# Patient Record
Sex: Male | Born: 1980
Health system: Southern US, Community
[De-identification: ages and names within clinical notes are randomized; demographics above are authoritative.]

## PROBLEM LIST (undated history)

## (undated) DIAGNOSIS — R7303 Prediabetes: Secondary | ICD-10-CM

## (undated) DIAGNOSIS — N63 Unspecified lump in unspecified breast: Secondary | ICD-10-CM

## (undated) DIAGNOSIS — M1990 Unspecified osteoarthritis, unspecified site: Secondary | ICD-10-CM

## (undated) DIAGNOSIS — Z9049 Acquired absence of other specified parts of digestive tract: Secondary | ICD-10-CM

## (undated) DIAGNOSIS — I10 Essential (primary) hypertension: Secondary | ICD-10-CM

## (undated) DIAGNOSIS — F410 Panic disorder [episodic paroxysmal anxiety] without agoraphobia: Secondary | ICD-10-CM

## (undated) DIAGNOSIS — G4730 Sleep apnea, unspecified: Secondary | ICD-10-CM

## (undated) HISTORY — DX: Prediabetes: R73.03

## (undated) HISTORY — DX: Acquired absence of other specified parts of digestive tract: Z90.49

## (undated) HISTORY — DX: Essential (primary) hypertension: I10

## (undated) HISTORY — DX: Panic disorder (episodic paroxysmal anxiety): F41.0

## (undated) HISTORY — DX: Unspecified osteoarthritis, unspecified site: M19.90

---
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
## 2009-01-17 ENCOUNTER — Emergency Department (HOSPITAL_COMMUNITY): Admission: EM | Admit: 2009-01-17 | Discharge: 2009-01-17 | Payer: Self-pay | Admitting: Emergency Medicine

## 2009-12-18 ENCOUNTER — Emergency Department (HOSPITAL_COMMUNITY): Admission: EM | Admit: 2009-12-18 | Discharge: 2009-12-18 | Payer: Self-pay | Admitting: Family Medicine

## 2010-11-04 LAB — POCT I-STAT, CHEM 8
BUN: 11 mg/dL (ref 6–23)
Calcium, Ion: 1.16 mmol/L (ref 1.12–1.32)
Creatinine, Ser: 1.1 mg/dL (ref 0.4–1.5)
Glucose, Bld: 137 mg/dL — ABNORMAL HIGH (ref 70–99)
TCO2: 28 mmol/L (ref 0–100)

## 2015-10-16 ENCOUNTER — Ambulatory Visit (INDEPENDENT_AMBULATORY_CARE_PROVIDER_SITE_OTHER): Payer: Managed Care, Other (non HMO) | Admitting: Family Medicine

## 2015-10-16 VITALS — BP 138/92 | HR 103 | Temp 98.3°F | Resp 17 | Ht 73.0 in | Wt 257.0 lb

## 2015-10-16 DIAGNOSIS — K5900 Constipation, unspecified: Secondary | ICD-10-CM | POA: Diagnosis not present

## 2015-10-16 DIAGNOSIS — Z6833 Body mass index (BMI) 33.0-33.9, adult: Secondary | ICD-10-CM | POA: Diagnosis not present

## 2015-10-16 DIAGNOSIS — Z9049 Acquired absence of other specified parts of digestive tract: Secondary | ICD-10-CM | POA: Diagnosis not present

## 2015-10-16 LAB — COMPREHENSIVE METABOLIC PANEL
ALBUMIN: 4.5 g/dL (ref 3.6–5.1)
ALT: 25 U/L (ref 9–46)
AST: 24 U/L (ref 10–40)
Alkaline Phosphatase: 59 U/L (ref 40–115)
BUN: 11 mg/dL (ref 7–25)
CHLORIDE: 105 mmol/L (ref 98–110)
CO2: 29 mmol/L (ref 20–31)
CREATININE: 1.18 mg/dL (ref 0.60–1.35)
Calcium: 9.8 mg/dL (ref 8.6–10.3)
Glucose, Bld: 93 mg/dL (ref 65–99)
POTASSIUM: 4.2 mmol/L (ref 3.5–5.3)
SODIUM: 141 mmol/L (ref 135–146)
Total Bilirubin: 0.4 mg/dL (ref 0.2–1.2)
Total Protein: 7.9 g/dL (ref 6.1–8.1)

## 2015-10-16 LAB — CBC
HEMATOCRIT: 40.2 % (ref 39.0–52.0)
HEMOGLOBIN: 13.8 g/dL (ref 13.0–17.0)
MCH: 29.1 pg (ref 26.0–34.0)
MCHC: 34.3 g/dL (ref 30.0–36.0)
MCV: 84.8 fL (ref 78.0–100.0)
MPV: 9.2 fL (ref 8.6–12.4)
PLATELETS: 361 10*3/uL (ref 150–400)
RBC: 4.74 MIL/uL (ref 4.22–5.81)
RDW: 14.1 % (ref 11.5–15.5)
WBC: 8.3 10*3/uL (ref 4.0–10.5)

## 2015-10-16 LAB — TSH: TSH: 1.18 m[IU]/L (ref 0.40–4.50)

## 2015-10-16 NOTE — Progress Notes (Signed)
   Subjective:    Patient ID: Joel Marshall, male    DOB: 11-16-1980, 35 y.o.   MRN: [id number]  HPI This is a pleasant 35 year old male that presents today with abdomen pain and headaches since Friday 2/24. Last full BM was also Friday (2/24). He has had small BM's in between till now. He tried colace (saturday and Sunday, 6 pills total) and prune juice with little relief. He also tried Miralax in poweraid with no relief. He tried one 500mg  of tylenol last Saturday with little relief. Appetite has not changed. He reports that he has had 2/3 of his intestine removed when he was [age] years old- states that he [place] unsure why. Reports having these symptoms once last year around May- went to [facility] for Miralax. He has a moderate BM this morning. Denies n/v, chest pain, or SOB.   History reviewed. No pertinent past medical history. No family history on file. Social History   Social History  . Marital Status: Married    Spouse Name: N/A  . Number of Children: N/A  . Years of Education: N/A   Occupational History  . Not on file.   Social History Main Topics  . Smoking status: Never Smoker   . Smokeless tobacco: Not on file  . Alcohol Use: No  . Drug Use: No  . Sexual Activity: No   Other Topics Concern  . Not on file   Social History Narrative  . No narrative on file    Review of Systems  Constitutional: Negative for fever, activity change, appetite change and fatigue.  HENT: Negative for congestion, rhinorrhea, sinus pressure, sneezing and sore throat.   Respiratory: Negative for cough, chest tightness, shortness of breath and wheezing.   Cardiovascular: Negative for chest pain and palpitations.  Gastrointestinal: Positive for abdominal pain (ocassionally), constipation and abdominal distention (little). Negative for nausea and vomiting.  Neurological: Positive for headaches (started last friday 2/24- some eased off some).       Objective:   Physical Exam  Constitutional: He is  oriented to person, place, and time. He appears well-developed and well-nourished.  HENT:  Head: Normocephalic.  Neck: Normal range of motion.  Cardiovascular: Normal rate, regular rhythm and normal heart sounds.   Pulmonary/Chest: Breath sounds normal. No respiratory distress. He has no wheezes.  Abdominal: Soft. He exhibits distension. There is tenderness (small, left side).  Neurological: He is alert and oriented to person, place, and time.  Skin: Skin is warm and dry.  Psychiatric: He has a normal mood and affect. His behavior is normal. Judgment and thought content normal.         BP 138/92 mmHg  Pulse 103  Temp(Src) 98.3 F (36.8 C) (Oral)  Resp 17  Ht 6\' 1"  (1.854 m)  Wt 257 lb (116.574 kg)  BMI 33.91 kg/m2  SpO2 94%  Assessment & Plan:

## 2015-10-16 NOTE — Patient Instructions (Addendum)
For acute constipation- Take 4 Dulcolax. Wait 1 hour then take 4-8 doses of Miralax over 2-3 hours For maintenance use Colace (stool softener, generic fine) and Miralax 1-3 doses daily until you are regulated Add a probiotic daily  If you have had adequate bowel movement in 24 hours, please return to clinic  Constipation, Adult Constipation is when a person has fewer than three bowel movements a week, has difficulty having a bowel movement, or has stools that are dry, hard, or larger than normal. As people grow older, constipation is more common. A low-fiber diet, not taking in enough fluids, and taking certain medicines may make constipation worse.  CAUSES   Certain medicines, such as antidepressants, pain medicine, iron supplements, antacids, and water pills.   Certain diseases, such as diabetes, irritable bowel syndrome (IBS), thyroid disease, or depression.   Not drinking enough water.   Not eating enough fiber-rich foods.   Stress or travel.   Lack of physical activity or exercise.   Ignoring the urge to have a bowel movement.   Using laxatives too much.  SIGNS AND SYMPTOMS   Having fewer than three bowel movements a week.   Straining to have a bowel movement.   Having stools that are hard, dry, or larger than normal.   Feeling full or bloated.   Pain in the lower abdomen.   Not feeling relief after having a bowel movement.  DIAGNOSIS  Your health care provider will take a medical history and perform a physical exam. Further testing may be done for severe constipation. Some tests may include:  A barium enema X-ray to examine your rectum, colon, and, sometimes, your small intestine.   A sigmoidoscopy to examine your lower colon.   A colonoscopy to examine your entire colon. TREATMENT  Treatment will depend on the severity of your constipation and what is causing it. Some dietary treatments include drinking more fluids and eating more fiber-rich foods.  Lifestyle treatments may include regular exercise. If these diet and lifestyle recommendations do not help, your health care provider may recommend taking over-the-counter laxative medicines to help you have bowel movements. Prescription medicines may be prescribed if over-the-counter medicines do not work.  HOME CARE INSTRUCTIONS   Eat foods that have a lot of fiber, such as fruits, vegetables, whole grains, and beans.  Limit foods high in fat and processed sugars, such as french fries, hamburgers, cookies, candies, and soda.   A fiber supplement may be added to your diet if you cannot get enough fiber from foods.   Drink enough fluids to keep your urine clear or pale yellow.   Exercise regularly or as directed by your health care provider.   Go to the restroom when you have the urge to go. Do not hold it.   Only take over-the-counter or prescription medicines as directed by your health care provider. Do not take other medicines for constipation without talking to your health care provider first.  [place] IF:   You have bright red blood in your stool.   Your constipation lasts for more than 4 days or gets worse.   You have abdominal or rectal pain.   You have thin, pencil-like stools.   You have unexplained weight loss. MAKE SURE YOU:   Understand these instructions.  Will watch your condition.  Will get help right away if you are not doing well or get worse.   This information is not intended to replace advice given to you by your  health care provider. Make sure you discuss any questions you have with your health care provider.   Document Released: 05/01/2004 Document Revised: 08/24/2014 Document Reviewed: 05/15/2013 Elsevier Interactive Patient Education [employer].    Why follow it? Research shows. . Those who follow the Mediterranean diet have a reduced risk of heart disease  . The diet is associated with a reduced incidence of  Parkinson's and Alzheimer's diseases . People following the diet may have longer life expectancies and lower rates of chronic diseases  . The Dietary Guidelines for Americans recommends the Mediterranean diet as an eating plan to promote health and prevent disease  What Is the Mediterranean Diet?  . Healthy eating plan based on typical foods and recipes of Mediterranean-style cooking . The diet is primarily a plant based diet; these foods should make up a majority of meals   Starches - Plant based foods should make up a majority of meals - They are an important sources of vitamins, minerals, energy, antioxidants, and fiber - Choose whole grains, foods high in fiber and minimally processed items  - Typical grain sources include wheat, oats, barley, corn, brown rice, bulgar, farro, millet, polenta, couscous  - Various types of beans include chickpeas, lentils, fava beans, black beans, white beans   Fruits  Veggies - Large quantities of antioxidant rich fruits & veggies; 6 or more servings  - Vegetables can be eaten raw or lightly drizzled with oil and cooked  - Vegetables common to the traditional Mediterranean Diet include: artichokes, arugula, beets, broccoli, brussel sprouts, cabbage, carrots, celery, collard greens, cucumbers, eggplant, kale, leeks, lemons, lettuce, mushrooms, okra, onions, peas, peppers, potatoes, pumpkin, radishes, rutabaga, shallots, spinach, sweet potatoes, turnips, zucchini - Fruits common to the Mediterranean Diet include: apples, apricots, avocados, cherries, clementines, dates, figs, grapefruits, grapes, melons, nectarines, oranges, peaches, pears, pomegranates, strawberries, tangerines  Fats - Replace butter and margarine with healthy oils, such as olive oil, canola oil, and tahini  - Limit nuts to no more than a handful a day  - Nuts include walnuts, almonds, pecans, pistachios, pine nuts  - Limit or avoid candied, honey roasted or heavily salted nuts - Olives are  central to the [employer] - can be eaten whole or used in a variety of dishes   Meats Protein - Limiting red meat: no more than a few times a month - When eating red meat: choose lean cuts and keep the portion to the size of deck of cards - Eggs: approx. 0 to 4 times a week  - Fish and lean poultry: at least 2 a week  - Healthy protein sources include, chicken, [place], lean beef, lamb - Increase intake of seafood such as tuna, salmon, trout, mackerel, shrimp, scallops - Avoid or limit high fat processed meats such as sausage and bacon  Dairy - Include moderate amounts of low fat dairy products  - Focus on healthy dairy such as fat free yogurt, skim milk, low or reduced fat cheese - Limit dairy products higher in fat such as whole or 2% milk, cheese, ice cream  Alcohol - Moderate amounts of red wine is ok  - No more than 5 oz daily for women (all ages) and men older than age 39  - No more than 10 oz of wine daily for men younger than 73  Other - Limit sweets and other desserts  - Use herbs and spices instead of salt to flavor foods  - Herbs and spices common to the traditional Mediterranean  Diet include: basil, bay leaves, chives, cloves, cumin, fennel, garlic, lavender, marjoram, mint, oregano, parsley, pepper, rosemary, sage, savory, sumac, tarragon, thyme   It's not just a diet, it's a lifestyle:  . The Mediterranean diet includes lifestyle factors typical of those in the region  . Foods, drinks and meals are best eaten with others and savored . Daily physical activity is important for overall good health . This could be strenuous exercise like running and aerobics . This could also be more leisurely activities such as walking, housework, yard-work, or taking the stairs . Moderation is the key; a balanced and healthy diet accommodates most foods and drinks . Consider portion sizes and frequency of consumption of certain foods   Meal Ideas & Options:  . Breakfast:  o Whole wheat  toast or whole wheat English muffins with peanut butter & hard boiled egg o Steel cut oats topped with apples & cinnamon and skim milk  o Fresh fruit: banana, strawberries, melon, berries, peaches  o Smoothies: strawberries, bananas, greek yogurt, peanut butter o Low fat greek yogurt with blueberries and granola  o Egg white omelet with spinach and mushrooms o Breakfast couscous: whole wheat couscous, apricots, skim milk, cranberries  . Sandwiches:  o Hummus and grilled vegetables (peppers, zucchini, squash) on whole wheat bread   o Grilled chicken on whole wheat pita with lettuce, tomatoes, cucumbers or tzatziki  o Tuna salad on whole wheat bread: tuna salad made with greek yogurt, olives, red peppers, capers, green onions o Garlic rosemary lamb pita: lamb sauted with garlic, rosemary, salt & pepper; add lettuce, cucumber, greek yogurt to pita - flavor with lemon juice and black pepper  . Seafood:  o Mediterranean grilled salmon, seasoned with garlic, basil, parsley, lemon juice and black pepper o Shrimp, lemon, and spinach whole-grain pasta salad made with low fat greek yogurt  o Seared scallops with lemon orzo  o Seared tuna steaks seasoned salt, pepper, coriander topped with tomato mixture of olives, tomatoes, olive oil, minced garlic, parsley, green onions and cappers  . Meats:  o Herbed greek chicken salad with kalamata olives, cucumber, feta  o Red bell peppers stuffed with spinach, bulgur, lean ground beef (or lentils) & topped with feta   o Kebabs: skewers of chicken, tomatoes, onions, zucchini, squash  o [place] burgers: made with red onions, mint, dill, lemon juice, feta cheese topped with roasted red peppers . Vegetarian o Cucumber salad: cucumbers, artichoke hearts, celery, red onion, feta cheese, tossed in olive oil & lemon juice  o Hummus and whole grain pita points with a greek salad (lettuce, tomato, feta, olives, cucumbers, red onion) o Lentil soup with celery, carrots made  with vegetable broth, garlic, salt and pepper  o Tabouli salad: parsley, bulgur, mint, scallions, cucumbers, tomato, radishes, lemon juice, olive oil, salt and pepper.

## 2015-10-16 NOTE — Progress Notes (Signed)
Subjective:    Patient ID: Joel Marshall, male    DOB: 1980/09/23, 35 y.o.   MRN: [id number]  HPI This is a pleasant 35 yo male who presents today with recurrent constipation. He does not have regular medical care. He has felt constipated and had a headache for 5 days. He has had small amounts of stool with a "moderate" stool today. He had a previous episode for which he was seen at [facility] and treated with Miralax with relief. He has takes colace (6 tabs total) and 1 dose of MIralax. He has had intermittent abdominal pain and bloating. He had some nausea this morning that was relieved by eating a [employer] biscuit. He has not had any blood in stool, with wiping or dark stools. 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 He is not sure why. He requests referral to GI to discuss his history and for his constipation.   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 He was unemployed for 2 years and gained a lot of weight. He doesn't exercise regularly and eats fast food 1-2 times per day.   History reviewed. No pertinent past medical history. Past Surgical History  Procedure Laterality Date  . Small intestine surgery     No family history on file. Social History  Substance Use Topics  . Smoking status: Never Smoker   . Smokeless tobacco: None  . Alcohol Use: No   Review of Systems  Constitutional: Negative for fever, appetite change and fatigue.  Respiratory: Negative for cough and shortness of breath.   Cardiovascular: Negative for chest pain.  Gastrointestinal: Positive for nausea, abdominal pain, constipation and abdominal distention. Negative for vomiting, diarrhea, blood in stool and anal bleeding.  Neurological: Positive for headaches (improving).       Objective:   Physical Exam  Constitutional: He is oriented to person, place, and time. He appears well-developed and well-nourished. No distress.  HENT:  Head: Normocephalic and atraumatic.  Eyes: Conjunctivae are  normal.  Cardiovascular: Normal rate, regular rhythm and normal heart sounds.   Pulmonary/Chest: Effort normal and breath sounds normal.  Abdominal: Soft. Bowel sounds are normal. He exhibits distension (mild). There is tenderness (slight, right sided with deep palpation). There is no rebound and no guarding.  He has a long, well healed scar across his mid abdomen.   Musculoskeletal: Normal range of motion.  Neurological: He is alert and oriented to person, place, and time.  Skin: Skin is warm and dry. He is not diaphoretic.  Vitals reviewed.     BP 138/92 mmHg  Pulse 103  Temp(Src) 98.3 F (36.8 C) (Oral)  Resp 17  Ht 6\' 1"  (1.854 m)  Wt 257 lb (116.574 kg)  BMI 33.91 kg/m2  SpO2 94% Wt Readings from Last 3 Encounters:  10/16/15 257 lb (116.574 kg)   Depression screen PHQ 2/9 10/16/2015  Decreased Interest 0  Down, Depressed, Hopeless 0  PHQ - 2 Score 0         Assessment & Plan:  1. Constipation, unspecified constipation type - CBC - Comprehensive metabolic panel - TSH - Ambulatory referral to Gastroenterology- per patient request - Provided verbal and written instructions for dulcolax/miralax - encouraged increased water, fiber  2. History of intestine removal - Ambulatory referral to Gastroenterology- per patient request  3. BMI 33.0-33.9,adult - provided information about the Mediterranean Diet - encouraged avoidance of fast food, increased vegetables and fruits   Clarene Reamer, FNP-BC  [facility]  10/18/2015 8:56 AM

## 2015-10-18 ENCOUNTER — Encounter: Payer: Self-pay | Admitting: Internal Medicine

## 2015-10-18 ENCOUNTER — Encounter: Payer: Self-pay | Admitting: Family Medicine

## 2015-11-20 ENCOUNTER — Ambulatory Visit: Payer: Self-pay | Admitting: Internal Medicine

## 2016-01-21 ENCOUNTER — Ambulatory Visit (INDEPENDENT_AMBULATORY_CARE_PROVIDER_SITE_OTHER): Payer: Managed Care, Other (non HMO) | Admitting: Internal Medicine

## 2016-01-21 ENCOUNTER — Encounter: Payer: Self-pay | Admitting: Internal Medicine

## 2016-01-21 VITALS — BP 124/80 | HR 72 | Ht 72.5 in | Wt 250.0 lb

## 2016-01-21 DIAGNOSIS — R1030 Lower abdominal pain, unspecified: Secondary | ICD-10-CM | POA: Diagnosis not present

## 2016-01-21 DIAGNOSIS — K5909 Other constipation: Secondary | ICD-10-CM

## 2016-01-21 DIAGNOSIS — K219 Gastro-esophageal reflux disease without esophagitis: Secondary | ICD-10-CM

## 2016-01-21 NOTE — Progress Notes (Signed)
HISTORY OF PRESENT ILLNESS:  Joel Marshall is a 35 y.o. male who is self-referred with a chief complaint of "wanting to know what happened to me when I was a child". According to the patient he underwent abdominal surgery age 62. He was told by his parents that he had section of small intestines and the appendix removed. He states that they gave him no additional information. He denies meeting to have been seen by physicians as child thereafter. His only GI complaint is that of constipation once every 1-2 years. Sometimes associated with abdominal discomfort which is probably relieved when constipation is relieved. Recently tried MiraLAX with good results. Finally, he does have occasional indigestion and heartburn with dietary indiscretion. No dysphagia. Weight has been stable  REVIEW OF SYSTEMS:  All non-GI ROS negative except for sinus and allergy, sleeping problems  Past Medical History  Diagnosis Date  . History of intestine removal   . Borderline diabetes     Past Surgical History  Procedure Laterality Date  . Small intestine surgery      Social History Joel Marshall  reports that he has never smoked. He does not have any smokeless tobacco history on file. He reports that he does not drink alcohol or use illicit drugs.  family history includes Joel Marshall in his brother; Hypertension in his father; Rheum arthritis in his mother.  No Known Allergies     PHYSICAL EXAMINATION: Vital signs: BP 124/80 mmHg  Pulse 72  Ht 6' 0.5" (1.842 m)  Wt 250 lb (113.399 kg)  BMI 33.42 kg/m2  Constitutional: Doesn't, obese, generally well-appearing, no acute distress Psychiatric: alert and oriented x3, cooperative Eyes: extraocular movements intact, anicteric, conjunctiva pink Mouth: oral pharynx moist, no lesions Neck: supple no lymphadenopathy Cardiovascular: heart regular rate and rhythm, no murmur Lungs: clear to auscultation bilaterally Abdomen: soft, nontender, nondistended, no obvious ascites, no  peritoneal signs, normal bowel sounds, no organomegaly. Large transverse abdominal incision above the umbilicus well-healed without megaly Lymph: Rectal: Omitted obese, Extremities: no clubbing cyanosis or lower extremity edema bilaterally Skin: no lesions on visible extremities Neuro: No focal deficits. Cranial nerves intact  ASSESSMENT:  #1. Abdominal surgery as a child. Type unknown. No obvious sequelae #2. Infrequent constipation responding to laxatives #3. Infrequent abdominal pain associated with constipation believed with defecation #4. Mild GERD without alarm features #5. Obesity   PLAN:  #1. May not be possible as somebody years past, and advised him to reach out to the hospital that performed his surgery for more information is available #2. MiraLAX as needed #3. Reflux precautions with attention to weight loss #4. GI follow-up as needed. Resume general medical care with PCP Dr. Criss Rosales

## 2016-01-21 NOTE — Patient Instructions (Signed)
Please follow up as needed 

## 2016-04-03 DIAGNOSIS — K5901 Slow transit constipation: Secondary | ICD-10-CM | POA: Diagnosis not present

## 2016-04-03 DIAGNOSIS — Z6832 Body mass index (BMI) 32.0-32.9, adult: Secondary | ICD-10-CM | POA: Diagnosis not present

## 2016-04-03 DIAGNOSIS — E6609 Other obesity due to excess calories: Secondary | ICD-10-CM | POA: Diagnosis not present

## 2016-04-03 DIAGNOSIS — R7309 Other abnormal glucose: Secondary | ICD-10-CM | POA: Diagnosis not present

## 2016-08-07 DIAGNOSIS — N62 Hypertrophy of breast: Secondary | ICD-10-CM | POA: Diagnosis not present

## 2016-08-07 DIAGNOSIS — F064 Anxiety disorder due to known physiological condition: Secondary | ICD-10-CM | POA: Diagnosis not present

## 2016-08-12 ENCOUNTER — Emergency Department (HOSPITAL_COMMUNITY)
Admission: EM | Admit: 2016-08-12 | Discharge: 2016-08-13 | Disposition: A | Discharge status: Home / Self Care | Payer: 59 | Attending: Emergency Medicine | Admitting: Emergency Medicine

## 2016-08-12 ENCOUNTER — Encounter (HOSPITAL_COMMUNITY): Payer: Self-pay | Admitting: Emergency Medicine

## 2016-08-12 ENCOUNTER — Emergency Department (HOSPITAL_COMMUNITY): Payer: 59

## 2016-08-12 DIAGNOSIS — Z79899 Other long term (current) drug therapy: Secondary | ICD-10-CM | POA: Insufficient documentation

## 2016-08-12 DIAGNOSIS — R002 Palpitations: Secondary | ICD-10-CM | POA: Diagnosis not present

## 2016-08-12 DIAGNOSIS — R079 Chest pain, unspecified: Secondary | ICD-10-CM | POA: Diagnosis not present

## 2016-08-12 LAB — CBC
HCT: 37.2 % — ABNORMAL LOW (ref 39.0–52.0)
Hemoglobin: 12.7 g/dL — ABNORMAL LOW (ref 13.0–17.0)
MCH: 28.1 pg (ref 26.0–34.0)
MCHC: 34.1 g/dL (ref 30.0–36.0)
MCV: 82.3 fL (ref 78.0–100.0)
Platelets: 345 10*3/uL (ref 150–400)
RBC: 4.52 MIL/uL (ref 4.22–5.81)
RDW: 13.1 % (ref 11.5–15.5)
WBC: 6.1 10*3/uL (ref 4.0–10.5)

## 2016-08-12 LAB — I-STAT TROPONIN, ED
TROPONIN I, POC: 0 ng/mL (ref 0.00–0.08)
Troponin i, poc: 0 ng/mL (ref 0.00–0.08)

## 2016-08-12 LAB — BASIC METABOLIC PANEL
Anion gap: 10 (ref 5–15)
BUN: 13 mg/dL (ref 6–20)
CO2: 28 mmol/L (ref 22–32)
Calcium: 9.3 mg/dL (ref 8.9–10.3)
Chloride: 102 mmol/L (ref 101–111)
Creatinine, Ser: 1.12 mg/dL (ref 0.61–1.24)
GFR calc Af Amer: >60 mL/min (ref >60)
GFR calc non Af Amer: >60 mL/min (ref >60)
Glucose, Bld: 143 mg/dL — ABNORMAL HIGH (ref 65–99)
Potassium: 3.5 mmol/L (ref 3.5–5.1)
Sodium: 140 mmol/L (ref 135–145)

## 2016-08-12 MED ORDER — HYDROCHLOROTHIAZIDE 12.5 MG PO CAPS
12.5000 mg | ORAL_CAPSULE | Freq: Once | ORAL | Status: AC
  Administered 2016-08-12: 12.5 mg via ORAL
  Filled 2016-08-12: qty 1

## 2016-08-12 MED ORDER — HYDROCHLOROTHIAZIDE 25 MG PO TABS: 25.0000 mg | ORAL_TABLET | Freq: Every day | ORAL | 0 refills | Status: DC

## 2016-08-12 NOTE — Discharge Instructions (Signed)
Please call the cardiology clinic listed in the morning to schedule a follow up appointment. You will likely need an outpatient echocardiogram. I have also started you on blood pressure medication. Please take this daily as directed. Return to ER for chest pain, shortness of breath, new or worsening symptoms, any additional concerns.

## 2016-08-12 NOTE — ED Triage Notes (Addendum)
Patient c/o left sided chest tightness since 1100 today. Denies radiation. Patient states "it feels like my heart is beating out of my chest." Patient reports shortness of breath but denies nausea, vomiting, and dizziness.

## 2016-08-12 NOTE — ED Provider Notes (Signed)
[name] DEPT Provider Note   CSN: [id number] Arrival date & time: 08/12/16  1420   By signing my name below, I, Joel Marshall, attest that this documentation has been prepared under the direction and in the presence of [facility], PA-C. Electronically Signed: Eunice Marshall, Scribe. 08/13/16. 2:03 AM.   History   Chief Complaint Chief Complaint  Patient presents with  . Chest Pain   The history is provided by the patient and medical records. No language interpreter was used.    HPI Comments: Joel Marshall is an otherwise health 35 y.o. male who presents to the Emergency Department complaining of intermittent heart palpitations with associated tremors and shortness of breath. First episode occurred at approximately 11 am this morning and lasted appox. 5 minutes then self-resolved. He then went on with his day as usual with no complaints. Around 1pm he had another episode of palpitations and shortness of breath last longer, appox. 10-15 minutes. He then came to ED for further evaluation. While in the waiting room, he had 4-5 similar episodes which were much shorter, 1-2 minutes each. He was told he had "borderline" HTN at one time, but never diagnosed with HTN or been on blood pressure medications. No hx of heart disease, HLD. Not a smoker. No family cardiac history. No long travel, recent surgeries or immobilization. Pt denies chest pain, nausea, vomiting and dizziness, abdominal pain. recent long distance travel and diaphoresis.   Past Medical History:  Diagnosis Date  . Borderline diabetes   . History of intestine removal     There are no active problems to display for this patient.   Past Surgical History:  Procedure Laterality Date  . SMALL INTESTINE SURGERY         Home Medications    Prior to Admission medications   Medication Sig Start Date End Date Taking? Authorizing Provider  polyethylene glycol (MIRALAX / GLYCOLAX) packet Take 17 g by mouth daily.   Yes  Historical Provider, MD  hydrochlorothiazide (HYDRODIURIL) 25 MG tablet Take 1 tablet (25 mg total) by mouth daily. 08/12/16   Joel Almond Ellen Goris, PA-C    Family History Family History  Problem Relation Age of Onset  . Rheum arthritis Mother   . Hypertension Father   . Breast cancer      cousin  . Colon cancer      cousin  . HIV Brother     Social History Social History  Substance Use Topics  . Smoking status: Never Smoker  . Smokeless tobacco: Never Used  . Alcohol use No     Allergies   Patient has no known allergies.   Review of Systems Review of Systems  Constitutional: Negative for diaphoresis.  Respiratory: Positive for shortness of breath. Negative for cough.   Cardiovascular: Positive for palpitations. Negative for chest pain and leg swelling.  Gastrointestinal: Negative for diarrhea, nausea and vomiting.  All other systems reviewed and are negative.    Physical Exam Updated Vital Signs BP (!) 139/108 (BP Location: Left Arm)   Pulse 70   Temp 98.7 F (37.1 C) (Oral)   Resp 12   Ht 6\' 1"  (1.854 m)   Wt 114.8 kg   SpO2 99%   BMI 33.38 kg/m   Physical Exam  Constitutional: He is oriented to person, place, and time. He appears well-developed and well-nourished. No distress.  HENT:  Head: Normocephalic and atraumatic.  Cardiovascular: Normal rate, regular rhythm, normal heart sounds and intact distal pulses.   No murmur heard.  Pulmonary/Chest: Effort normal and breath sounds normal. No respiratory distress. He has no wheezes. He has no rales.  Speaking in full sentences.   Abdominal: Soft. He exhibits no distension. There is no tenderness.  Musculoskeletal: He exhibits no edema.  Neurological: He is alert and oriented to person, place, and time.  Skin: Skin is warm and dry.  Nursing note and vitals reviewed.    ED Treatments / Results  DIAGNOSTIC STUDIES: Oxygen Saturation is 99% on RA, normal by my interpretation.    COORDINATION OF  CARE: 2:03 AM Discussed treatment plan with pt at bedside and pt agreed to plan.  Labs (all labs ordered are listed, but only abnormal results are displayed) Labs Reviewed  BASIC METABOLIC PANEL - Abnormal; Notable for the following:       Result Value   Glucose, Bld 143 (*)    All other components within normal limits  CBC - Abnormal; Notable for the following:    Hemoglobin 12.7 (*)    HCT 37.2 (*)    All other components within normal limits  Joel Marshall, ED  Joel Marshall, ED    EKG  EKG Interpretation  Date/Time:  Wednesday August 12 2016 14:30:02 EST Ventricular Rate:  88 PR Interval:    QRS Duration: 89 QT Interval:  378 QTC Calculation: 458 R Axis:   65 Text Interpretation:  Sinus rhythm Left ventricular hypertrophy Artifact Confirmed by Joel Muskrat  MD [phone]) on 08/12/2016 10:51:21 PM       Radiology Dg Chest 2 View  Result Date: 08/12/2016 CLINICAL DATA:  Chest pain EXAM: CHEST  2 VIEW COMPARISON:  None. FINDINGS: The heart size and mediastinal contours are within normal limits. Both lungs are clear. The visualized skeletal structures are unremarkable. IMPRESSION: No active cardiopulmonary disease. Electronically Signed   By: Joel Marshall M.D.   On: 08/12/2016 14:56    Procedures Procedures (including critical care time)  Medications Ordered in ED Medications  hydrochlorothiazide (MICROZIDE) capsule 12.5 mg (12.5 mg Oral Given 08/12/16 2303)     Initial Impression / Assessment and Plan / ED Course  I have reviewed the triage vital signs and the nursing notes.  Pertinent labs & imaging results that were available during my care of the patient were reviewed by me and considered in my medical decision making (see chart for details).  Clinical Course    Joel Marshall is a 35 y.o. male who presents to ED for intermittent palpitations associated with shortness of breath that began today. Patient hypertensive in ED today. BP initially 161/132 upon  arrival. Patient has no known history of hypertension. He has no known medical problems and takes no daily medications. Heart score of 2. PERC negative. EKG reviewed with LVH. Chest x-ray negative. CBC, BMP reassuring. Troponin negative 2.  Referral to cardiology outpatient given. Stressed the importance of following up with cardiology and will likely need outpatient echo. Started patient on HCTZ for blood pressure in ED and Rx given. Reasons to return to the ED were discussed and all questions answered.  Patient seen by and discussed with Dr. Vanita Panda who agrees with treatment plan.   Final Clinical Impressions(s) / ED Diagnoses   Final diagnoses:  Palpitations    New Prescriptions Discharge Medication List as of 08/12/2016 11:59 PM    START taking these medications   Details  hydrochlorothiazide (HYDRODIURIL) 25 MG tablet Take 1 tablet (25 mg total) by mouth daily., Starting Wed 08/12/2016, Print       I personally  performed the services described in this documentation, which was scribed in my presence. The recorded information has been reviewed and is accurate.    [facility] Si Jachim, PA-C 08/13/16 0205    Joel Muskrat, MD 08/14/16 [phone]

## 2016-08-13 MED FILL — HYDROCHLOROTHIAZIDE 25 MG T: 25 | 30 days supply | Qty: 30 | Fill #0

## 2016-08-14 ENCOUNTER — Emergency Department: Payer: 59

## 2016-08-14 ENCOUNTER — Emergency Department
Admission: EM | Admit: 2016-08-14 | Discharge: 2016-08-14 | Disposition: A | Discharge status: Home / Self Care | Payer: 59 | Attending: Emergency Medicine | Admitting: Emergency Medicine

## 2016-08-14 DIAGNOSIS — Z79899 Other long term (current) drug therapy: Secondary | ICD-10-CM | POA: Diagnosis not present

## 2016-08-14 DIAGNOSIS — F410 Panic disorder [episodic paroxysmal anxiety] without agoraphobia: Secondary | ICD-10-CM | POA: Diagnosis not present

## 2016-08-14 DIAGNOSIS — F419 Anxiety disorder, unspecified: Secondary | ICD-10-CM | POA: Insufficient documentation

## 2016-08-14 DIAGNOSIS — R002 Palpitations: Secondary | ICD-10-CM | POA: Diagnosis not present

## 2016-08-14 LAB — CBC
HCT: 39.1 % — ABNORMAL LOW (ref 40.0–52.0)
Hemoglobin: 13.8 g/dL (ref 13.0–18.0)
MCH: 29.7 pg (ref 26.0–34.0)
MCHC: 35.4 g/dL (ref 32.0–36.0)
MCV: 84 fL (ref 80.0–100.0)
Platelets: 321 10*3/uL (ref 150–440)
RBC: 4.66 MIL/uL (ref 4.40–5.90)
RDW: 13.7 % (ref 11.5–14.5)
WBC: 8.5 10*3/uL (ref 3.8–10.6)

## 2016-08-14 LAB — BASIC METABOLIC PANEL
Anion gap: 9 (ref 5–15)
BUN: 13 mg/dL (ref 6–20)
CO2: 31 mmol/L (ref 22–32)
Calcium: 9.7 mg/dL (ref 8.9–10.3)
Chloride: 97 mmol/L — ABNORMAL LOW (ref 101–111)
Creatinine, Ser: 1.32 mg/dL — ABNORMAL HIGH (ref 0.61–1.24)
GFR calc Af Amer: >60 mL/min (ref >60)
GFR calc non Af Amer: >60 mL/min (ref >60)
Glucose, Bld: 87 mg/dL (ref 65–99)
Potassium: 3.6 mmol/L (ref 3.5–5.1)
Sodium: 137 mmol/L (ref 135–145)

## 2016-08-14 LAB — TROPONIN I

## 2016-08-14 MED ORDER — LORAZEPAM 1 MG PO TABS
1.0000 mg | ORAL_TABLET | Freq: Two times a day (BID) | ORAL | 0 refills | Status: DC
Start: 2016-08-14 — End: 2016-11-09

## 2016-08-14 MED ORDER — DIAZEPAM 5 MG PO TABS
10.0000 mg | ORAL_TABLET | Freq: Once | ORAL | Status: AC
  Administered 2016-08-14: 10 mg via ORAL

## 2016-08-14 MED ORDER — DIAZEPAM 5 MG PO TABS
ORAL_TABLET | ORAL | Status: AC
  Administered 2016-08-14: 10 mg via ORAL
  Filled 2016-08-14: qty 2

## 2016-08-14 NOTE — ED Provider Notes (Signed)
[facility] Emergency Department Provider Note        Time seen: ----------------------------------------- 3:18 PM on 08/14/2016 -----------------------------------------    I have reviewed the triage vital signs and the nursing notes.   HISTORY  Chief Complaint Palpitations    HPI Joel Marshall is a 35 y.o. male who presents to the ER for palpitations that started on Wednesday. Patient was seen and treated at [facility] long emergency department. He was told to follow-up with cardiology. He states his appointment is this month. Patient starts bobbing and bobbing his head which are somewhat of palpitations that he describes. Patient thinks his heart is beating so hard and fast that is causing his head above. He presents without arrhythmia or tachycardia. Patient states she is under a lot of stress with a conversion at his job and he recently had some financial issues.   Past Medical History:  Diagnosis Date  . Borderline diabetes   . History of intestine removal     There are no active problems to display for this patient.   Past Surgical History:  Procedure Laterality Date  . SMALL INTESTINE SURGERY      Allergies Patient has no known allergies.  Social History Social History  Substance Use Topics  . Smoking status: Never Smoker  . Smokeless tobacco: Never Used  . Alcohol use No    Review of Systems Constitutional: Negative for fever. Cardiovascular: Negative for chest pain.Positive for palpitations Respiratory: Negative for shortness of breath. Gastrointestinal: Negative for abdominal pain, vomiting and diarrhea. Genitourinary: Negative for dysuria. Musculoskeletal: Negative for back pain. Skin: Negative for rash. Neurological: Negative for headaches, positive for weakness Psychiatric: Positive for anxiety  10-point ROS otherwise negative.  ____________________________________________   PHYSICAL EXAM:  VITAL SIGNS: ED Triage Vitals   Enc Vitals Group     BP 08/14/16 1221 (!) 134/94     Pulse Rate 08/14/16 1221 79     Resp 08/14/16 1221 18     Temp 08/14/16 1221 98.1 F (36.7 C)     Temp Source 08/14/16 1221 Oral     SpO2 08/14/16 1221 99 %     Weight 08/14/16 1222 253 lb (114.8 kg)     Height 08/14/16 1222 6\' 1"  (1.854 m)     Head Circumference --      Peak Flow --      Pain Score 08/14/16 1222 10     Pain Loc --      Pain Edu? --      Excl. in [place]? --     Constitutional: Alert and oriented. Anxious, no distress Eyes: Conjunctivae are normal. PERRL. Normal extraocular movements. ENT   Head: Normocephalic and atraumatic.   Nose: No congestion/rhinnorhea.   Mouth/Throat: Mucous membranes are moist.   Neck: No stridor. Cardiovascular: Normal rate, regular rhythm. No murmurs, rubs, or gallops. Respiratory: Normal respiratory effort without tachypnea nor retractions. Breath sounds are clear and equal bilaterally. No wheezes/rales/rhonchi. Gastrointestinal: Soft and nontender. Normal bowel sounds Musculoskeletal: Nontender with normal range of motion in all extremities. No lower extremity tenderness nor edema. Neurologic:  Normal speech and language. No gross focal neurologic deficits are appreciated.  Skin:  Skin is warm, dry and intact. No rash noted. Psychiatric: Depressed mood and affect ____________________________________________  EKG: Interpreted by me. Normal sinus rhythm with a rate of 82 bpm, normal PR interval, normal QRS, normal QT, normal axis. Nonspecific ST changes  ____________________________________________  ED COURSE:  Pertinent labs & imaging results that were available  during my care of the patient were reviewed by me and considered in my medical decision making (see chart for details). Clinical Course   Patient presents to ER clinically with anxiety. We will assess with labs and imaging.  Procedures ____________________________________________   LABS (pertinent  positives/negatives)  Labs Reviewed  BASIC METABOLIC PANEL - Abnormal; Notable for the following:       Result Value   Chloride 97 (*)    Creatinine, Ser 1.32 (*)    All other components within normal limits  CBC - Abnormal; Notable for the following:    HCT 39.1 (*)    All other components within normal limits  TROPONIN I    RADIOLOGY Chest x-ray Within normal limits  ____________________________________________  FINAL ASSESSMENT AND PLAN  Palpitations, panic attack  Plan: Patient with labs and imaging as dictated above. Patient is in no distress, currently feeling better after Valium. Be discharged with Ativan and he is encouraged to have close outpatient follow-up with his doctor.   Earleen Newport, MD   Note: This dictation was prepared with Dragon dictation. Any transcriptional errors that result from this process are unintentional    Earleen Newport, MD 08/14/16 [phone]

## 2016-08-14 NOTE — ED Notes (Addendum)
Pt reports was at work Wednesday when shaking began. Pt reports recently experienced work stress and feels anxious. Reports recently seen at PCP and was told may have high blood pressure. PT reports that also makes him anxious.

## 2016-08-14 NOTE — ED Triage Notes (Signed)
Pt states that he started having palpitations on Wednesday, was seen and treated at [facility]. Pt was told to follow up with cardiology, states his appt jan 20th. Pt's head is bobbing while seated in triage, this RN asked why his head was moving like that, pt states that "my heart is beating so hard and fast it is causing my head to do this" pt's heart rate is 84, movement of head is much faster than heart rate, pt reassured that his heart rate is regular. Pt stating in triage that he has been under a lot of stress with a conversion at his job. No distress noted

## 2016-08-18 DIAGNOSIS — I10 Essential (primary) hypertension: Secondary | ICD-10-CM | POA: Diagnosis not present

## 2016-08-19 ENCOUNTER — Ambulatory Visit (INDEPENDENT_AMBULATORY_CARE_PROVIDER_SITE_OTHER): Payer: 59 | Admitting: Cardiovascular Disease

## 2016-08-19 ENCOUNTER — Encounter: Payer: Self-pay | Admitting: Cardiovascular Disease

## 2016-08-19 VITALS — BP 138/90 | HR 98 | Ht 73.0 in | Wt 250.0 lb

## 2016-08-19 DIAGNOSIS — R002 Palpitations: Secondary | ICD-10-CM | POA: Diagnosis not present

## 2016-08-19 DIAGNOSIS — G4733 Obstructive sleep apnea (adult) (pediatric): Secondary | ICD-10-CM

## 2016-08-19 DIAGNOSIS — G4730 Sleep apnea, unspecified: Secondary | ICD-10-CM

## 2016-08-19 DIAGNOSIS — I10 Essential (primary) hypertension: Secondary | ICD-10-CM | POA: Insufficient documentation

## 2016-08-19 DIAGNOSIS — Z13220 Encounter for screening for lipoid disorders: Secondary | ICD-10-CM

## 2016-08-19 NOTE — Assessment & Plan Note (Signed)
Patient hypertensive blood pressure measured 138/90. The measured higher than this at his PCPs office. He is under a lot of stress lately. His hydrochlorothiazide was recently decreased from 25 mg at 12-1/2 mg a day and he was begun on Bystolic 5 mg a day. Continue current meds at current dosing

## 2016-08-19 NOTE — Assessment & Plan Note (Signed)
Joel Marshall has symptoms of obstructive sleep apnea and snores according to his wife. He is moderately overweight. This may contribute to his hypertension and palpitations as well. We will obtain an outpatient sleep study to further evaluate

## 2016-08-19 NOTE — Progress Notes (Signed)
08/19/2016 Joel Marshall   [birth year]  [id number]  Primary Physician Elyn Peers, MD Primary Cardiologist: Lorretta Harp MD Renae Gloss  HPI:  [name] is a very pleasant 36 year old mildly overweight married African-American male father of one child who is accompanied by his wife Joel Marshall. He was referred for new onset palpitations and hypertension. He apparently was seen in the emergency room on 1227 and again 2 days later for symptomatic palpitations. Of note, his copy was brought by another company 8 days after Thanksgiving and he's had some issues with transition including financial issues which have caused a significant amount of anxiety and stress. After this, he started to notice palpitations and was seen in the emergency room where EKG showed no acute changes and labs were unremarkable. His blood pressure has been up as well. He admits to drinking 2 cups of coffee a day. There is no family history. He says he is prediabetic. His PCP recently decreased his hydrochlorothiazide from 25 mg daily to 12-1/2 mg daily and began him on Bystolic. He also describes symptoms compatible with obstructive sleep apnea.   Current Outpatient Prescriptions  Medication Sig Dispense Refill  . hydrochlorothiazide (HYDRODIURIL) 25 MG tablet Take 1 tablet (25 mg total) by mouth daily. (Patient taking differently: Take 12.5 mg by mouth daily. ) 30 tablet 0  . LORazepam (ATIVAN) 1 MG tablet Take 1 tablet (1 mg total) by mouth 2 (two) times daily. 20 tablet 0  . polyethylene glycol (MIRALAX / GLYCOLAX) packet Take 17 g by mouth daily.     No current facility-administered medications for this visit.     No Known Allergies  Social History   Social History  . Marital status: Married    Spouse name: N/A  . Number of children: 1  . Years of education: N/A   Occupational History  . IT technician    Social History Main Topics  . Smoking status: Never Smoker  . Smokeless tobacco: Never  Used  . Alcohol use No  . Drug use: No  . Sexual activity: No   Other Topics Concern  . Not on file   Social History Narrative  . No narrative on file     Review of Systems: General: negative for chills, fever, night sweats or weight changes.  Cardiovascular: negative for chest pain, dyspnea on exertion, edema, orthopnea, palpitations, paroxysmal nocturnal dyspnea or shortness of breath Dermatological: negative for rash Respiratory: negative for cough or wheezing Urologic: negative for hematuria Abdominal: negative for nausea, vomiting, diarrhea, bright red blood per rectum, melena, or hematemesis Neurologic: negative for visual changes, syncope, or dizziness All other systems reviewed and are otherwise negative except as noted above.    Blood pressure 138/90, pulse 98, height 6\' 1"  (1.854 m), weight 250 lb (113.4 kg).  General appearance: alert and no distress Neck: no adenopathy, no carotid bruit, no JVD, supple, symmetrical, trachea midline and thyroid not enlarged, symmetric, no tenderness/mass/nodules Lungs: clear to auscultation bilaterally Heart: regular rate and rhythm, S1, S2 normal, no murmur, click, rub or gallop Extremities: extremities normal, atraumatic, no cyanosis or edema  EKG not performed today  ASSESSMENT AND PLAN:   Essential hypertension Patient hypertensive blood pressure measured 138/90. The measured higher than this at his PCPs office. He is under a lot of stress lately. His hydrochlorothiazide was recently decreased from 25 mg at 12-1/2 mg a day and he was begun on Bystolic 5 mg a day. Continue current meds at current dosing  Palpitations [name] was referred for palpitations. This began shortly after his company was brought out and he's had some issues with cash flow especially prior to Christmas. These palpitations prompted ER visits on 1227 and 1229. His EKG showed no acute changes and his labs were benign. I suspect these are all anxiety driven.  He is on some Ativan. We will recommend decrease caffeine intake. I'm also going to get a outpatient sleep study which may be contributory although I suspect most of this is lifestyle mediated. I will see him back in 6 weeks for follow-up.  Obstructive sleep apnea [name] has symptoms of obstructive sleep apnea and snores according to his wife. He is moderately overweight. This may contribute to his hypertension and palpitations as well. We will obtain an outpatient sleep study to further evaluate      Lorretta Harp MD [facility], [facility] 08/19/2016 8:47 AM

## 2016-08-19 NOTE — Patient Instructions (Signed)
Medication Instructions: Your physician recommends that you continue on your current medications as directed. Please refer to the Current Medication list given to you today.  Labwork: Your physician recommends that you return for a FASTING lipid profile and hepatic function panel.   Testing/Procedures: Your physician has recommended that you have a sleep study. This test records several body functions during sleep, including: brain activity, eye movement, oxygen and carbon dioxide blood levels, heart rate and rhythm, breathing rate and rhythm, the flow of air through your mouth and nose, snoring, body muscle movements, and chest and belly movement.  Follow-Up: Your physician recommends that you schedule a follow-up appointment in: 6 weeks with Dr. Gwenlyn Found.   Any Other Special Instructions will be listed below:   Heart-Healthy Eating Plan Introduction Heart-healthy meal planning includes:  Limiting unhealthy fats.  Increasing healthy fats.  Making other small dietary changes. You may need to talk with your doctor or a diet specialist (dietitian) to create an eating plan that is right for you. What types of fat should I choose?  Choose healthy fats. These include olive oil and canola oil, flaxseeds, walnuts, almonds, and seeds.  Eat more omega-3 fats. These include salmon, mackerel, sardines, tuna, flaxseed oil, and ground flaxseeds. Try to eat fish at least twice each week.  Limit saturated fats.  Saturated fats are often found in animal products, such as meats, butter, and cream.  Plant sources of saturated fats include palm oil, palm kernel oil, and coconut oil.  Avoid foods with partially hydrogenated oils in them. These include stick margarine, some tub margarines, cookies, crackers, and other baked goods. These contain trans fats. What general guidelines do I need to follow?  Check food labels carefully. Identify foods with trans fats or high amounts of saturated  fat.  Fill one half of your plate with vegetables and green salads. Eat 4-5 servings of vegetables per day. A serving of vegetables is:  1 cup of raw leafy vegetables.   cup of raw or cooked cut-up vegetables.   cup of vegetable juice.  Fill one fourth of your plate with whole grains. Look for the word "whole" as the first word in the ingredient list.  Fill one fourth of your plate with lean protein foods.  Eat 4-5 servings of fruit per day. A serving of fruit is:  One medium whole fruit.   cup of dried fruit.   cup of fresh, frozen, or canned fruit.   cup of 100% fruit juice.  Eat more foods that contain soluble fiber. These include apples, broccoli, carrots, beans, peas, and barley. Try to get 20-30 g of fiber per day.  Eat more home-cooked food. Eat less restaurant, buffet, and fast food.  Limit or avoid alcohol.  Limit foods high in starch and sugar.  Avoid fried foods.  Avoid frying your food. Try baking, boiling, grilling, or broiling it instead. You can also reduce fat by:  Removing the skin from poultry.  Removing all visible fats from meats.  Skimming the fat off of stews, soups, and gravies before serving them.  Steaming vegetables in water or broth.  Lose weight if you are overweight.  Eat 4-5 servings of nuts, legumes, and seeds per week:  One serving of dried beans or legumes equals  cup after being cooked.  One serving of nuts equals 1 ounces.  One serving of seeds equals  ounce or one tablespoon.  You may need to keep track of how much salt or sodium you eat.  This is especially true if you have high blood pressure. Talk with your doctor or dietitian to get more information. What foods can I eat? Grains  Breads, including [place], white, pita, wheat, raisin, rye, oatmeal, and [place]. Tortillas that are neither fried nor made with lard or trans fat. Low-fat rolls, including hotdog and hamburger buns and English muffins. Biscuits. Muffins.  Waffles. Pancakes. Light popcorn. Whole-grain cereals. Flatbread. Melba toast. Pretzels. Breadsticks. Rusks. Low-fat snacks. Low-fat crackers, including oyster, saltine, matzo, graham, animal, and rye. Rice and pasta, including brown rice and pastas that are made with whole wheat. Vegetables  All vegetables. Fruits  All fruits, but limit coconut. Meats and Other Protein Sources  Lean, well-trimmed beef, veal, pork, and lamb. Chicken and [place] without skin. All fish and shellfish. Wild duck, rabbit, pheasant, and venison. Egg whites or low-cholesterol egg substitutes. Dried beans, peas, lentils, and tofu. Seeds and most nuts. Dairy  Low-fat or nonfat cheeses, including ricotta, string, and mozzarella. Skim or 1% milk that is liquid, powdered, or evaporated. Buttermilk that is made with low-fat milk. Nonfat or low-fat yogurt. Beverages  Mineral water. Diet carbonated beverages. Sweets and Desserts  Sherbets and fruit ices. Honey, jam, marmalade, jelly, and syrups. Meringues and gelatins. Pure sugar candy, such as hard candy, jelly beans, gumdrops, mints, marshmallows, and small amounts of dark chocolate. [employer] Eat all sweets and desserts in moderation. Fats and Oils  Nonhydrogenated (trans-free) margarines. Vegetable oils, including soybean, sesame, sunflower, olive, peanut, safflower, corn, canola, and cottonseed. Salad dressings or mayonnaise made with a vegetable oil. Limit added fats and oils that you use for cooking, baking, salads, and as spreads. Other  Cocoa powder. Coffee and tea. All seasonings and condiments. The items listed above may not be a complete list of recommended foods or beverages. Contact your dietitian for more options.  What foods are not recommended? Grains  Breads that are made with saturated or trans fats, oils, or whole milk. Croissants. Butter rolls. Cheese breads. Sweet rolls. Donuts. Buttered popcorn. Chow mein noodles. High-fat crackers, such as cheese  or butter crackers. Meats and Other Protein Sources  Fatty meats, such as hotdogs, short ribs, sausage, spareribs, bacon, rib eye roast or steak, and mutton. High-fat deli meats, such as salami and bologna. Caviar. Domestic duck and goose. Organ meats, such as kidney, liver, sweetbreads, and heart. Dairy  Cream, sour cream, cream cheese, and creamed cottage cheese. Whole-milk cheeses, including blue (bleu), Monte[place] Jack, [place], [place], American, [place], Swiss, cheddar, [place], and [place]. Whole or 2% milk that is liquid, evaporated, or condensed. Whole buttermilk. Cream sauce or high-fat cheese sauce. Yogurt that is made from whole milk. Beverages  Regular sodas and juice drinks with added sugar. Sweets and Desserts  Frosting. Pudding. Cookies. Cakes other than angel food cake. Candy that has milk chocolate or white chocolate, hydrogenated fat, butter, coconut, or unknown ingredients. Buttered syrups. Full-fat ice cream or ice cream drinks. Fats and Oils  Gravy that has suet, meat fat, or shortening. Cocoa butter, hydrogenated oils, palm oil, coconut oil, palm kernel oil. These can often be found in baked products, candy, fried foods, nondairy creamers, and whipped toppings. Solid fats and shortenings, including bacon fat, salt pork, lard, and butter. Nondairy cream substitutes, such as coffee creamers and sour cream substitutes. Salad dressings that are made of unknown oils, cheese, or sour cream. The items listed above may not be a complete list of foods and beverages to avoid. Contact your dietitian for more information.  This information is not intended to replace advice given to you by your health care provider. Make sure you discuss any questions you have with your health care provider. Document Released: 02/02/2012 Document Revised: 01/09/2016 Document Reviewed: 01/25/2014  2017 Elsevier    If you need a refill on your cardiac medications before your next appointment, please call your  pharmacy.

## 2016-08-19 NOTE — Assessment & Plan Note (Signed)
Joel Marshall was referred for palpitations. This began shortly after his company was brought out and he's had some issues with cash flow especially prior to Christmas. These palpitations prompted ER visits on 1227 and 1229. His EKG showed no acute changes and his labs were benign. I suspect these are all anxiety driven. He is on some Ativan. We will recommend decrease caffeine intake. I'm also going to get a outpatient sleep study which may be contributory although I suspect most of this is lifestyle mediated. I will see him back in 6 weeks for follow-up.

## 2016-08-20 LAB — LIPID PANEL
CHOL/HDL RATIO: 4.4 ratio (ref <5.0)
CHOLESTEROL: 188 mg/dL (ref <200)
HDL: 43 mg/dL (ref >40)
LDL Cholesterol: 117 mg/dL — ABNORMAL HIGH (ref <100)
Triglycerides: 138 mg/dL (ref <150)
VLDL: 28 mg/dL (ref <30)

## 2016-08-20 LAB — HEPATIC FUNCTION PANEL
ALK PHOS: 67 U/L (ref 40–115)
ALT: 19 U/L (ref 9–46)
AST: 21 U/L (ref 10–40)
Albumin: 4.5 g/dL (ref 3.6–5.1)
BILIRUBIN DIRECT: 0.1 mg/dL (ref <=0.2)
BILIRUBIN TOTAL: 0.5 mg/dL (ref 0.2–1.2)
Indirect Bilirubin: 0.4 mg/dL (ref 0.2–1.2)
Total Protein: 7.6 g/dL (ref 6.1–8.1)

## 2016-08-21 ENCOUNTER — Encounter: Payer: Self-pay | Admitting: Cardiovascular Disease

## 2016-08-27 DIAGNOSIS — N6452 Nipple discharge: Secondary | ICD-10-CM | POA: Diagnosis not present

## 2016-08-27 DIAGNOSIS — N6489 Other specified disorders of breast: Secondary | ICD-10-CM | POA: Diagnosis not present

## 2016-08-28 ENCOUNTER — Other Ambulatory Visit: Payer: Self-pay | Admitting: General Surgery

## 2016-08-31 ENCOUNTER — Other Ambulatory Visit: Payer: Self-pay | Admitting: General Surgery

## 2016-08-31 DIAGNOSIS — N6452 Nipple discharge: Secondary | ICD-10-CM

## 2016-09-07 ENCOUNTER — Ambulatory Visit: Payer: Managed Care, Other (non HMO) | Admitting: Cardiology

## 2016-09-18 DIAGNOSIS — F410 Panic disorder [episodic paroxysmal anxiety] without agoraphobia: Secondary | ICD-10-CM | POA: Diagnosis not present

## 2016-09-18 DIAGNOSIS — I10 Essential (primary) hypertension: Secondary | ICD-10-CM | POA: Diagnosis not present

## 2016-09-18 DIAGNOSIS — F064 Anxiety disorder due to known physiological condition: Secondary | ICD-10-CM | POA: Diagnosis not present

## 2016-09-29 ENCOUNTER — Ambulatory Visit (HOSPITAL_BASED_OUTPATIENT_CLINIC_OR_DEPARTMENT_OTHER): Payer: 59 | Attending: Cardiovascular Disease | Admitting: Cardiovascular Disease

## 2016-09-29 VITALS — Ht 73.0 in | Wt 250.0 lb

## 2016-09-29 DIAGNOSIS — G4733 Obstructive sleep apnea (adult) (pediatric): Secondary | ICD-10-CM | POA: Diagnosis not present

## 2016-09-29 DIAGNOSIS — G4730 Sleep apnea, unspecified: Secondary | ICD-10-CM | POA: Diagnosis present

## 2016-10-06 ENCOUNTER — Ambulatory Visit (INDEPENDENT_AMBULATORY_CARE_PROVIDER_SITE_OTHER): Payer: 59 | Admitting: Cardiovascular Disease

## 2016-10-06 ENCOUNTER — Encounter: Payer: Self-pay | Admitting: Cardiovascular Disease

## 2016-10-06 DIAGNOSIS — I10 Essential (primary) hypertension: Secondary | ICD-10-CM | POA: Diagnosis not present

## 2016-10-06 DIAGNOSIS — G4733 Obstructive sleep apnea (adult) (pediatric): Secondary | ICD-10-CM | POA: Diagnosis not present

## 2016-10-06 DIAGNOSIS — R002 Palpitations: Secondary | ICD-10-CM | POA: Diagnosis not present

## 2016-10-06 MED ORDER — HYDROCHLOROTHIAZIDE 25 MG PO TABS
25.0000 mg | ORAL_TABLET | Freq: Every day | ORAL | 6 refills | Status: DC
Start: 2016-10-06 — End: 2016-10-06

## 2016-10-06 MED ORDER — HYDROCHLOROTHIAZIDE 25 MG PO TABS: 25.0000 mg | ORAL_TABLET | Freq: Every day | ORAL | 6 refills | Status: DC

## 2016-10-06 MED ORDER — NEBIVOLOL HCL 10 MG PO TABS: 10.0000 mg | ORAL_TABLET | Freq: Every day | ORAL | 3 refills | Status: DC

## 2016-10-06 MED FILL — BYSTOLIC 10 MG TABLET: 10 | 30 days supply | Qty: 30 | Fill #0

## 2016-10-06 MED FILL — HYDROCHLOROTHIAZIDE 25 MG T: 25 | 30 days supply | Qty: 30 | Fill #0

## 2016-10-06 NOTE — Patient Instructions (Signed)
Medication Instructions: Increase Bystolic to 10 mg daily.   Follow-Up: Your physician recommends that you schedule a follow-up appointment in: 3 months with Dr. Gwenlyn Found.  If you need a refill on your cardiac medications before your next appointment, please call your pharmacy.

## 2016-10-06 NOTE — Assessment & Plan Note (Signed)
Joel Marshall still complains of palpitations. He keeps a pretty accurate log on his March. He does continue to drink caffeinated beverages from time to time. He has had a sleep study looking for sleep apnea although the results are not available at this time.

## 2016-10-06 NOTE — Assessment & Plan Note (Signed)
Symptoms of obstructive sleep apnea with outpatient sleep study performed last week. We're awaiting the result.

## 2016-10-06 NOTE — Procedures (Signed)
    Patient Name: Joel Marshall, [place] Date: 09/29/2016 Gender: Male D.O.B: 11-11-1980 Age (years): 36 Referring Provider: Lorretta Harp Height (inches): 19 Interpreting Physician: Shelva Majestic MD, ABSM Weight (lbs): 250 RPSGT: Madelon Lips BMI: 33 MRN: [id number] Neck Size: 16.75  CLINICAL INFORMATION Sleep Study Type: NPSG  Indication for sleep study: OSA  Epworth Sleepiness Score: 8  SLEEP STUDY TECHNIQUE As per the AASM Manual for the Scoring of Sleep and Associated Events v2.3 (April 2016) with a hypopnea requiring 4% desaturations.  The channels recorded and monitored were frontal, central and occipital EEG, electrooculogram (EOG), submentalis EMG (chin), nasal and oral airflow, thoracic and abdominal wall motion, anterior tibialis EMG, snore microphone, electrocardiogram, and pulse oximetry.  MEDICATIONS hydrochlorothiazide (HYDRODIURIL) 25 MG tablet LORazepam (ATIVAN) 1 MG tablet nebivolol (BYSTOLIC) 10 MG tablet polyethylene glycol (MIRALAX / GLYCOLAX) packet  Medications self-administered by patient taken the night of the study : N/A  SLEEP ARCHITECTURE The study was initiated at 10:10:16 PM and ended at 4:17:38 AM.  Sleep onset time was 6.7 minutes and the sleep efficiency was 93.4%. The total sleep time was 343.2 minutes.  Wake after sleep onset (WASO) was 17.5 minutes.  Stage REM latency was 193.0 minutes.  The patient spent 7.43% of the night in stage N1 sleep, 77.12% in stage N2 sleep, 0.00% in stage N3 and 15.44% in REM.  Alpha intrusion was absent.  Supine sleep was 40.51%.  RESPIRATORY PARAMETERS The overall apnea/hypopnea index (AHI) was 14.9 per hour. There were 36 total apneas, including 34 obstructive, 2 central and 0 mixed apneas. There were 49 hypopneas and 11 RERAs.  The AHI during Stage REM sleep was 64.5 per hour.  AHI while supine was 25.5 per hour.  The mean oxygen saturation was 93.11%. The minimum SpO2 during sleep was  80.00%.  Loud snoring was noted during this study.  CARDIAC DATA The 2 lead EKG demonstrated sinus rhythm. The mean heart rate was 73.07 beats per minute. Other EKG findings include: None.  LEG MOVEMENT DATA The total PLMS were 1 with a resulting PLMS index of 0.17. Associated arousal with leg movement index was 0.0 .  IMPRESSIONS - Mild/moderate obstructive sleep apnea overall (AHI 14.9/h); however, sleep apnea was moderate with supine position (AHI 25.5/h) and severe during REM sleep (AHI 64.5/h) - No significant central sleep apnea occurred during this study (CAI = 0.3/h). - Moderate oxygen desaturation to a nadir of 80.00%. - The patient snored with Loud snoring volume. - No cardiac abnormalities were noted during this study. - Clinically significant periodic limb movements did not occur during sleep. No significant associated arousals.  DIAGNOSIS - Obstructive Sleep Apnea (327.23 [G47.33 ICD-10]) - Nocturnal Hypoxemia (327.26 [G47.36 ICD-10])  RECOMMENDATIONS - Therapeutic CPAP titration to determine optimal pressure required to alleviate sleep disordered breathing. - Efforts should be made to optimize nasal and oropharyngeal patency - The patient should be counseled to avoid supine sleep;  - Avoid alcohol, sedatives and other CNS depressants that may worsen sleep apnea and disrupt normal sleep architecture. - Sleep hygiene should be reviewed to assess factors that may improve sleep quality. - Weight management and regular exercise should be initiated.  [Electronically signed] 10/06/2016 06:23 PM  Shelva Majestic MD, [facility], ABSM Diplomate, American Board of Sleep Medicine   NPI: [id number] [place] PH: [phone]   FX: [phone] [place]

## 2016-10-06 NOTE — Progress Notes (Signed)
10/06/2016 Joel Marshall   1981-07-08  [id number]  Primary Physician Elyn Peers, MD Primary Cardiologist: Lorretta Harp MD Renae Gloss  HPI:  Joel Marshall is a very pleasant 36 year old mildly overweight married African-American male father of one child who was referred for new onset palpitations and hypertension. I last saw him in the office 08/19/16. He apparently was seen in the emergency room on 1227 and again 2 days later for symptomatic palpitations. Of note, his copy was brought by another company 8 days after Thanksgiving and he's had some issues with transition including financial issues which have caused a significant amount of anxiety and stress. After this, he started to notice palpitations and was seen in the emergency room where EKG showed no acute changes and labs were unremarkable. His blood pressure has been up as well. He admits to drinking 2 cups of coffee a day. There is no family history. He says he is prediabetic. His PCP recently decreased his hydrochlorothiazide from 25 mg daily to 12-1/2 mg daily and began him on Bystolic. He also describes symptoms compatible with obstructive sleep apnea. He continues to have palpitations. He did have an outpatient sleep study last week and we're awaiting that result. He continues to have palpitations which is documented nicely in his spine film. He does computer drink caffeine beverages on occasion.   Current Outpatient Prescriptions  Medication Sig Dispense Refill  . hydrochlorothiazide (HYDRODIURIL) 25 MG tablet Take 1 tablet (25 mg total) by mouth daily. (Patient taking differently: Take 12.5 mg by mouth daily. ) 30 tablet 0  . LORazepam (ATIVAN) 1 MG tablet Take 1 tablet (1 mg total) by mouth 2 (two) times daily. 20 tablet 0  . polyethylene glycol (MIRALAX / GLYCOLAX) packet Take 17 g by mouth daily.     No current facility-administered medications for this visit.     No Known Allergies  Social History   Social  History  . Marital status: Married    Spouse name: N/A  . Number of children: 1  . Years of education: N/A   Occupational History  . IT technician    Social History Main Topics  . Smoking status: Never Smoker  . Smokeless tobacco: Never Used  . Alcohol use No  . Drug use: No  . Sexual activity: No   Other Topics Concern  . Not on file   Social History Narrative  . No narrative on file     Review of Systems: General: negative for chills, fever, night sweats or weight changes.  Cardiovascular: negative for chest pain, dyspnea on exertion, edema, orthopnea, palpitations, paroxysmal nocturnal dyspnea or shortness of breath Dermatological: negative for rash Respiratory: negative for cough or wheezing Urologic: negative for hematuria Abdominal: negative for nausea, vomiting, diarrhea, bright red blood per rectum, melena, or hematemesis Neurologic: negative for visual changes, syncope, or dizziness All other systems reviewed and are otherwise negative except as noted above.    Blood pressure 126/90, pulse 77, height 6\' 1"  (1.854 m), weight 251 lb (113.9 kg).  General appearance: alert and no distress Neck: no adenopathy, no carotid bruit, no JVD, supple, symmetrical, trachea midline and thyroid not enlarged, symmetric, no tenderness/mass/nodules Lungs: clear to auscultation bilaterally Heart: regular rate and rhythm, S1, S2 normal, no murmur, click, rub or gallop Extremities: extremities normal, atraumatic, no cyanosis or edema  EKG not performed today  ASSESSMENT AND PLAN:   Essential hypertension Joel Marshall returns today for follow-up. His blood pressure is 126/90.  He is on hydrochlorothiazide 12.5 Mg a day as well as Bystolic 5 mg a day with the heart rate 77. He is aware of salt avoidance. I am going to increase his Bystolic to 10 mg a day  Palpitations Joel Marshall still complains of palpitations. He keeps a pretty accurate log on his March. He does continue to drink  caffeinated beverages from time to time. He has had a sleep study looking for sleep apnea although the results are not available at this time.  Obstructive sleep apnea Symptoms of obstructive sleep apnea with outpatient sleep study performed last week. We're awaiting the result.      Lorretta Harp MD FACP,FACC,FAHA, [facility] 10/06/2016 8:44 AM

## 2016-10-06 NOTE — Assessment & Plan Note (Signed)
Joel Marshall returns today for follow-up. His blood pressure is 126/90. He is on hydrochlorothiazide 12.5 Mg a day as well as Bystolic 5 mg a day with the heart rate 77. He is aware of salt avoidance. I am going to increase his Bystolic to 10 mg a day

## 2016-10-09 ENCOUNTER — Telehealth: Payer: Self-pay | Admitting: *Deleted

## 2016-10-09 NOTE — Telephone Encounter (Signed)
Left message for patient to return a call to me to discuss sleep study results and recommendations.

## 2016-10-09 NOTE — Telephone Encounter (Signed)
-----   Message from Thomas A Kelly, MD sent at 10/06/2016  6:28 PM EST ----- Wanda, please schedule for CPAP titration study with me to read; thx 

## 2016-10-09 NOTE — Progress Notes (Signed)
10/09/16 left message to t=return a call to discuss.

## 2016-10-14 ENCOUNTER — Telehealth: Payer: Self-pay | Admitting: Cardiovascular Disease

## 2016-10-14 NOTE — Telephone Encounter (Signed)
Returned call to patient-made aware Mariann Laster is OOO today.  Made aware of recommendations per sleep study:  RECOMMENDATIONS - Therapeutic CPAP titration to determine optimal pressure required to alleviate sleep disordered breathing. - Efforts should be made to optimize nasal and oropharyngeal patency - The patient should be counseled to avoid supine sleep;  - Avoid alcohol, sedatives and other CNS depressants that may worsen sleep apnea and disrupt normal sleep architecture. - Sleep hygiene should be reviewed to assess factors that may improve sleep quality. - Weight management and regular exercise should be initiated.  [Electronically signed] 10/06/2016 06:23 PM  Shelva Majestic MD, [facility], [place], American Board of Sleep Medicine   [place] will contact him to get titration study set up.  Pt aware and verbalized understanding.

## 2016-10-14 NOTE — Telephone Encounter (Signed)
New Message    Pt calling to get sleep study results , Krystal Eaton him yesterday

## 2016-10-15 ENCOUNTER — Ambulatory Visit
Admission: RE | Admit: 2016-10-15 | Discharge: 2016-10-15 | Disposition: A | Discharge status: Home / Self Care | Payer: 59 | Source: Ambulatory Visit | Attending: General Surgery | Admitting: General Surgery

## 2016-10-15 DIAGNOSIS — N6452 Nipple discharge: Secondary | ICD-10-CM | POA: Diagnosis not present

## 2016-10-15 DIAGNOSIS — N63 Unspecified lump in unspecified breast: Secondary | ICD-10-CM

## 2016-10-15 HISTORY — DX: Unspecified lump in unspecified breast: N63.0

## 2016-10-27 ENCOUNTER — Telehealth: Payer: Self-pay | Admitting: *Deleted

## 2016-10-27 ENCOUNTER — Other Ambulatory Visit: Payer: Self-pay | Admitting: *Deleted

## 2016-10-27 DIAGNOSIS — G4733 Obstructive sleep apnea (adult) (pediatric): Secondary | ICD-10-CM

## 2016-10-27 NOTE — Telephone Encounter (Signed)
Patient notified of sleep study results and recommendations. 

## 2016-10-27 NOTE — Telephone Encounter (Signed)
-----   Message from Troy Sine, MD sent at 10/06/2016  6:28 PM EST ----- Mariann Laster, please schedule for CPAP titration study with me to read; thx

## 2016-10-27 NOTE — Telephone Encounter (Signed)
Patient noitfied CPAP titration study scheduled for April 11th @ 8:00 pm. Same place he had the first study @ [facility]. Note sent to the pre cert department to pre cert this appointment.

## 2016-10-27 NOTE — Progress Notes (Signed)
10/27/16 patient notified of sleep study results and recommendations.

## 2016-11-06 ENCOUNTER — Telehealth: Payer: Self-pay | Admitting: *Deleted

## 2016-11-06 NOTE — Telephone Encounter (Signed)
PreVisit Call completed. Pt at work but states he turned in new patient packet.

## 2016-11-09 ENCOUNTER — Ambulatory Visit (INDEPENDENT_AMBULATORY_CARE_PROVIDER_SITE_OTHER): Payer: 59 | Admitting: Family Medicine

## 2016-11-09 ENCOUNTER — Encounter: Payer: Self-pay | Admitting: Family Medicine

## 2016-11-09 VITALS — BP 122/82 | HR 70 | Temp 98.1°F | Ht 72.05 in | Wt 248.6 lb

## 2016-11-09 DIAGNOSIS — Z114 Encounter for screening for human immunodeficiency virus [HIV]: Secondary | ICD-10-CM

## 2016-11-09 DIAGNOSIS — Z23 Encounter for immunization: Secondary | ICD-10-CM | POA: Diagnosis not present

## 2016-11-09 DIAGNOSIS — R002 Palpitations: Secondary | ICD-10-CM | POA: Diagnosis not present

## 2016-11-09 DIAGNOSIS — R7989 Other specified abnormal findings of blood chemistry: Secondary | ICD-10-CM

## 2016-11-09 DIAGNOSIS — Z6833 Body mass index (BMI) 33.0-33.9, adult: Secondary | ICD-10-CM | POA: Diagnosis not present

## 2016-11-09 DIAGNOSIS — G4733 Obstructive sleep apnea (adult) (pediatric): Secondary | ICD-10-CM

## 2016-11-09 DIAGNOSIS — R7309 Other abnormal glucose: Secondary | ICD-10-CM | POA: Diagnosis not present

## 2016-11-09 DIAGNOSIS — E6609 Other obesity due to excess calories: Secondary | ICD-10-CM

## 2016-11-09 LAB — HEMOGLOBIN A1C: Hgb A1c MFr Bld: 5.8 % (ref 4.6–6.5)

## 2016-11-09 LAB — BASIC METABOLIC PANEL
BUN: 13 mg/dL (ref 6–23)
CHLORIDE: 99 meq/L (ref 96–112)
CO2: 33 meq/L — AB (ref 19–32)
Calcium: 10 mg/dL (ref 8.4–10.5)
Creatinine, Ser: 1.23 mg/dL (ref 0.40–1.50)
GFR: 85.55 mL/min (ref >60.00)
Glucose, Bld: 87 mg/dL (ref 70–99)
POTASSIUM: 4.1 meq/L (ref 3.5–5.1)
SODIUM: 139 meq/L (ref 135–145)

## 2016-11-09 NOTE — Progress Notes (Signed)
Subjective:    Patient ID: Joel Marshall, male    DOB: Jan 03, 1981, 36 y.o.   MRN: [id number]  HPI This is a 36 yo male who presents today to establish care. He is married and has a 20 yo daughter. 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 Job can be stressful. Plays video games, some walking daily.   Palpitations- Has been under increased stress, his company was bought out. Has financial concerns and would like to buy a house. He was seen in the ER 08/12/16, 08/14/16 with palpitations. EKG/CXR/labs normal, was told he had a panic attack. Has been tracking occurrences. Last palpitations 09/30/16. No chest pain or SOB. Compliant with HCTZ and Bystolic. Had elevated BP readings in Dr. Fransico Setters office. Has follow up scheduled with Dr. Gwenlyn Found.   Obesity- Would like to lose weight. Breakfast- oatmeal/breakfast sandwich on english muffin, brings lunch or eats [employer], dinner- cooks or [employer]. Finds it difficult to meal plan and states that "healthy food is expensive." Has lost 9 pounds since I last saw him 3/17.   OSA/sleep disturbance- he has an appointment scheduled for CPAP titration. Currently his 61 yo daughter sleeps with he and his wife. He reports poor sleep.   Past Medical History:  Diagnosis Date  . Borderline diabetes   . Breast discharge 10/15/2016   white/ forced since 2011  . Breast mass 10/15/2016   behind nipple   . History of intestine removal    Past Surgical History:  Procedure Laterality Date  . SMALL INTESTINE SURGERY     Family History  Problem Relation Age of Onset  . Rheum arthritis Mother   . Hypertension Father   . Breast cancer      cousin  . Colon cancer      cousin  . HIV Brother    Social History  Substance Use Topics  . Smoking status: Never Smoker  . Smokeless tobacco: Never Used  . Alcohol use No      Review of Systems Per HPI    Objective:   Physical Exam Physical Exam  Constitutional: Oriented  to person, place, and time. He appears well-developed and well-nourished.  HENT:  Head: Normocephalic and atraumatic.  Eyes: Conjunctivae are normal.  Neck: Normal range of motion. Neck supple.  Cardiovascular: Normal rate, regular rhythm and normal heart sounds.   Pulmonary/Chest: Effort normal and breath sounds normal.  Musculoskeletal: Normal range of motion.  Neurological: Alert and oriented to person, place, and time.  Skin: Skin is warm and dry.  Psychiatric: Normal mood and affect. Behavior is normal. Judgment and thought content normal.  Vitals reviewed.    BP 122/82 (BP Location: Right Arm, Patient Position: Sitting, Cuff Size: Normal)   Pulse 70   Temp 98.1 F (36.7 C) (Oral)   Ht 6' 0.05" (1.83 m)   Wt 248 lb 9.6 oz (112.8 kg)   SpO2 97%   BMI 33.67 kg/m  Wt Readings from Last 3 Encounters:  11/09/16 248 lb 9.6 oz (112.8 kg)  10/06/16 251 lb (113.9 kg)  09/29/16 250 lb (113.4 kg)   BP Readings from Last 3 Encounters:  11/09/16 122/82  10/06/16 126/90  08/19/16 138/90    Assessment & Plan:  1. Palpitations - no episodes in more than 1 month- likely related to stress with negative cardiology work up - continue to monitor for recurrence, follow up with cardiology as scheduled  2. Elevated serum creatinine - last  creatinine was 1.32, will recheck today - Basic metabolic panel  3. Elevated glucose - Basic metabolic panel - Hemoglobin A1c  4. Class 1 obesity due to excess calories without serious comorbidity with body mass index (BMI) of 33.0 to 33.9 in adult - discussed his current eating patterns and barriers to healthy choices (time, money), discussed ways he can decrease eating out and increase healthy food intake - set goal of 12 pound weight loss for 6 months  5. Obstructive sleep apnea - follow up for CPAP titration as scheduled - encouraged him to transition his daughter to her own bed   6. Screening for HIV (human immunodeficiency virus) - HIV  antibody  7. Need for Tdap vaccination - Tdap vaccine greater than or equal to 7yo IM  - follow up in 6 months  Clarene Reamer, FNP-BC  [facility], [facility]  11/09/2016 9:12 AM

## 2016-11-09 NOTE — Patient Instructions (Addendum)
Look at beginner or gentle or chair yoga Walk most days For breakfast look at overnight oats  [place] and speaker, book [employer]  Follow up in 6 months

## 2016-11-09 NOTE — Progress Notes (Signed)
Pre visit review using our clinic review tool, if applicable. No additional management support is needed unless otherwise documented below in the visit note. 

## 2016-11-10 LAB — HIV ANTIBODY (ROUTINE TESTING W REFLEX): HIV: NONREACTIVE

## 2016-11-25 ENCOUNTER — Ambulatory Visit (HOSPITAL_BASED_OUTPATIENT_CLINIC_OR_DEPARTMENT_OTHER): Payer: 59 | Attending: Cardiovascular Disease | Admitting: Cardiovascular Disease

## 2016-11-25 VITALS — Ht 73.0 in | Wt 251.0 lb

## 2016-11-25 DIAGNOSIS — Z9989 Dependence on other enabling machines and devices: Secondary | ICD-10-CM

## 2016-11-25 DIAGNOSIS — G4733 Obstructive sleep apnea (adult) (pediatric): Secondary | ICD-10-CM | POA: Insufficient documentation

## 2016-11-28 MED FILL — BYSTOLIC 10 MG TABLET: 10 | 30 days supply | Qty: 30 | Fill #1

## 2016-11-28 MED FILL — HYDROCHLOROTHIAZIDE 25 MG T: 25 | 30 days supply | Qty: 30 | Fill #1

## 2016-12-06 NOTE — Procedures (Signed)
Patient Name: Joel Marshall, Joel Marshall Date: 11/25/2016 Gender: Male D.O.B: 1980/11/24 Age (years): 36 Referring Provider: Lorretta Harp Height (inches): 73 Interpreting Physician: Shelva Majestic MD, ABSM Weight (lbs): 250 RPSGT: Jonna Coup BMI: 33 MRN: [id number] Neck Size: 16.75  CLINICAL INFORMATION The patient is referred for a CPAP/ BPAP titration Most recent polysomnogram dated 09/29/2016 revealed an AHI of 14.9/h and RDI of 16.8/h overall; AHI with supine sleep 25.5/h and during REM sleep 64.5/h; oxygen saturation nadir 80%.  MEDICATIONS hydrochlorothiazide (HYDRODIURIL) 25 MG tablet nebivolol (BYSTOLIC) 10 MG tablet polyethylene glycol (MIRALAX / GLYCOLAX) packet  Medications self-administered by patient taken the night of the study : N/A  SLEEP STUDY TECHNIQUE As per the AASM Manual for the Scoring of Sleep and Associated Events v2.3 (April 2016) with a hypopnea requiring 4% desaturations.  The channels recorded and monitored were frontal, central and occipital EEG, electrooculogram (EOG), submentalis EMG (chin), nasal and oral airflow, thoracic and abdominal wall motion, anterior tibialis EMG, snore microphone, electrocardiogram, and pulse oximetry. Bi-level positive airway pressure (BiPAP) was initiated when the patient met split night criteria and was titrated according to treat sleep-disordered breathing.  RESPIRATORY PARAMETERS Titration  Optimal IPAP Pressure (cm): 18 Optimal EPAP Pressure (cm): 14 AHI at Optimal Pressure (/hr): 0.0 Min O2 at Optimal Pressure (%): 97.0 Sleep % at Optimal (%): 100 Supine % at Optimal (%): 100          SLEEP ARCHITECTURE The study was initiated at 10:28:49 PM and terminated at 4:37:34 AM. The total recorded time was 368.8 minutes. EEG confirmed total sleep time was 333.3 minutes yielding a sleep efficiency of 90.4%. Sleep onset after lights out was 7.4 minutes with a REM latency of 69.5 minutes. The patient spent 1.50% of the  night in stage N1 sleep, 75.40% in stage N2 sleep, 0.00% in stage N3 and 23.10% in REM. Wake after sleep onset (WASO) was 28.0 minutes. The Arousal Index was 4.5/hour.  LEG MOVEMENT DATA The total Periodic Limb Movements of Sleep (PLMS) were 3. The PLMS index was 0.54 .  CARDIAC DATA The 2 lead EKG demonstrated sinus rhythm. The mean heart rate was 63.12 beats per minute. Other EKG findings include: None.  IMPRESSIONS - CPAP was initiated as 5 cm and was titrated up to 14 cm; due to continued events and snoring BiPAP was initiated at 15/11 and was titrated up to 18/14  AHI at 18/14 was 0. - No significant central sleep apnea occurred during the diagnostic portion of the study (CAI = N/A/hour). - Oxygen nadir was 90% at 13 cm wp; 02 nadir at optimal pressure was 97%. - The patient snored with Moderate snoring volume. - No cardiac abnormalities were noted during this study.  DIAGNOSIS - Obstructive Sleep Apnea (327.23 [G47.33 ICD-10])  RECOMMENDATIONS - Recommend an initial trial of BiPAP therapy with Bi-Flex/EPR at 18/14 cm H2O with heated humidification.  A Medium size Fisher[employer]Paykel Full Face Mask Simplus mask was used for the titration. - Efforts should be made to optimize nasal and oropharyngeal patency.  - Avoid alcohol, sedatives and other CNS depressants that may worsen sleep apnea and disrupt normal sleep architecture. - Sleep hygiene should be reviewed to assess factors that may improve sleep quality. - Weight management and regular exercise should be initiated or continued. - Recommend a download be obtained in 30 days and sleep clinic evaluation after 4 weeks of therapy.  [Electronically signed] 12/06/2016 03:42 PM  Shelva Majestic MD, [facility], [place], American Board of  Sleep Medicine   NPI: [id number] [place] PH: [phone]   FX: [phone] [place]

## 2016-12-18 ENCOUNTER — Telehealth: Payer: Self-pay | Admitting: *Deleted

## 2016-12-18 NOTE — Telephone Encounter (Signed)
Referral for BIPAP set up sent to [facility]. Left message notifying patient on his home voice mail with their contact information.

## 2016-12-18 NOTE — Progress Notes (Signed)
12/18/16 referral sent to [facility] for BIPAP set up.

## 2016-12-29 ENCOUNTER — Ambulatory Visit (INDEPENDENT_AMBULATORY_CARE_PROVIDER_SITE_OTHER): Payer: 59 | Admitting: Cardiovascular Disease

## 2016-12-29 ENCOUNTER — Encounter: Payer: Self-pay | Admitting: Cardiovascular Disease

## 2016-12-29 DIAGNOSIS — G4733 Obstructive sleep apnea (adult) (pediatric): Secondary | ICD-10-CM

## 2016-12-29 DIAGNOSIS — R002 Palpitations: Secondary | ICD-10-CM

## 2016-12-29 DIAGNOSIS — I10 Essential (primary) hypertension: Secondary | ICD-10-CM | POA: Diagnosis not present

## 2016-12-29 NOTE — Progress Notes (Signed)
12/29/2016 Joel Marshall   1980/10/18  [id number]  Primary Physician Elby Beck, FNP Primary Cardiologist: Lorretta Harp MD Renae Gloss  HPI:  Joel Marshall is a very pleasant 36 year old moderately overweight married African-American male father of one child who was referred for new onset palpitations and hypertension. I last saw him in the office 10/06/16. He apparently was seen in the emergency room on 08/12/16  and again 2 days later for symptomatic palpitations. Of note, his company was brought by another company 8 days after Thanksgiving and he's had some issues with transition including financial issues which have caused a significant amount of anxiety and stress. After this, he started to notice palpitations and was seen in the emergency room where EKG showed no acute changes and labs were unremarkable. His blood pressure has been up as well. He admits to drinking a pot of coffee a day which he has subsequently reduced to one cup per day.. There is no family history. He says he is prediabetic. His PCP recently decreased his hydrochlorothiazide from 25 mg daily to 12-1/2 mg daily and began him on Bystolic. He also describes symptoms compatible with obstructive sleep apnea. He continues to have palpitations. He did have an outpatient sleep study last week and we're awaiting that result. He continues to have palpitations which fairly frequently which he attributes to stress. He did have a sleep study which was positive and was prescribed CPAP by Dr. Claiborne Billings.   Current Outpatient Prescriptions  Medication Sig Dispense Refill  . hydrochlorothiazide (HYDRODIURIL) 12.5 MG tablet Take 12.5 mg by mouth daily.    . nebivolol (BYSTOLIC) 10 MG tablet Take 1 tablet (10 mg total) by mouth daily. 30 tablet 3  . polyethylene glycol (MIRALAX / GLYCOLAX) packet Take 17 g by mouth daily.     No current facility-administered medications for this visit.     No Known Allergies  Social History    Social History  . Marital status: Married    Spouse name: N/A  . Number of children: 1  . Years of education: N/A   Occupational History  . IT technician    Social History Main Topics  . Smoking status: Never Smoker  . Smokeless tobacco: Never Used  . Alcohol use No  . Drug use: No  . Sexual activity: No   Other Topics Concern  . Not on file   Social History Narrative  . No narrative on file     Review of Systems: General: negative for chills, fever, night sweats or weight changes.  Cardiovascular: negative for chest pain, dyspnea on exertion, edema, orthopnea, palpitations, paroxysmal nocturnal dyspnea or shortness of breath Dermatological: negative for rash Respiratory: negative for cough or wheezing Urologic: negative for hematuria Abdominal: negative for nausea, vomiting, diarrhea, bright red blood per rectum, melena, or hematemesis Neurologic: negative for visual changes, syncope, or dizziness All other systems reviewed and are otherwise negative except as noted above.    Blood pressure 116/78, pulse 67, height 6\' 1"  (1.854 m), weight 248 lb (112.5 kg).  General appearance: alert and no distress Neck: no adenopathy, no carotid bruit, no JVD, supple, symmetrical, trachea midline and thyroid not enlarged, symmetric, no tenderness/mass/nodules Lungs: clear to auscultation bilaterally Heart: regular rate and rhythm, S1, S2 normal, no murmur, click, rub or gallop Extremities: extremities normal, atraumatic, no cyanosis or edema  EKG normal sinus rhythm at 67 without ST or T-wave changes. I personally reviewed this EKG  ASSESSMENT AND PLAN:  Essential hypertension History of hypertension blood pressure measured 116/78. He is on hydrochlorothiazide and Bystolic. Continue current meds at current dosing  Palpitations History of palpitations probably multifactorial related to stress, stimulants such as caffeine obstructive sleep apnea. He has reduced his caffeine  intake from one pot of coffee daily one cup a day. Talked about stress reduction. At this point, because of finances will delay performing 2-D echocardiography and event monitoring. We'll see him back in 6 months.  Obstructive sleep apnea History of obstructive sleep apnea with prescription for CPAP provided by Dr. Claiborne Billings during his most recent office visit in his sleep clinic 11/25/16.      Lorretta Harp MD FACP,FACC,FAHA, [facility] 12/29/2016 8:46 AM

## 2016-12-29 NOTE — Assessment & Plan Note (Signed)
History of obstructive sleep apnea with prescription for CPAP provided by Dr. Claiborne Billings during his most recent office visit in his sleep clinic 11/25/16.

## 2016-12-29 NOTE — Assessment & Plan Note (Signed)
History of hypertension blood pressure measured 116/78. He is on hydrochlorothiazide and Bystolic. Continue current meds at current dosing

## 2016-12-29 NOTE — Assessment & Plan Note (Signed)
History of palpitations probably multifactorial related to stress, stimulants such as caffeine obstructive sleep apnea. He has reduced his caffeine intake from one pot of coffee daily one cup a day. Talked about stress reduction. At this point, because of finances will delay performing 2-D echocardiography and event monitoring. We'll see him back in 6 months.

## 2016-12-29 NOTE — Patient Instructions (Signed)

## 2016-12-31 MED FILL — BYSTOLIC 10 MG TABLET: 10 | 30 days supply | Qty: 30 | Fill #2

## 2017-01-31 MED FILL — BYSTOLIC 10 MG TABLET: 10 | 30 days supply | Qty: 30 | Fill #3

## 2017-01-31 MED FILL — HYDROCHLOROTHIAZIDE 25 MG T: 25 | 30 days supply | Qty: 30 | Fill #2

## 2017-02-05 DIAGNOSIS — G4733 Obstructive sleep apnea (adult) (pediatric): Secondary | ICD-10-CM | POA: Diagnosis not present

## 2017-03-03 ENCOUNTER — Other Ambulatory Visit: Payer: Self-pay | Admitting: Cardiovascular Disease

## 2017-03-04 MED FILL — BYSTOLIC 10 MG TABLET: 10 | 30 days supply | Qty: 30 | Fill #0 | Status: TO

## 2017-03-04 NOTE — Telephone Encounter (Signed)
REFILL 

## 2017-03-05 ENCOUNTER — Other Ambulatory Visit: Payer: Self-pay

## 2017-03-05 ENCOUNTER — Telehealth: Payer: Self-pay | Admitting: Cardiovascular Disease

## 2017-03-05 MED ORDER — NEBIVOLOL HCL 10 MG PO TABS: 10.0000 mg | ORAL_TABLET | Freq: Every day | ORAL | 6 refills | Status: DC

## 2017-03-05 NOTE — Telephone Encounter (Signed)
New message     *STAT* If patient is at the pharmacy, call can be transferred to refill team.   1. Which medications need to be refilled? (please list name of each medication and dose if known) bystolic 10 mg  2. Which pharmacy/location (including street and city if local pharmacy) is medication to be sent to? [facility]   3. Do they need a 30 day or 90 day supply? [place]

## 2017-03-07 DIAGNOSIS — G4733 Obstructive sleep apnea (adult) (pediatric): Secondary | ICD-10-CM | POA: Diagnosis not present

## 2017-03-31 ENCOUNTER — Ambulatory Visit (INDEPENDENT_AMBULATORY_CARE_PROVIDER_SITE_OTHER): Payer: 59 | Admitting: Family Medicine

## 2017-03-31 ENCOUNTER — Telehealth: Payer: Self-pay | Admitting: Family Medicine

## 2017-03-31 ENCOUNTER — Encounter: Payer: Self-pay | Admitting: Family Medicine

## 2017-03-31 VITALS — BP 118/70 | HR 77 | Temp 98.1°F | Ht 73.0 in | Wt 255.5 lb

## 2017-03-31 DIAGNOSIS — E6609 Other obesity due to excess calories: Secondary | ICD-10-CM | POA: Diagnosis not present

## 2017-03-31 DIAGNOSIS — Z6833 Body mass index (BMI) 33.0-33.9, adult: Secondary | ICD-10-CM

## 2017-03-31 DIAGNOSIS — I10 Essential (primary) hypertension: Secondary | ICD-10-CM

## 2017-03-31 DIAGNOSIS — R002 Palpitations: Secondary | ICD-10-CM

## 2017-03-31 NOTE — Telephone Encounter (Signed)
Patient called in stating he was having "heart palpitations". Sent patient to [facility].

## 2017-03-31 NOTE — Telephone Encounter (Signed)
[facility] called with patient advising that a sooner appointment is needed other than his 04/30/2017 appointment. She states his heart rate went to 95 for about 35 minutes. I scheduled the patient for 3 pm today. Please see [facility] health disposition.

## 2017-03-31 NOTE — Progress Notes (Signed)
Subjective:    Patient ID: Joel Marshall, male    DOB: [birth year], 36 y.o.   MRN: [id number]  HPI This is a 36 yo male who presents today with palpitations. Has been under more stress lately, work insecure. He and his wife are trying to find a safer place to live. He is currently looking for a different job. He is followed by Dr. Gwenlyn Found (cardiology) for palpitations and HTN. Was last seen 12/29/16 and has follow up scheduled.  Had an episode of palpitations at 1 o'clock today, lasted about 30 minutes. Had two other episodes that lasted for a couple of minutes. Having 1-2 episodes a month. Usually resolves with deep breathing, was not effective today and finally started to go away when on phone with triage today.  He denies chest pain, SOB, dizziness. Had intermittent throbbing of left temple area 8/6-8, resolved, but came back last night and had today. Had a couple episodes of blurred vision that lasted a couple of minutes. He is currently not having any headache or palpitations. He drinks 1-2 coffees a day. Will occasionally have coffee from [employer].   Has been using Cpap since June 22. Some improvement in sleep. Daughter (21 yo) still awakens him nightly.    Past Medical History:  Diagnosis Date  . Borderline diabetes   . Breast discharge 10/15/2016   white/ forced since 2011  . Breast mass 10/15/2016   behind nipple   . History of intestine removal    Past Surgical History:  Procedure Laterality Date  . SMALL INTESTINE SURGERY     Family History  Problem Relation Age of Onset  . Rheum arthritis Mother   . Hypertension Father   . Breast cancer Unknown        cousin  . Colon cancer Unknown        cousin  . HIV Brother    Social History  Substance Use Topics  . Smoking status: Never Smoker  . Smokeless tobacco: Never Used  . Alcohol use No      Review of Systems Per HPI    Objective:   Physical Exam Physical Exam  Constitutional: Oriented to person, place, and time. He  appears well-developed and well-nourished.  HENT:  Head: Normocephalic and atraumatic.  Eyes: Conjunctivae are normal.  Neck: Normal range of motion. Neck supple.  Cardiovascular: Normal rate, regular rhythm and normal heart sounds.   Pulmonary/Chest: Effort normal and breath sounds normal.  Musculoskeletal: Normal range of motion. No pedal edema.  Neurological: Alert and oriented to person, place, and time.  Skin: Skin is warm and dry.  Psychiatric: Normal mood and affect. Behavior is normal. Judgment and thought content normal.  Vitals reviewed.     BP 118/70 (BP Location: Left Arm, Patient Position: Sitting, Cuff Size: Normal)   Pulse 77   Temp 98.1 F (36.7 C) (Oral)   Ht 6\' 1"  (1.854 m)   Wt 255 lb 8 oz (115.9 kg)   SpO2 97%   BMI 33.71 kg/m  Wt Readings from Last 3 Encounters:  03/31/17 255 lb 8 oz (115.9 kg)  12/29/16 248 lb (112.5 kg)  11/25/16 251 lb (113.9 kg)   BP Readings from Last 3 Encounters:  03/31/17 118/70  12/29/16 116/78  11/09/16 122/82   EKG- personally reviewed by me. NSR, rate 68    Assessment & Plan:  1. Heart palpitations - likely related to stress and caffeine intake. Continue follow up as scheduled with cardiology.  - reviewed RTC/ED precautions -  EKG 12-Lead - discussed stress and ways to improve stress- exercise, good sleep hygiene  2. Class 1 obesity due to excess calories without serious comorbidity with body mass index (BMI) of 33.0 to 33.9 in adult - encouraged him to avoid sugary drinks and fast food, encouraged regular exercise (walking)  3. Essential hypertension - well controlled, continue current meds  - follow up in 1 month, will check labs at that time  Clarene Reamer, FNP-BC  [facility] [place], [facility]  04/02/2017 8:47 AM

## 2017-03-31 NOTE — Patient Instructions (Signed)
Try to increase exercise- a couple of times a week  Schedule next month for follow up of labs

## 2017-03-31 NOTE — Telephone Encounter (Signed)
Patient is currently in the office being seen by Tor Netters, FNP.

## 2017-03-31 NOTE — Telephone Encounter (Signed)
Patient Name: Joel Marshall  DOB: October 20, 1980    Initial Comment Caller states he is having heart palpatations, started at 1pm, and hasn't stopped yet.    Nurse Assessment  Nurse: Joline Salt, RN, Malachy Mood Date/Time Eilene Ghazi Time): 03/31/2017 1:38:01 PM  Confirm and document reason for call. If symptomatic, describe symptoms. ---Caller states he is having heart palpatations, started at 111pm, and hasn't stopped yet. Beats are between 85-95 per minutes. Not dizziness. Has happened before but not this long a duration. Quay Burow, cardiologist.  Does the patient have any new or worsening symptoms? ---Yes  Will a triage be completed? ---Yes  Related visit to physician within the last 2 weeks? ---No  Does the PT have any chronic conditions? (i.e. diabetes, asthma, etc.) ---No  Is this a behavioral health or substance abuse call? ---No     Guidelines    Guideline Title Affirmed Question Affirmed Notes  Heart Rate and Heartbeat Questions Problems with anxiety or stress    Final Disposition User   See PCP within [place], RN, Malachy Mood    Comments  Had 3 fillings done yesterday at dentist  States he felt like it was shaking him and lasted so long. Rate at 146pm is now 80.  Warm transferred for appointment with Rachel Bo at [facility]   Disagree/Comply: Comply

## 2017-04-01 MED FILL — HYDROCHLOROTHIAZIDE 25 MG T: 25 | 30 days supply | Qty: 30 | Fill #3 | Status: TO

## 2017-04-07 ENCOUNTER — Telehealth: Payer: Self-pay | Admitting: Cardiovascular Disease

## 2017-04-07 DIAGNOSIS — G4733 Obstructive sleep apnea (adult) (pediatric): Secondary | ICD-10-CM | POA: Diagnosis not present

## 2017-04-07 NOTE — Telephone Encounter (Signed)
Closed Encounter  °

## 2017-05-05 ENCOUNTER — Encounter: Payer: Self-pay | Admitting: Family Medicine

## 2017-05-05 ENCOUNTER — Ambulatory Visit: Payer: 59 | Admitting: Family Medicine

## 2017-05-05 ENCOUNTER — Ambulatory Visit (INDEPENDENT_AMBULATORY_CARE_PROVIDER_SITE_OTHER): Payer: 59 | Admitting: Family Medicine

## 2017-05-05 VITALS — BP 128/88 | HR 66 | Temp 98.0°F | Wt 256.8 lb

## 2017-05-05 DIAGNOSIS — R7303 Prediabetes: Secondary | ICD-10-CM | POA: Diagnosis not present

## 2017-05-05 DIAGNOSIS — Z23 Encounter for immunization: Secondary | ICD-10-CM | POA: Diagnosis not present

## 2017-05-05 LAB — POCT GLYCOSYLATED HEMOGLOBIN (HGB A1C): Hemoglobin A1C: 5.9
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
## 2017-05-05 NOTE — Patient Instructions (Addendum)
Please follow up your prediabetes in 6 months  It is important to work on healthy food choices and getting more exercise   Heart-Healthy Eating Plan Heart-healthy meal planning includes:  Limiting unhealthy fats.  Increasing healthy fats.  Making other small dietary changes.  You may need to talk with your doctor or a diet specialist (dietitian) to create an eating plan that is right for you. What types of fat should I choose?  Choose healthy fats. These include olive oil and canola oil, flaxseeds, walnuts, almonds, and seeds.  Eat more omega-3 fats. These include salmon, mackerel, sardines, tuna, flaxseed oil, and ground flaxseeds. Try to eat fish at least twice each week.  Limit saturated fats. ? Saturated fats are often found in animal products, such as meats, butter, and cream. ? Plant sources of saturated fats include palm oil, palm kernel oil, and coconut oil.  Avoid foods with partially hydrogenated oils in them. These include stick margarine, some tub margarines, cookies, crackers, and other baked goods. These contain trans fats. What general guidelines do I need to follow?  Check food labels carefully. Identify foods with trans fats or high amounts of saturated fat.  Fill one half of your plate with vegetables and green salads. Eat 4-5 servings of vegetables per day. A serving of vegetables is: ? 1 cup of raw leafy vegetables. ?  cup of raw or cooked cut-up vegetables. ?  cup of vegetable juice.  Fill one fourth of your plate with whole grains. Look for the word "whole" as the first word in the ingredient list.  Fill one fourth of your plate with lean protein foods.  Eat 4-5 servings of fruit per day. A serving of fruit is: ? One medium whole fruit. ?  cup of dried fruit. ?  cup of fresh, frozen, or canned fruit. ?  cup of 100% fruit juice.  Eat more foods that contain soluble fiber. These include apples, broccoli, carrots, beans, peas, and barley. Try to  get 20-30 g of fiber per day.  Eat more home-cooked food. Eat less restaurant, buffet, and fast food.  Limit or avoid alcohol.  Limit foods high in starch and sugar.  Avoid fried foods.  Avoid frying your food. Try baking, boiling, grilling, or broiling it instead. You can also reduce fat by: ? Removing the skin from poultry. ? Removing all visible fats from meats. ? Skimming the fat off of stews, soups, and gravies before serving them. ? Steaming vegetables in water or broth.  Lose weight if you are overweight.  Eat 4-5 servings of nuts, legumes, and seeds per week: ? One serving of dried beans or legumes equals  cup after being cooked. ? One serving of nuts equals 1 ounces. ? One serving of seeds equals  ounce or one tablespoon.  You may need to keep track of how much salt or sodium you eat. This is especially true if you have high blood pressure. Talk with your doctor or dietitian to get more information. What foods can I eat? Grains Breads, including [place], white, pita, wheat, raisin, rye, oatmeal, and [place]. Tortillas that are neither fried nor made with lard or trans fat. Low-fat rolls, including hotdog and hamburger buns and English muffins. Biscuits. Muffins. Waffles. Pancakes. Light popcorn. Whole-grain cereals. Flatbread. Melba toast. Pretzels. Breadsticks. Rusks. Low-fat snacks. Low-fat crackers, including oyster, saltine, matzo, graham, animal, and rye. Rice and pasta, including brown rice and pastas that are made with whole wheat. Vegetables All vegetables. Fruits All fruits,  but limit coconut. Meats and Other Protein Sources Lean, well-trimmed beef, veal, pork, and lamb. Chicken and [place] without skin. All fish and shellfish. Wild duck, rabbit, pheasant, and venison. Egg whites or low-cholesterol egg substitutes. Dried beans, peas, lentils, and tofu. Seeds and most nuts. Dairy Low-fat or nonfat cheeses, including ricotta, string, and mozzarella. Skim or 1% milk  that is liquid, powdered, or evaporated. Buttermilk that is made with low-fat milk. Nonfat or low-fat yogurt. Beverages Mineral water. Diet carbonated beverages. Sweets and Desserts Sherbets and fruit ices. Honey, jam, marmalade, jelly, and syrups. Meringues and gelatins. Pure sugar candy, such as hard candy, jelly beans, gumdrops, mints, marshmallows, and small amounts of dark chocolate. [employer] Eat all sweets and desserts in moderation. Fats and Oils Nonhydrogenated (trans-free) margarines. Vegetable oils, including soybean, sesame, sunflower, olive, peanut, safflower, corn, canola, and cottonseed. Salad dressings or mayonnaise made with a vegetable oil. Limit added fats and oils that you use for cooking, baking, salads, and as spreads. Other Cocoa powder. Coffee and tea. All seasonings and condiments. The items listed above may not be a complete list of recommended foods or beverages. Contact your dietitian for more options. What foods are not recommended? Grains Breads that are made with saturated or trans fats, oils, or whole milk. Croissants. Butter rolls. Cheese breads. Sweet rolls. Donuts. Buttered popcorn. Chow mein noodles. High-fat crackers, such as cheese or butter crackers. Meats and Other Protein Sources Fatty meats, such as hotdogs, short ribs, sausage, spareribs, bacon, rib eye roast or steak, and mutton. High-fat deli meats, such as salami and bologna. Caviar. Domestic duck and goose. Organ meats, such as kidney, liver, sweetbreads, and heart. Dairy Cream, sour cream, cream cheese, and creamed cottage cheese. Whole-milk cheeses, including blue (bleu), Monterey Jack, [place], [place], American, [place], Swiss, cheddar, [place], and [place]. Whole or 2% milk that is liquid, evaporated, or condensed. Whole buttermilk. Cream sauce or high-fat cheese sauce. Yogurt that is made from whole milk. Beverages Regular sodas and juice drinks with added sugar. Sweets and  Desserts Frosting. Pudding. Cookies. Cakes other than angel food cake. Candy that has milk chocolate or white chocolate, hydrogenated fat, butter, coconut, or unknown ingredients. Buttered syrups. Full-fat ice cream or ice cream drinks. Fats and Oils Gravy that has suet, meat fat, or shortening. Cocoa butter, hydrogenated oils, palm oil, coconut oil, palm kernel oil. These can often be found in baked products, candy, fried foods, nondairy creamers, and whipped toppings. Solid fats and shortenings, including bacon fat, salt pork, lard, and butter. Nondairy cream substitutes, such as coffee creamers and sour cream substitutes. Salad dressings that are made of unknown oils, cheese, or sour cream. The items listed above may not be a complete list of foods and beverages to avoid. Contact your dietitian for more information. This information is not intended to replace advice given to you by your health care provider. Make sure you discuss any questions you have with your health care provider. Document Released: 02/02/2012 Document Revised: 01/09/2016 Document Reviewed: 01/25/2014 Elsevier Interactive Patient Education  [employer].

## 2017-05-08 DIAGNOSIS — G4733 Obstructive sleep apnea (adult) (pediatric): Secondary | ICD-10-CM | POA: Diagnosis not present

## 2017-05-12 ENCOUNTER — Ambulatory Visit: Payer: 59 | Admitting: Family Medicine

## 2017-05-13 ENCOUNTER — Encounter: Payer: Self-pay | Admitting: Cardiovascular Disease

## 2017-05-13 ENCOUNTER — Ambulatory Visit (INDEPENDENT_AMBULATORY_CARE_PROVIDER_SITE_OTHER): Payer: 59 | Admitting: Cardiovascular Disease

## 2017-05-13 VITALS — BP 132/81 | HR 68 | Ht 73.0 in | Wt 256.8 lb

## 2017-05-13 DIAGNOSIS — R002 Palpitations: Secondary | ICD-10-CM | POA: Diagnosis not present

## 2017-05-13 DIAGNOSIS — E669 Obesity, unspecified: Secondary | ICD-10-CM

## 2017-05-13 DIAGNOSIS — G4733 Obstructive sleep apnea (adult) (pediatric): Secondary | ICD-10-CM

## 2017-05-13 NOTE — Patient Instructions (Signed)
Your physician recommends that you schedule a follow-up appointment in: AS NEEDED with Dr. Claiborne Billings in sleep clinic.

## 2017-05-13 NOTE — Progress Notes (Signed)
Cardiology Office Note    Date:  05/15/2017   ID:  Joel Marshall, DOB Nov 02, 1980, MRN [id number]  PCP:  Joel Beck, FNP  Cardiologist:  Joel Majestic, MD (sleep); Dr. Rolley Marshall  New sleep evaluation following initiation of CPAP therapy  History of Present Illness:  Joel Marshall is a 36 y.o. male who  presents to sleep clinic today following initiation of CPAP therapy for sleep apnea.  Mr. Null has a history of obesity, and palpitations.  Concerns for sleep apnea.  He was referred for a sleep study which demonstrated moderate sleep apnea with an overall AHI at 14.9; however sleep apnea was more significant with supine sleep with an AHI of 25.5 and severe during Rehm sleep with an HI of 64.5.  He had significant oxygen desaturation to a nadir of 80%.  There was evidence for loud snoring.  He was referred for a CPAP titration.  Due to continued events with CPAP.  He ultimately was transitioned to BiPAP and was titrated up to 18/14.  His BiPAP set up date was 02/05/2017 and he has an air curve 10 V auto BiPAP unit with current settings and an EPAP of 14 and IPAP of 18.  He has a fissure impact calcium plaques fullface mask, medium size.  A download was obtained in the office today from August 26 through 05/10/2017.  He is 100% compliant.  He is averaging 6 hours and 45 minutes of sleep per night.  Typically, he did states he goes to bed between 10 and 11:30 and wakes up around 6:30 AM.  On his current settings, AHI is still elevated at 7.8, with an apnea index of 6.2, and a hypopnea index of 1.6.  He does not have significant leak except for 1 day.  He does feel that his sleep has improved with initiation of therapy.  He admits to significant stress with his job.  He is unaware of breakthrough snoring.  He denies residual daytime sleepiness, bruxism, sleep paralysis, or cataplexy.  His Epworth Sleepiness Scale score endorsed at 6, arguing against significant daytime sleepiness.   Epworth Sleepiness  Scale: Situation   Chance of Dozing/Sleeping (0 = never , 1 = slight chance , 2 = moderate chance , 3 = high chance )   sitting and reading 1   watching TV 1   sitting inactive in a public place 1   being a passenger in a motor vehicle for an hour or more 0   lying down in the afternoon 1   sitting and talking to someone 1   sitting quietly after lunch (no alcohol) 1   while stopped for a few minutes in traffic as the driver 0   Total Score  6   He presents for evaluation.  Past Medical History:  Diagnosis Date  . Borderline diabetes   . Breast discharge 10/15/2016   white/ forced since 2011  . Breast mass 10/15/2016   behind nipple   . History of intestine removal     Past Surgical History:  Procedure Laterality Date  . SMALL INTESTINE SURGERY      Current Medications: Outpatient Medications Prior to Visit  Medication Sig Dispense Refill  . hydrochlorothiazide (HYDRODIURIL) 25 MG tablet   6  . nebivolol (BYSTOLIC) 10 MG tablet Take 1 tablet (10 mg total) by mouth daily. 30 tablet 6  . polyethylene glycol (MIRALAX / GLYCOLAX) packet Take 17 g by mouth daily.     No facility-administered medications prior to  visit.      Allergies:   Patient has no known allergies.   Social History   Social History  . Marital status: Married    Spouse name: N/A  . Number of children: 1  . Years of education: N/A   Occupational History  . IT technician    Social History Main Topics  . Smoking status: Never Smoker  . Smokeless tobacco: Never Used  . Alcohol use No  . Drug use: No  . Sexual activity: No   Other Topics Concern  . None   Social History Narrative  . None    Additional social history is notable in that he attended [employer].  He works as an Recruitment consultant.  He has a daughter age 29.  Family History:  The patient's family history includes Breast cancer in his unknown relative; Colon cancer in his unknown relative; HIV in his brother;  Hypertension in his father; Rheum arthritis in his mother.   ROS General: Negative; No fevers, chills, or night sweats;  HEENT: Negative; No changes in vision or hearing, sinus congestion, difficulty swallowing Pulmonary: Negative; No cough, wheezing, shortness of breath, hemoptysis Cardiovascular: See history of present illness GI: Negative; No nausea, vomiting, diarrhea, or abdominal pain GU: Negative; No dysuria, hematuria, or difficulty voiding Musculoskeletal: Negative; no myalgias, joint pain, or weakness Hematologic/Oncology: Negative; no easy bruising, bleeding Endocrine: Negative; no heat/cold intolerance; no diabetes Neuro: Negative; no changes in balance, headaches Skin: Negative; No rashes or skin lesions Psychiatric: Negative; No behavioral problems, depression Sleep: See history of present illness  Other comprehensive 14 point system review is negative.   PHYSICAL EXAM:   VS:  BP 132/81   Pulse 68   Ht '6\' 1"'$  (1.854 m)   Wt 256 lb 12.8 oz (116.5 kg)   BMI 33.88 kg/m     Wt Readings from Last 3 Encounters:  05/13/17 256 lb 12.8 oz (116.5 kg)  05/05/17 256 lb 12.8 oz (116.5 kg)  03/31/17 255 lb 8 oz (115.9 kg)    General: Alert, oriented, no distress.  Skin: normal turgor, no rashes, warm and dry HEENT: Normocephalic, atraumatic. Pupils equal round and reactive to light; sclera anicteric; extraocular muscles intact; Fundi Without hemorrhages or exudates. Nose without nasal septal hypertrophy Mouth/Parynx benign; Mallinpatti scale 3 Neck: Thick neck, with neck size 18-1/2-19; No JVD, no carotid bruits; normal carotid upstroke Lungs: clear to ausculatation and percussion; no wheezing or rales Chest wall: without tenderness to palpitation Heart: PMI not displaced, RRR, s1 s2 normal, 1/6 systolic murmur, no diastolic murmur, no rubs, gallops, thrills, or heaves Abdomen: soft, nontender; no hepatosplenomehaly, BS+; abdominal aorta nontender and not dilated by  palpation. Back: no CVA tenderness Pulses 2+ Musculoskeletal: full range of motion, normal strength, no joint deformities Extremities: no clubbing cyanosis or edema, Homan's sign negative  Neurologic: grossly nonfocal; Cranial nerves grossly wnl Psychologic: Normal mood and affect   Studies/Labs Reviewed:   EKG:  EKG is not ordered today.  I personally reviewed his last ECG from 03/31/2017 which showed sinus rhythm at 68 bpm with no ectopy.  Normal intervals.  Recent Labs: BMP Latest Ref Rng & Units 11/09/2016 08/14/2016 08/12/2016  Glucose 70 - 99 mg/dL 87 87 143(H)  BUN 6 - 23 mg/dL '13 13 13  '$ Creatinine 0.40 - 1.50 mg/dL 1.23 1.32(H) 1.12  Sodium 135 - 145 mEq/L 139 137 140  Potassium 3.5 - 5.1 mEq/L 4.1 3.6 3.5  Chloride 96 - 112 mEq/L 99 97(L)  102  CO2 19 - 32 mEq/L 33(H) 31 28  Calcium 8.4 - 10.5 mg/dL 10.0 9.7 9.3     Hepatic Function Latest Ref Rng & Units 08/19/2016 10/16/2015  Total Protein 6.1 - 8.1 g/dL 7.6 7.9  Albumin 3.6 - 5.1 g/dL 4.5 4.5  AST 10 - 40 U/L 21 24  ALT 9 - 46 U/L 19 25  Alk Phosphatase 40 - 115 U/L 67 59  Total Bilirubin 0.2 - 1.2 mg/dL 0.5 0.4  Bilirubin, Direct <=0.2 mg/dL 0.1 -    CBC Latest Ref Rng & Units 08/14/2016 08/12/2016 10/16/2015  WBC 3.8 - 10.6 K/uL 8.5 6.1 8.3  Hemoglobin 13.0 - 18.0 g/dL 13.8 12.7(L) 13.8  Hematocrit 40.0 - 52.0 % 39.1(L) 37.2(L) 40.2  Platelets 150 - 440 K/uL 321 345 361   Lab Results  Component Value Date   MCV 84.0 08/14/2016   MCV 82.3 08/12/2016   MCV 84.8 10/16/2015   Lab Results  Component Value Date   TSH 1.18 10/16/2015   Lab Results  Component Value Date   HGBA1C 5.9 05/05/2017     BNP No results found for: BNP  ProBNP No results found for: PROBNP   Lipid Panel     Component Value Date/Time   CHOL 188 08/19/2016 0947   TRIG 138 08/19/2016 0947   HDL 43 08/19/2016 0947   CHOLHDL 4.4 08/19/2016 0947   VLDL 28 08/19/2016 0947   LDLCALC 117 (H) 08/19/2016 0947     RADIOLOGY: No  results found.   Additional studies/ records that were reviewed today include:  I reviewed the records from Dr. Gwenlyn Found.  I reviewed the patient's initial diagnostic polysomnogram, CPAP/BiPAP titration, and obtain download in the office today.    ASSESSMENT:    1. OSA (obstructive sleep apnea)   2. Palpitations   3. Mild obesity      PLAN:  Mr. Bergen Magner is a 37 year old gentleman who has a history of obesity, work-related stress, and palpitations.  He was found to have moderate sleep apnea with severe sleep apnea during rim sleep on his most recent sleep evaluation.  He required BiPAP therapy and was titrated up to 18/14 pressure.  I reviewed his download today.  He is compliant at 100%.  He is only averaging 6 hours and 45 minutes of sleep per night.  I discussed with him the importance of improved sleep duration for at least 7-8 hours if at all possible.  With his increased AHI of 7.8, I am adjusting his BiPAP and will change him to an auto mode with an EPAP minimum of 10 and an IPAP maximum up to 25 if necessary.  He does not have a significant leak with his full face mask.  I discussed with him the importance of continued use and stress with him adverse consequences of untreated sleep apnea with reference to cardiovascular health.  I answered all his questions.  We discussed weight loss We discussed sleep hygiene and maintenance.  I will be available to see him on an as-needed basis if problems arise.   Medication Adjustments/Labs and Tests Ordered: Current medicines are reviewed at length with the patient today.  Concerns regarding medicines are outlined above.  Medication changes, Labs and Tests ordered today are listed in the Patient Instructions below. Patient Instructions  Your physician recommends that you schedule a follow-up appointment in: AS NEEDED with Dr. Claiborne Billings in sleep clinic.     Signed, Joel Majestic, MD  05/15/2017 11:07 AM  [facility] [address] [place], [place], [place]  [address] Phone: [phone]

## 2017-06-02 MED FILL — HYDROCHLOROTHIAZIDE 25 MG T: 25 | 90 days supply | Qty: 90 | Fill #0

## 2017-06-03 MED FILL — BYSTOLIC 10 MG TABLET: 10 | 90 days supply | Qty: 90 | Fill #0

## 2017-06-30 ENCOUNTER — Ambulatory Visit: Payer: 59 | Admitting: Cardiovascular Disease

## 2017-06-30 ENCOUNTER — Encounter: Payer: Self-pay | Admitting: Cardiovascular Disease

## 2017-06-30 DIAGNOSIS — G4733 Obstructive sleep apnea (adult) (pediatric): Secondary | ICD-10-CM

## 2017-06-30 DIAGNOSIS — R002 Palpitations: Secondary | ICD-10-CM

## 2017-06-30 DIAGNOSIS — I10 Essential (primary) hypertension: Secondary | ICD-10-CM | POA: Diagnosis not present

## 2017-06-30 NOTE — Patient Instructions (Signed)
Medication Instructions:  Continue current medications  If you need a refill on your cardiac medications before your next appointment, please call your pharmacy.  Labwork: None Ordered   Testing/Procedures: None Ordered  Follow-Up: Your physician wants you to follow-up in: 1 Year. You should receive a reminder letter in the mail two months in advance. If you do not receive a letter, please call our office [phone].    Thank you for choosing [facility]!!      

## 2017-06-30 NOTE — Assessment & Plan Note (Signed)
History of palpitations most likely primarily related to job related stress as well as excessive caffeinated beverages and obstructive sleep apnea. He was recently begun on BiPAP and he has reduced his caffeine intake. His palpitations have markedly improved.

## 2017-06-30 NOTE — Progress Notes (Signed)
06/30/2017 Joel Marshall   1981-02-08  [id number]  Primary Physician Elby Beck, FNP Primary Cardiologist: Lorretta Harp MD Lupe Carney, [place]  HPI:  Joel Marshall is a 36 y.o. male  moderately overweight married African-American male father of one child whowas referred for new onset palpitations and hypertension. I last saw him in the office  12/29/16. He apparently was seen in the emergency room on 08/12/16  and again 2 days later for symptomatic palpitations. Of note, his company was brought by another company 8 days after Thanksgiving and he's had some issues with transition including financial issues which have caused a significant amount of anxiety and stress. After this, he started to notice palpitations and was seen in the emergency room where EKG showed no acute changes and labs were unremarkable. His blood pressure has been up as well. He admits to drinking a pot of coffee a day which he has subsequently reduced to one cup per day.. There is no family history. He says he is prediabetic. His PCP recently decreased his hydrochlorothiazide from 25 mg daily to 12-1/2 mg daily and began him on Bystolic. He also describes symptoms compatible with obstructive sleep apnea. He continues to have palpitations. He did have an outpatient sleep study last week and we're awaiting that result. He continues to have palpitations which fairly frequently which he attributes to stress. He did have a sleep study which was positive and was prescribed BiPAP by Dr. Claiborne Billings. Since I saw him 6 months ago he has clinically improved. He has more energy on BiPAP and his palpitation frequency has reduced. He denies chest pain or shortness of breath. He does give a fairly detailed log of his symptoms and blood pressures have been under good control.    Current Meds  Medication Sig  . hydrochlorothiazide (HYDRODIURIL) 25 MG tablet   . nebivolol (BYSTOLIC) 10 MG tablet Take 1 tablet (10 mg total) by mouth daily.   . polyethylene glycol (MIRALAX / GLYCOLAX) packet Take 17 g by mouth daily.     No Known Allergies  Social History   Socioeconomic History  . Marital status: Married    Spouse name: Not on file  . Number of children: 1  . Years of education: Not on file  . Highest education level: Not on file  Social Needs  . Financial resource strain: Not on file  . Food insecurity - worry: Not on file  . Food insecurity - inability: Not on file  . Transportation needs - medical: Not on file  . Transportation needs - non-medical: Not on file  Occupational History  . Occupation: Actuary  Tobacco Use  . Smoking status: Never Smoker  . Smokeless tobacco: Never Used  Substance and Sexual Activity  . Alcohol use: No  . Drug use: No  . Sexual activity: No  Other Topics Concern  . Not on file  Social History Narrative  . Not on file     Review of Systems: General: negative for chills, fever, night sweats or weight changes.  Cardiovascular: negative for chest pain, dyspnea on exertion, edema, orthopnea, palpitations, paroxysmal nocturnal dyspnea or shortness of breath Dermatological: negative for rash Respiratory: negative for cough or wheezing Urologic: negative for hematuria Abdominal: negative for nausea, vomiting, diarrhea, bright red blood per rectum, melena, or hematemesis Neurologic: negative for visual changes, syncope, or dizziness All other systems reviewed and are otherwise negative except as noted above.    Blood pressure 131/87, pulse 73,  height 6' 1.5" (1.867 m), weight 254 lb 6.4 oz (115.4 kg), SpO2 99 %.  General appearance: alert and no distress Neck: no adenopathy, no carotid bruit, no JVD, supple, symmetrical, trachea midline and thyroid not enlarged, symmetric, no tenderness/mass/nodules Lungs: clear to auscultation bilaterally Heart: regular rate and rhythm, S1, S2 normal, no murmur, click, rub or gallop Extremities: extremities normal, atraumatic, no cyanosis  or edema Pulses: 2+ and symmetric Skin: Skin color, texture, turgor normal. No rashes or lesions Neurologic: Alert and oriented X 3, normal strength and tone. Normal symmetric reflexes. Normal coordination and gait  EKG not performed today  ASSESSMENT AND PLAN:   Essential hypertension History of essential hypertension blood pressure measured at 131/87. He is on hydrochlorothiazide and Bystolic  Continue current meds at current dosing  Palpitations History of palpitations most likely primarily related to job related stress as well as excessive caffeinated beverages and obstructive sleep apnea. He was recently begun on BiPAP and he has reduced his caffeine intake. His palpitations have markedly improved.  Obstructive sleep apnea History of obstructive sleep apnea currently on BiPAP, clinically improved      Lorretta Harp MD [facility], [facility] 06/30/2017 10:12 AM

## 2017-06-30 NOTE — Assessment & Plan Note (Signed)
History of obstructive sleep apnea currently on BiPAP, clinically improved

## 2017-06-30 NOTE — Assessment & Plan Note (Signed)
History of essential hypertension blood pressure measured at 131/87. He is on hydrochlorothiazide and Bystolic  Continue current meds at current dosing

## 2017-08-30 ENCOUNTER — Other Ambulatory Visit: Payer: Self-pay | Admitting: Cardiovascular Disease

## 2017-08-30 MED FILL — BYSTOLIC 10 MG TABLET: 10 | 90 days supply | Qty: 90 | Fill #1

## 2017-08-30 MED FILL — HYDROCHLOROTHIAZIDE 25 MG T: 25 | 30 days supply | Qty: 30 | Fill #0

## 2017-08-30 NOTE — Telephone Encounter (Signed)
Rx(s) sent to pharmacy electronically.  

## 2017-09-27 MED FILL — HYDROCHLOROTHIAZIDE 25 MG T: 25 | 30 days supply | Qty: 30 | Fill #1

## 2017-10-27 MED FILL — HYDROCHLOROTHIAZIDE 25 MG T: 25 | 30 days supply | Qty: 30 | Fill #2

## 2017-11-29 ENCOUNTER — Other Ambulatory Visit: Payer: Self-pay | Admitting: Cardiovascular Disease

## 2017-11-29 MED FILL — BYSTOLIC 10 MG TABLET: 10 | 90 days supply | Qty: 90 | Fill #0

## 2017-11-29 MED FILL — HYDROCHLOROTHIAZIDE 25 MG T: 25 | 30 days supply | Qty: 30 | Fill #3

## 2017-11-29 NOTE — Telephone Encounter (Signed)
Rx has been sent to the pharmacy electronically. ° °

## 2017-12-06 ENCOUNTER — Telehealth: Payer: Self-pay

## 2017-12-06 NOTE — Telephone Encounter (Signed)
Copied from [place] [phone]. Topic: General - Other >> Dec 06, 2017 12:59 PM Oneta Rack wrote: Relation to pt: self Call back number:[phone]  Reason for call:  Patient scheduled transfer of care appointment with [facility], [place] 12/08/17 and would like a physical conducted at the time, please advise >> Dec 06, 2017  1:03 PM Oneta Rack wrote: Relation to pt: self Call back number:[phone]  Reason for call:  Patient scheduled transfer of care appointment with [place], [place] 12/08/17 and would like his physical conducted at the time, please advise

## 2017-12-06 NOTE — Telephone Encounter (Signed)
Amber, pt can have physical done at the same time as long as there are no problems to discuss.

## 2017-12-07 NOTE — Progress Notes (Signed)
I acted as a Education administrator for [employer], PA-C Anselmo Pickler, LPN  Subjective:    Joel Marshall is a 37 y.o. male and is here to establish care.  HPI  There are no preventive care reminders to display for this patient.  Acute Concerns: HA -- has had HA behind L eye x 1 month. It comes and goes. He states that he strongly thinks that it is stress-related.  No changes in vision. Nose is congested, feels like the pollen is contributing to his symptoms as well. No cough. No SOB/CP/n/v. Has been using CPAP. Has not taken any medication for his symptoms, doesn't like to take medications. Also reports that his 46 y/o nephew was just diagnosed with brain cancer and his symptoms started after he found out. HA pain is 3-8.  Wt Readings from Last 5 Encounters:  12/08/17 253 lb 9.6 oz (115 kg)  06/30/17 254 lb 6.4 oz (115.4 kg)  05/13/17 256 lb 12.8 oz (116.5 kg)  05/05/17 256 lb 12.8 oz (116.5 kg)  03/31/17 255 lb 8 oz (115.9 kg)   Health Maintenance: Immunizations -- UTD Weight -- Weight: 253 lb 9.6 oz (115 kg)   Depression screen [facility] 2/9 12/08/2017  Decreased Interest 0  Down, Depressed, Hopeless 0  PHQ - 2 Score 0  Altered sleeping 0  Tired, decreased energy 2  Change in appetite 0  Feeling bad or failure about yourself  0  Trouble concentrating 0  Moving slowly or fidgety/restless 0  Suicidal thoughts 0  PHQ-9 Score 2  Difficult doing work/chores Not difficult at all    Other providers/specialists: Dr. Gwenlyn Found -- Cardiology   PMHx, SurgHx, SocialHx, Medications, and Allergies were reviewed in the Visit Navigator and updated as appropriate.   Past Medical History:  Diagnosis Date  . Borderline diabetes   . Breast discharge 10/15/2016   white/ forced since 2011  . Breast mass 10/15/2016   behind nipple; R  . History of intestine removal   . Hypertension   . OSA (obstructive sleep apnea)    uses Bipap  . Panic attacks      Past Surgical History:  Procedure Laterality  Date  . SMALL INTESTINE SURGERY  1983   "infected" intestine     Family History  Problem Relation Age of Onset  . Rheum arthritis Mother   . Hypertension Father   . Breast cancer Unknown        cousin  . Colon cancer Unknown        cousin  . HIV Brother     Social History   Tobacco Use  . Smoking status: Never Smoker  . Smokeless tobacco: Never Used  Substance Use Topics  . Alcohol use: No  . Drug use: No    Review of Systems:   ROS  Negative unless otherwise specified per HPI.   Objective:   Vitals:   12/08/17 0756  BP: 110/60  Pulse: 77  Temp: 98.5 F (36.9 C)  SpO2: 96%   Body mass index is 32.56 kg/m.  General Appearance:  Alert, cooperative, no distress, appears stated age  Head:  Normocephalic, without obvious abnormality, atraumatic  Eyes:  PERRL, conjunctiva/corneas clear, EOM's intact, fundi benign, both eyes       Ears:  Normal TM's and external ear canals, both ears  Nose: Nares normal, septum midline, mucosa normal, no drainage    or sinus tenderness  Throat: Lips, mucosa, and tongue normal; teeth and gums normal  Neck: Supple, symmetrical, trachea midline,  no adenopathy; thyroid:  No enlargement/tenderness/nodules; no carotit bruit or JVD  Back:   Symmetric, no curvature, ROM normal, no CVA tenderness  Lungs:   Clear to auscultation bilaterally, respirations unlabored  Chest wall:  No tenderness or deformity  Heart:  Regular rate and rhythm, S1 and S2 normal, no murmur, rub   or gallop  Abdomen:   Soft, non-tender, bowel sounds active all four quadrants, no masses, no organomegaly  Extremities: Extremities normal, atraumatic, no cyanosis or edema     Skin: Skin color, texture, turgor normal, no rashes or lesions  Lymph nodes: Cervical, supraclavicular, and axillary nodes normal  Neuro GCS 15, normal sensation and grip strength, normal gait and balance    Assessment/Plan:   Joel Marshall was seen today for establish care.  Diagnoses and all  orders for this visit:  Encounter to establish care Follow-up in 1 week for CPE.  Nonintractable episodic headache, unspecified headache type No red flags on exam. Patient is reluctant to take any medication for his symptoms. I recommended that he consider antihistamine and flonase. He is going to consider this and also try to work on stress reduction. He is going to see [place] next week for a physical and we will revisit this at this time, he was given ER precautions. I also recommended that he get established with an eye doctor. BP is currently well controlled. I did recommend that he purchase a BP cuff to check his BP when he is having these HA's to see if they are related.  CMA or LPN served as scribe during this visit. History, Physical, and Plan performed by medical provider. Documentation and orders reviewed and attested to.  Inda Coke, PA-C [place]

## 2017-12-08 ENCOUNTER — Ambulatory Visit: Payer: 59 | Admitting: Physician Assistant

## 2017-12-08 ENCOUNTER — Encounter: Payer: Self-pay | Admitting: Physician Assistant

## 2017-12-08 VITALS — BP 110/60 | HR 77 | Temp 98.5°F | Ht 74.0 in | Wt 253.6 lb

## 2017-12-08 DIAGNOSIS — Z7689 Persons encountering health services in other specified circumstances: Secondary | ICD-10-CM | POA: Diagnosis not present

## 2017-12-08 DIAGNOSIS — R51 Headache: Secondary | ICD-10-CM

## 2017-12-08 DIAGNOSIS — R519 Headache, unspecified: Secondary | ICD-10-CM

## 2017-12-08 NOTE — Patient Instructions (Signed)
Please make an appointment to see an eye doctor.  Follow-up with me in 1 week for physical.  If you develop worsening headache symptoms, please let us know. If you have any concerning symptoms -- such as dizziness, vomiting, sudden loss of vision -- please go to the ER. We will discuss this at your next visit.  Consider taking Flonase nasal spray (available over the counter) and a daily allergy pill (such as claritin or allegra) to see if this helps your symptoms.

## 2017-12-14 NOTE — Progress Notes (Signed)
I acted as a Education administrator for [employer], PA-C Anselmo Pickler, LPN  Subjective:    Joel Marshall is a 37 y.o. male and is here for a comprehensive physical exam.  HPI  There are no preventive care reminders to display for this patient.  Acute Concerns: Headaches -- improved since he last saw me, feels as though they are sinus-related. Denies vision changes, issues with balance, dizziness, poor appetite, n/v.  Chronic Issues: OSA -- uses CPAP pretty regularly, maybe misses one time per year; last saw HTN -- Currently taking Bystolic 10 mg and HCTZ 25 mg. Does not check his blood pressure. Patient denies chest pain, SOB, blurred vision, dizziness, unusual headaches, lower leg swelling. Patient is very compliant with medication. Denies excessive caffeine intake, stimulant usage, excessive alcohol intake, or increase in salt consumption. BP Readings from Last 3 Encounters:  12/15/17 126/80  12/08/17 110/60  06/30/17 131/87  Borderline diabetes -- last HgbA1c was 5.9 in Sep 2018. Will check today. Avoids sugary beverages if he can. Unable to exercise 2/2 schedule. Lab Results  Component Value Date   HGBA1C 5.9 05/05/2017    Health Maintenance: Immunizations -- up to date Colonoscopy -- N/A Diet -- tries to avoid sodas - normally just drinks water, not always able to eat full meals with work schedule (sometimes has to skip lunch) Caffeine intake -- very occasional Sleep habits -- sleeps well Exercise -- does not have time for Weight -- Weight: 253 lb 4 oz (114.9 kg)  Mood -- "cynical realist borderline cantankerous"  Wt Readings from Last 5 Encounters:  12/15/17 253 lb 4 oz (114.9 kg)  12/08/17 253 lb 9.6 oz (115 kg)  06/30/17 254 lb 6.4 oz (115.4 kg)  05/13/17 256 lb 12.8 oz (116.5 kg)  05/05/17 256 lb 12.8 oz (116.5 kg)    Depression screen [facility] 2/9 12/08/2017  Decreased Interest 0  Down, Depressed, Hopeless 0  PHQ - 2 Score 0  Altered sleeping 0  Tired, decreased energy 2    Change in appetite 0  Feeling bad or failure about yourself  0  Trouble concentrating 0  Moving slowly or fidgety/restless 0  Suicidal thoughts 0  PHQ-9 Score 2  Difficult doing work/chores Not difficult at all     Other providers/specialists: Cardiology -- Dr. Gwenlyn Found   PMHx, SurgHx, SocialHx, Medications, and Allergies were reviewed in the Visit Navigator and updated as appropriate.   Past Medical History:  Diagnosis Date  . Borderline diabetes   . Breast discharge 10/15/2016   white/ forced since 2011  . Breast mass 10/15/2016   behind nipple; R  . History of intestine removal   . Hypertension   . OSA (obstructive sleep apnea)    uses Bipap  . Panic attacks      Past Surgical History:  Procedure Laterality Date  . SMALL INTESTINE SURGERY  1983   "infected" intestine     Family History  Problem Relation Age of Onset  . Rheum arthritis Mother   . Hypertension Father   . Breast cancer Unknown        cousin  . Colon cancer Unknown 11       cousin  . HIV Brother     Social History   Tobacco Use  . Smoking status: Never Smoker  . Smokeless tobacco: Never Used  Substance Use Topics  . Alcohol use: No  . Drug use: No    Review of Systems:   Review of Systems  Constitutional: Negative.  Negative  for chills, fever, malaise/fatigue and weight loss.  HENT: Negative.  Negative for hearing loss, sinus pain and sore throat.   Eyes: Negative.  Negative for blurred vision.  Respiratory: Negative.  Negative for cough and shortness of breath.   Cardiovascular: Negative for chest pain, palpitations and leg swelling.  Gastrointestinal: Negative.  Negative for abdominal pain, constipation, diarrhea, heartburn, nausea and vomiting.  Genitourinary: Negative.  Negative for dysuria, frequency and urgency.  Musculoskeletal: Negative.  Negative for back pain, myalgias and neck pain.  Skin: Negative.  Negative for itching and rash.  Neurological: Negative.  Negative for  dizziness, tingling, seizures, loss of consciousness and headaches.  Endo/Heme/Allergies: Negative.  Negative for polydipsia.  Psychiatric/Behavioral: Negative.  Negative for depression. The patient is not nervous/anxious.     Objective:   Vitals:   12/15/17 0754  BP: 126/80  Pulse: 72  Temp: 98.5 F (36.9 C)  SpO2: 95%   Body mass index is 32.52 kg/m.  General Appearance:  Alert, cooperative, no distress, appears stated age  Head:  Normocephalic, without obvious abnormality, atraumatic  Eyes:  PERRL, conjunctiva/corneas clear, EOM's intact, fundi benign, both eyes       Ears:  Normal TM's and external ear canals, both ears  Nose: Nares normal, septum midline, mucosa normal, no drainage    or sinus tenderness  Throat: Lips, mucosa, and tongue normal; teeth and gums normal  Neck: Supple, symmetrical, trachea midline, no adenopathy; thyroid:  No enlargement/tenderness/nodules; no carotit bruit or JVD  Back:   Symmetric, no curvature, ROM normal, no CVA tenderness  Lungs:   Clear to auscultation bilaterally, respirations unlabored  Chest wall:  No tenderness or deformity  Heart:  Regular rate and rhythm, S1 and S2 normal, no murmur, rub   or gallop  Abdomen:   Soft, non-tender, bowel sounds active all four quadrants, no masses, no organomegaly  Extremities: Extremities normal, atraumatic, no cyanosis or edema  Prostate: Not done.   Skin: Skin color, texture, turgor normal, no rashes or lesions  Lymph nodes: Cervical, supraclavicular, and axillary nodes normal  Neurologic: CNII-XII grossly intact. Normal strength, sensation and reflexes throughout    Assessment/Plan:   Problem List Items Addressed This Visit      Cardiovascular and Mediastinum   Essential hypertension    Currently well controlled on regimen. Encouraged patient to buy BP cuff and monitor BP periodically. Follow-up in 6 months.      Relevant Medications   hydrochlorothiazide (HYDRODIURIL) 25 MG tablet    Other Relevant Orders   CBC with Differential/Platelet   Comprehensive metabolic panel   Urine Microalbumin w/creat. ratio     Respiratory   Obstructive sleep apnea    Compliant with BiPap. Continue. Work on weight loss.        Other   Class 1 obesity due to excess calories with body mass index (BMI) of 33.0 to 33.9 in adult    Discussed need for weight loss. Patient continues to work on diet. Not interested in medications.      Borderline diabetes    Currently not on medications. Work on weight loss.  Will re-check HgbA1c. He is reluctant to start medications.       Other Visit Diagnoses    Encounter for general adult medical examination with abnormal findings    -  Primary Today patient counseled on age appropriate routine health concerns for screening and prevention, each reviewed and up to date or declined. Immunizations reviewed and up to date or declined. Labs ordered  and reviewed. Risk factors for depression reviewed and negative. Hearing function and visual acuity are intact. ADLs screened and addressed as needed. Functional ability and level of safety reviewed and appropriate. Education, counseling and referrals performed based on assessed risks today. Patient provided with a copy of personalized plan for preventive services.   Encounter for lipid screening for cardiovascular disease       Relevant Orders   Lipid panel   Pre-diabetes     Currently not on meds. Work on weight loss. He is reluctant to start meds. Will update HgbA1c.   Relevant Orders   Hemoglobin A1c   Nonintractable episodic headache, unspecified headache type Improved. No red flags on exam. Follow-up if symptoms worsen or persist.           Well Adult Exam: Labs ordered: Yes. Patient counseling was done. See below for items discussed. Discussed the patient's BMI.  The BMI BMI is not in the acceptable range; BMI management plan is completed Follow up in 6 months.  Patient Counseling: [x]   Nutrition:  Stressed importance of moderation in sodium/caffeine intake, saturated fat and cholesterol, caloric balance, sufficient intake of fresh fruits, vegetables, and fiber.  [x]   Stressed the importance of regular exercise.   []   Substance Abuse: Discussed cessation/primary prevention of tobacco, alcohol, or other drug use; driving or other dangerous activities under the influence; availability of treatment for abuse.   [x]   Injury prevention: Discussed safety belts, safety helmets, smoke detector, smoking near bedding or upholstery.   []   Sexuality: Discussed sexually transmitted diseases, partner selection, use of condoms, avoidance of unintended pregnancy  and contraceptive alternatives.   [x]   Dental health: Discussed importance of regular tooth brushing, flossing, and dental visits.  [x]   Health maintenance and immunizations reviewed. Please refer to Health maintenance section.     CMA or LPN served as scribe during this visit. History, Physical, and Plan performed by medical provider. Documentation and orders reviewed and attested to.  Inda Coke, PA-C [place]

## 2017-12-15 ENCOUNTER — Ambulatory Visit (INDEPENDENT_AMBULATORY_CARE_PROVIDER_SITE_OTHER): Payer: 59 | Admitting: Physician Assistant

## 2017-12-15 ENCOUNTER — Encounter: Payer: Self-pay | Admitting: Physician Assistant

## 2017-12-15 VITALS — BP 126/80 | HR 72 | Temp 98.5°F | Ht 74.0 in | Wt 253.2 lb

## 2017-12-15 DIAGNOSIS — Z136 Encounter for screening for cardiovascular disorders: Secondary | ICD-10-CM

## 2017-12-15 DIAGNOSIS — G4733 Obstructive sleep apnea (adult) (pediatric): Secondary | ICD-10-CM | POA: Diagnosis not present

## 2017-12-15 DIAGNOSIS — E6609 Other obesity due to excess calories: Secondary | ICD-10-CM

## 2017-12-15 DIAGNOSIS — R7303 Prediabetes: Secondary | ICD-10-CM | POA: Diagnosis not present

## 2017-12-15 DIAGNOSIS — R51 Headache: Secondary | ICD-10-CM | POA: Diagnosis not present

## 2017-12-15 DIAGNOSIS — I10 Essential (primary) hypertension: Secondary | ICD-10-CM

## 2017-12-15 DIAGNOSIS — Z13220 Encounter for screening for lipoid disorders: Secondary | ICD-10-CM

## 2017-12-15 DIAGNOSIS — R519 Headache, unspecified: Secondary | ICD-10-CM

## 2017-12-15 DIAGNOSIS — Z6833 Body mass index (BMI) 33.0-33.9, adult: Secondary | ICD-10-CM

## 2017-12-15 DIAGNOSIS — Z0001 Encounter for general adult medical examination with abnormal findings: Secondary | ICD-10-CM

## 2017-12-15 LAB — COMPREHENSIVE METABOLIC PANEL
ALBUMIN: 4.4 g/dL (ref 3.5–5.2)
ALT: 17 U/L (ref 0–53)
AST: 18 U/L (ref 0–37)
Alkaline Phosphatase: 40 U/L (ref 39–117)
BUN: 15 mg/dL (ref 6–23)
CALCIUM: 9.6 mg/dL (ref 8.4–10.5)
CHLORIDE: 99 meq/L (ref 96–112)
CO2: 33 mEq/L — ABNORMAL HIGH (ref 19–32)
CREATININE: 1.31 mg/dL (ref 0.40–1.50)
GFR: 79.07 mL/min (ref >60.00)
Glucose, Bld: 96 mg/dL (ref 70–99)
POTASSIUM: 3.8 meq/L (ref 3.5–5.1)
Sodium: 139 mEq/L (ref 135–145)
Total Bilirubin: 0.6 mg/dL (ref 0.2–1.2)
Total Protein: 7.8 g/dL (ref 6.0–8.3)

## 2017-12-15 LAB — CBC WITH DIFFERENTIAL/PLATELET
BASOS PCT: 1.2 % (ref 0.0–3.0)
Basophils Absolute: 0.1 10*3/uL (ref 0.0–0.1)
Eosinophils Absolute: 0.1 10*3/uL (ref 0.0–0.7)
Eosinophils Relative: 2.2 % (ref 0.0–5.0)
HEMATOCRIT: 37.4 % — AB (ref 39.0–52.0)
HEMOGLOBIN: 13.2 g/dL (ref 13.0–17.0)
LYMPHS PCT: 32.7 % (ref 12.0–46.0)
Lymphs Abs: 2.1 10*3/uL (ref 0.7–4.0)
MCHC: 35.3 g/dL (ref 30.0–36.0)
MCV: 85.2 fl (ref 78.0–100.0)
Monocytes Absolute: 0.6 10*3/uL (ref 0.1–1.0)
Monocytes Relative: 9.8 % (ref 3.0–12.0)
Neutro Abs: 3.5 10*3/uL (ref 1.4–7.7)
Neutrophils Relative %: 54.1 % (ref 43.0–77.0)
Platelets: 330 10*3/uL (ref 150.0–400.0)
RBC: 4.39 Mil/uL (ref 4.22–5.81)
RDW: 13.6 % (ref 11.5–15.5)
WBC: 6.6 10*3/uL (ref 4.0–10.5)

## 2017-12-15 LAB — LIPID PANEL
CHOLESTEROL: 186 mg/dL (ref 0–200)
HDL: 42.5 mg/dL (ref >39.00)
LDL CALC: 111 mg/dL — AB (ref 0–99)
NonHDL: 143.29
Total CHOL/HDL Ratio: 4
Triglycerides: 159 mg/dL — ABNORMAL HIGH (ref 0.0–149.0)
VLDL: 31.8 mg/dL (ref 0.0–40.0)

## 2017-12-15 LAB — MICROALBUMIN / CREATININE URINE RATIO
Creatinine,U: 156.4 mg/dL
MICROALB/CREAT RATIO: 0.4 mg/g (ref 0.0–30.0)

## 2017-12-15 LAB — HEMOGLOBIN A1C: Hgb A1c MFr Bld: 5.9 % (ref 4.6–6.5)

## 2017-12-15 MED ORDER — HYDROCHLOROTHIAZIDE 25 MG PO TABS: 25.0000 mg | ORAL_TABLET | Freq: Every day | ORAL | 5 refills | Status: DC

## 2017-12-15 NOTE — Assessment & Plan Note (Signed)
Compliant with BiPap. Continue. Work on weight loss.

## 2017-12-15 NOTE — Assessment & Plan Note (Signed)
Currently well controlled on regimen. Encouraged patient to buy BP cuff and monitor BP periodically. Follow-up in 6 months.

## 2017-12-15 NOTE — Assessment & Plan Note (Signed)
Currently not on medications. Work on weight loss.  Will re-check HgbA1c. He is reluctant to start medications.

## 2017-12-15 NOTE — Assessment & Plan Note (Signed)
Discussed need for weight loss. Patient continues to work on diet. Not interested in medications.

## 2017-12-15 NOTE — Patient Instructions (Signed)
It was great to see you!  Let's follow-up in 6 months for your blood pressure. If your blood sugar is elevated, we will need to see each other sooner.  Keep me posted about your headaches.   Health Maintenance, Male A healthy lifestyle and preventive care is important for your health and wellness. Ask your health care provider about what schedule of regular examinations is right for you. What should I know about weight and diet? Eat a Healthy Diet  Eat plenty of vegetables, fruits, whole grains, low-fat dairy products, and lean protein.  Do not eat a lot of foods high in solid fats, added sugars, or salt.  Maintain a Healthy Weight Regular exercise can help you achieve or maintain a healthy weight. You should:  Do at least 150 minutes of exercise each week. The exercise should increase your heart rate and make you sweat (moderate-intensity exercise).  Do strength-training exercises at least twice a week.  Watch Your Levels of Cholesterol and Blood Lipids  Have your blood tested for lipids and cholesterol every 5 years starting at 37 years of age. If you are at high risk for heart disease, you should start having your blood tested when you are 37 years old. You may need to have your cholesterol levels checked more often if: ? Your lipid or cholesterol levels are high. ? You are older than 37 years of age. ? You are at high risk for heart disease.  What should I know about cancer screening? Many types of cancers can be detected early and may often be prevented. Lung Cancer  You should be screened every year for lung cancer if: ? You are a current smoker who has smoked for at least 30 years. ? You are a former smoker who has quit within the past 15 years.  Talk to your health care provider about your screening options, when you should start screening, and how often you should be screened.  Colorectal Cancer  Routine colorectal cancer screening usually begins at 37 years of age  and should be repeated every 5-10 years until you are 37 years old. You may need to be screened more often if early forms of precancerous polyps or small growths are found. Your health care provider may recommend screening at an earlier age if you have risk factors for colon cancer.  Your health care provider may recommend using home test kits to check for hidden blood in the stool.  A small camera at the end of a tube can be used to examine your colon (sigmoidoscopy or colonoscopy). This checks for the earliest forms of colorectal cancer.  Prostate and Testicular Cancer  Depending on your age and overall health, your health care provider may do certain tests to screen for prostate and testicular cancer.  Talk to your health care provider about any symptoms or concerns you have about testicular or prostate cancer.  Skin Cancer  Check your skin from head to toe regularly.  Tell your health care provider about any new moles or changes in moles, especially if: ? There is a change in a mole's size, shape, or color. ? You have a mole that is larger than a pencil eraser.  Always use sunscreen. Apply sunscreen liberally and repeat throughout the day.  Protect yourself by wearing long sleeves, pants, a wide-brimmed hat, and sunglasses when outside.  What should I know about heart disease, diabetes, and high blood pressure?  If you are 66-60 years of age, have your blood pressure  checked every 3-5 years. If you are 32 years of age or older, have your blood pressure checked every year. You should have your blood pressure measured twice-once when you are at a hospital or clinic, and once when you are not at a hospital or clinic. Record the average of the two measurements. To check your blood pressure when you are not at a hospital or clinic, you can use: ? An automated blood pressure machine at a pharmacy. ? A home blood pressure monitor.  Talk to your health care provider about your target blood  pressure.  If you are between 24-80 years old, ask your health care provider if you should take aspirin to prevent heart disease.  Have regular diabetes screenings by checking your fasting blood sugar level. ? If you are at a normal weight and have a low risk for diabetes, have this test once every three years after the age of 67. ? If you are overweight and have a high risk for diabetes, consider being tested at a younger age or more often.  A one-time screening for abdominal aortic aneurysm (AAA) by ultrasound is recommended for men aged 28-75 years who are current or former smokers. What should I know about preventing infection? Hepatitis B If you have a higher risk for hepatitis B, you should be screened for this virus. Talk with your health care provider to find out if you are at risk for hepatitis B infection. Hepatitis C Blood testing is recommended for:  Everyone born from [birth year] through 1965.  Anyone with known risk factors for hepatitis C.  Sexually Transmitted Diseases (STDs)  You should be screened each year for STDs including gonorrhea and chlamydia if: ? You are sexually active and are younger than 37 years of age. ? You are older than 37 years of age and your health care provider tells you that you are at risk for this type of infection. ? Your sexual activity has changed since you were last screened and you are at an increased risk for chlamydia or gonorrhea. Ask your health care provider if you are at risk.  Talk with your health care provider about whether you are at high risk of being infected with HIV. Your health care provider may recommend a prescription medicine to help prevent HIV infection.  What else can I do?  Schedule regular health, dental, and eye exams.  Stay current with your vaccines (immunizations).  Do not use any tobacco products, such as cigarettes, chewing tobacco, and e-cigarettes. If you need help quitting, ask your health care  provider.  Limit alcohol intake to no more than 2 drinks per day. One drink equals 12 ounces of beer, 5 ounces of wine, or 1 ounces of hard liquor.  Do not use street drugs.  Do not share needles.  Ask your health care provider for help if you need support or information about quitting drugs.  Tell your health care provider if you often feel depressed.  Tell your health care provider if you have ever been abused or do not feel safe at home. This information is not intended to replace advice given to you by your health care provider. Make sure you discuss any questions you have with your health care provider. Document Released: 01/30/2008 Document Revised: 04/01/2016 Document Reviewed: 05/07/2015 Elsevier Interactive Patient Education  [employer].

## 2017-12-23 MED FILL — HYDROCHLOROTHIAZIDE 25 MG T: 25 | 30 days supply | Qty: 30 | Fill #0

## 2018-01-24 MED FILL — HYDROCHLOROTHIAZIDE 25 MG T: 25 | 30 days supply | Qty: 30 | Fill #1

## 2018-03-02 MED FILL — BYSTOLIC 10 MG TABLET: 10 | 90 days supply | Qty: 90 | Fill #1

## 2018-03-02 MED FILL — HYDROCHLOROTHIAZIDE 25 MG T: 25 | 30 days supply | Qty: 30 | Fill #2

## 2018-03-29 MED FILL — HYDROCHLOROTHIAZIDE 25 MG T: 25 | 30 days supply | Qty: 30 | Fill #3

## 2018-04-28 MED FILL — HYDROCHLOROTHIAZIDE 25 MG T: 25 | 30 days supply | Qty: 30 | Fill #4

## 2018-05-26 MED FILL — BYSTOLIC 10 MG TABLET: 10 | 90 days supply | Qty: 90 | Fill #2

## 2018-05-26 MED FILL — HYDROCHLOROTHIAZIDE 25 MG T: 25 | 30 days supply | Qty: 30 | Fill #5

## 2018-06-07 MED FILL — SM BLOOD PRESSURE MONITOR: 1 days supply | Qty: 1 | Fill #0

## 2018-06-30 MED FILL — HYDROCHLOROTHIAZIDE 25 MG T: 25 | 30 days supply | Qty: 30 | Fill #4

## 2018-07-05 ENCOUNTER — Ambulatory Visit: Payer: 59 | Admitting: Physician Assistant

## 2018-07-08 ENCOUNTER — Encounter: Payer: Self-pay | Admitting: Physician Assistant

## 2018-07-08 ENCOUNTER — Ambulatory Visit (INDEPENDENT_AMBULATORY_CARE_PROVIDER_SITE_OTHER): Payer: 59 | Admitting: Physician Assistant

## 2018-07-08 VITALS — BP 122/68 | HR 71 | Temp 98.7°F | Ht 74.0 in | Wt 258.2 lb

## 2018-07-08 DIAGNOSIS — R7303 Prediabetes: Secondary | ICD-10-CM | POA: Diagnosis not present

## 2018-07-08 DIAGNOSIS — E785 Hyperlipidemia, unspecified: Secondary | ICD-10-CM | POA: Diagnosis not present

## 2018-07-08 DIAGNOSIS — Z23 Encounter for immunization: Secondary | ICD-10-CM | POA: Diagnosis not present

## 2018-07-08 DIAGNOSIS — I10 Essential (primary) hypertension: Secondary | ICD-10-CM | POA: Diagnosis not present

## 2018-07-08 LAB — LIPID PANEL
CHOLESTEROL: 194 mg/dL (ref 0–200)
HDL: 47.8 mg/dL (ref >39.00)
LDL Cholesterol: 116 mg/dL — ABNORMAL HIGH (ref 0–99)
NonHDL: 146.35
TRIGLYCERIDES: 151 mg/dL — AB (ref 0.0–149.0)
Total CHOL/HDL Ratio: 4
VLDL: 30.2 mg/dL (ref 0.0–40.0)

## 2018-07-08 LAB — BASIC METABOLIC PANEL
BUN: 16 mg/dL (ref 6–23)
CALCIUM: 10.1 mg/dL (ref 8.4–10.5)
CO2: 34 meq/L — AB (ref 19–32)
CREATININE: 1.27 mg/dL (ref 0.40–1.50)
Chloride: 102 mEq/L (ref 96–112)
GFR: 81.7 mL/min (ref >60.00)
GLUCOSE: 91 mg/dL (ref 70–99)
Potassium: 4.6 mEq/L (ref 3.5–5.1)
Sodium: 142 mEq/L (ref 135–145)

## 2018-07-08 LAB — HEMOGLOBIN A1C: HEMOGLOBIN A1C: 5.9 % (ref 4.6–6.5)

## 2018-07-08 NOTE — Progress Notes (Signed)
Joel Marshall is a 37 y.o. male here for a new problem.  History of Present Illness:   No chief complaint on file.   HPI   Hypertension Currently taking Bystolic 10 mg, HCTZ 25 mg. At home blood pressure readings are: <140/90. Patient denies chest pain, SOB, blurred vision, dizziness, unusual headaches, lower leg swelling. Patient is compliant with medication. Denies excessive caffeine intake, stimulant usage, excessive alcohol intake, or increase in salt consumption.  BP Readings from Last 3 Encounters:  07/08/18 122/68  12/15/17 126/80  12/08/17 110/60    Insulin resistance Does endorse some recent increase in cravings. Drinking sweet tea occasionally. Has had about 5 lb weight gain last visit. Walks around a lot at work.  Wt Readings from Last 3 Encounters:  07/08/18 258 lb 4 oz (117.1 kg)  12/15/17 253 lb 4 oz (114.9 kg)  12/08/17 253 lb 9.6 oz (115 kg)     Hyperlipidemia He continues to have high fat diet, per his report. Eats pizza and other fast food regularly. Has a baby on the way so he is having some "sympathy symptoms" and has had some increase in cravings.    Past Medical History:  Diagnosis Date  . Borderline diabetes   . Breast mass 10/15/2016   behind nipple; R  . History of intestine removal   . Panic attacks      Social History   Socioeconomic History  . Marital status: Married    Spouse name: Not on file  . Number of children: 1  . Years of education: Not on file  . Highest education level: Not on file  Occupational History  . Occupation: Actuary  Social Needs  . Financial resource strain: Not on file  . Food insecurity:    Worry: Not on file    Inability: Not on file  . Transportation needs:    Medical: Not on file    Non-medical: Not on file  Tobacco Use  . Smoking status: Never Smoker  . Smokeless tobacco: Never Used  Substance and Sexual Activity  . Alcohol use: No  . Drug use: No  . Sexual activity: Never  Lifestyle  .  Physical activity:    Days per week: Not on file    Minutes per session: Not on file  . Stress: Not on file  Relationships  . Social connections:    Talks on phone: Not on file    Gets together: Not on file    Attends religious service: Not on file    Active member of club or organization: Not on file    Attends meetings of clubs or organizations: Not on file    Relationship status: Not on file  . Intimate partner violence:    Fear of current or ex partner: Not on file    Emotionally abused: Not on file    Physically abused: Not on file    Forced sexual activity: Not on file  Other Topics Concern  . 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 SMALL INTESTINE SURGERY  1983   "infected" intestine    Family History  Problem Relation Age of Onset  . Rheum arthritis Mother   . Hypertension Father   . Breast cancer Unknown        cousin  . Colon cancer Unknown 50  cousin  . HIV Brother     No Known Allergies  Current Medications:   Current Outpatient Medications:  .  BYSTOLIC 10 MG tablet, TAKE 1 TABLET BY MOUTH DAILY., Disp: 90 tablet, Rfl: 2 .  hydrochlorothiazide (HYDRODIURIL) 25 MG tablet, Take 1 tablet (25 mg total) by mouth daily., Disp: 30 tablet, Rfl: 5 .  polyethylene glycol (MIRALAX / GLYCOLAX) packet, Take 17 g by mouth daily., Disp: , Rfl:    Review of Systems:   ROS  Negative unless otherwise specified per HPI.  Vitals:   Vitals:   07/08/18 0828  BP: 122/68  Pulse: 71  Temp: 98.7 F (37.1 C)  TempSrc: Oral  SpO2: 96%  Weight: 258 lb 4 oz (117.1 kg)  Height: 6\' 2"  (1.88 m)     Body mass index is 33.16 kg/m.  Physical Exam:   Physical Exam  Constitutional: He appears well-developed. He is cooperative.  Non-toxic appearance. He does not have a sickly appearance. He does not appear ill. No  distress.  Cardiovascular: Normal rate, regular rhythm, S1 normal, S2 normal, normal heart sounds and normal pulses.  No LE edema  Pulmonary/Chest: Effort normal and breath sounds normal.  Neurological: He is alert. GCS eye subscore is 4. GCS verbal subscore is 5. GCS motor subscore is 6.  Skin: Skin is warm, dry and intact.  Psychiatric: He has a normal mood and affect. His speech is normal and behavior is normal.  Nursing note and vitals reviewed.     Assessment and Plan:   Diagnoses and all orders for this visit:  Pre-diabetes Re-check HgbA1c today. He is agreeable to Metformin if HgbA1c is > 6.0. Follow-up in 3 months. We discussed need for weight loss. -     Hemoglobin A1c  Need for prophylactic vaccination and inoculation against influenza -     Flu Vaccine QUAD 6+ mos PF IM (Fluarix Quad PF)  Essential hypertension Well controlled. Continue current plan. -     Basic metabolic panel  Hyperlipidemia, unspecified hyperlipidemia type Re-check today. -     Lipid panel  . Reviewed expectations re: course of current medical issues. . Discussed self-management of symptoms. . Outlined signs and symptoms indicating need for more acute intervention. . Patient verbalized understanding and all questions were answered. . See orders for this visit as documented in the electronic medical record. . Patient received an After-Visit Summary.  CMA or LPN served as scribe during this visit. History, Physical, and Plan performed by medical provider. The above documentation has been reviewed and is accurate and complete.   Inda Coke, PA-C

## 2018-07-08 NOTE — Patient Instructions (Signed)
It was great to see you!  Work on eating healthy and exercise!  Congrats on the little one on the way.  Let's follow-up in 3 months, sooner if you have concerns.  Take care,  Inda Coke PA-C

## 2018-07-13 ENCOUNTER — Ambulatory Visit: Payer: 59 | Admitting: Cardiovascular Disease

## 2018-07-13 ENCOUNTER — Encounter: Payer: Self-pay | Admitting: Cardiovascular Disease

## 2018-07-13 DIAGNOSIS — R002 Palpitations: Secondary | ICD-10-CM

## 2018-07-13 DIAGNOSIS — I10 Essential (primary) hypertension: Secondary | ICD-10-CM | POA: Diagnosis not present

## 2018-07-13 DIAGNOSIS — G4733 Obstructive sleep apnea (adult) (pediatric): Secondary | ICD-10-CM | POA: Diagnosis not present

## 2018-07-13 NOTE — Patient Instructions (Signed)
Medication Instructions:  NONE If you need a refill on your cardiac medications before your next appointment, please call your pharmacy.   Lab work: NONE If you have labs (blood work) drawn today and your tests are completely normal, you will receive your results only by: [name] Kitchen MyChart Message (if you have MyChart) OR . A paper copy in the mail If you have any lab test that is abnormal or we need to change your treatment, we will call you to review the results.  Testing/Procedures: NONE  Follow-Up: At [facility], you and your health needs are our priority.  As part of our continuing mission to provide you with exceptional heart care, we have created designated Provider Care Teams.  These Care Teams include your primary Cardiologist (physician) and Advanced Practice Providers (APPs -  Physician Assistants and Nurse Practitioners) who all work together to provide you with the care you need, when you need it.  You may see DR. BERRY or one of the following Advanced Practice Providers on your designated Care Team AS NEEDED:   Kerin Ransom, PA-C [address], [place] . Sande Rives, PA-C

## 2018-07-13 NOTE — Progress Notes (Signed)
07/13/2018 Joel Marshall   06-12-1981  [id number]  Primary Physician Inda Coke, [place] Primary Cardiologist: Lorretta Harp MD Lupe Carney, [place]  HPI:  Joel Marshall is a 37 y.o.  moderatelyoverweight married African-American male father of one child (1 to child on the way) whowas referred for new onset palpitations and hypertension. I last saw him in the office  06/30/2017. He apparently was seen in the emergency room on 12/27/17and again 2 days later for symptomatic palpitations. Of note, his company wasbrought by another company 8 days after Thanksgiving and he's had some issues with transition including financial issues which have caused a significant amount of anxiety and stress. After this, he started to notice palpitations and was seen in the emergency room where EKG showed no acute changes and labs were unremarkable. His blood pressure has been up as well. He admits to drinking a potof coffee a day which he has subsequently reduced to one cup per day.. There is no family history. He says he is prediabetic. His PCP recently decreased his hydrochlorothiazide from 25 mg daily to 12-1/2 mg daily and began him on Bystolic. He also describes symptoms compatible with obstructive sleep apnea. He continues to have palpitations. He did have an outpatient sleep study last week and we're awaiting that result. He continues to have palpitations whichfairly frequently which he attributes to stress. He did have a sleep study which was positive and was prescribed BiPAP by Dr. Claiborne Billings. Since I saw him 6 months ago he has clinically improved. He has more energy on BiPAP and his palpitation frequency has reduced.  He does give a fairly detailed log of his symptoms and blood pressures have been under good control. Since I saw him a year ago his symptoms have improved.  His caffeine intake has declined.  His stress is at a manageable level.  He has a new child on the way which she is excited about.  His  lipid profile is acceptable for primary prevention.  He denies chest pain or shortness of breath.   Current Meds  Medication Sig  . BYSTOLIC 10 MG tablet TAKE 1 TABLET BY MOUTH DAILY.  . hydrochlorothiazide (HYDRODIURIL) 25 MG tablet Take 1 tablet (25 mg total) by mouth daily.  . polyethylene glycol (MIRALAX / GLYCOLAX) packet Take 17 g by mouth daily.     No Known Allergies  Social History   Socioeconomic History  . Marital status: Married    Spouse name: Not on file  . Number of children: 1  . Years of education: Not on file  . Highest education level: Not on file  Occupational History  . Occupation: Actuary  Social Needs  . Financial resource strain: Not on file  . Food insecurity:    Worry: Not on file    Inability: Not on file  . Transportation needs:    Medical: Not on file    Non-medical: Not on file  Tobacco Use  . Smoking status: Never Smoker  . Smokeless tobacco: Never Used  Substance and Sexual Activity  . Alcohol use: No  . Drug use: No  . Sexual activity: Never  Lifestyle  . Physical activity:    Days per week: Not on file    Minutes per session: Not on file  . Stress: Not on file  Relationships  . Social connections:    Talks on phone: Not on file    Gets together: Not on file    Attends religious service:  Not on file    Active member of club or organization: Not on file    Attends meetings of clubs or organizations: Not on file    Relationship status: Not on file  . Intimate partner violence:    Fear of current or ex partner: Not on file    Emotionally abused: Not on file    Physically abused: Not on file    Forced sexual activity: Not on file  Other Topics Concern  . 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  Cardiovascular: negative for chest pain, dyspnea  on exertion, edema, orthopnea, palpitations, paroxysmal nocturnal dyspnea or shortness of breath Dermatological: negative for rash Respiratory: negative for cough or wheezing Urologic: negative for hematuria Abdominal: negative for nausea, vomiting, diarrhea, bright red blood per rectum, melena, or hematemesis Neurologic: negative for visual changes, syncope, or dizziness All other systems reviewed and are otherwise negative except as noted above.    Blood pressure 116/70, pulse 78, height 6\' 2"  (1.88 m), weight 255 lb (115.7 kg).  General appearance: alert and no distress Neck: no adenopathy, no carotid bruit, no JVD, supple, symmetrical, trachea midline and thyroid not enlarged, symmetric, no tenderness/mass/nodules Lungs: clear to auscultation bilaterally Heart: regular rate and rhythm, S1, S2 normal, no murmur, click, rub or gallop Extremities: extremities normal, atraumatic, no cyanosis or edema Pulses: 2+ and symmetric Skin: Skin color, texture, turgor normal. No rashes or lesions Neurologic: Alert and oriented X 3, normal strength and tone. Normal symmetric reflexes. Normal coordination and gait  EKG sinus rhythm at 78 nonspecific ST and T wave changes. I Personally reviewed this EKG.  ASSESSMENT AND PLAN:   Essential hypertension History of essential hypertension blood pressure measured today 116/70.  He is on Bystolic and hydrochlorothiazide.  Continue current meds at current dosing  Palpitations History of palpitations in the past thought to be related to stress, caffeine and obstructive sleep apnea all of which have been adequately addressed in his palpitations of markedly improved in frequency and severity.  Obstructive sleep apnea History of obstructive sleep apnea on BiPAP which he benefits from      Lorretta Harp MD [facility], [facility] [place] [facility] 07/13/2018 9:00 AM

## 2018-07-13 NOTE — Assessment & Plan Note (Signed)
History of obstructive sleep apnea on BiPAP which he benefits from

## 2018-07-13 NOTE — Assessment & Plan Note (Signed)
History of essential hypertension blood pressure measured today 116/70.  He is on Bystolic and hydrochlorothiazide.  Continue current meds at current dosing

## 2018-07-13 NOTE — Assessment & Plan Note (Signed)
History of palpitations in the past thought to be related to stress, caffeine and obstructive sleep apnea all of which have been adequately addressed in his palpitations of markedly improved in frequency and severity.

## 2018-08-01 MED FILL — HYDROCHLOROTHIAZIDE 25 MG T: 25 | 30 days supply | Qty: 30 | Fill #5

## 2018-08-31 ENCOUNTER — Other Ambulatory Visit: Payer: Self-pay | Admitting: Cardiovascular Disease

## 2018-08-31 MED FILL — BYSTOLIC 10 MG TABLET: 10 | 90 days supply | Qty: 90 | Fill #0

## 2018-08-31 MED FILL — HYDROCHLOROTHIAZIDE 25 MG T: 25 | 30 days supply | Qty: 30 | Fill #0

## 2018-10-03 MED FILL — HYDROCHLOROTHIAZIDE 25 MG T: 25 | 30 days supply | Qty: 30 | Fill #1

## 2018-10-05 ENCOUNTER — Encounter: Payer: Self-pay | Admitting: Physician Assistant

## 2018-10-05 ENCOUNTER — Ambulatory Visit (INDEPENDENT_AMBULATORY_CARE_PROVIDER_SITE_OTHER): Payer: No Typology Code available for payment source | Admitting: Physician Assistant

## 2018-10-05 VITALS — BP 130/84 | HR 76 | Temp 98.3°F | Ht 74.0 in | Wt 261.5 lb

## 2018-10-05 DIAGNOSIS — I10 Essential (primary) hypertension: Secondary | ICD-10-CM | POA: Diagnosis not present

## 2018-10-05 DIAGNOSIS — L84 Corns and callosities: Secondary | ICD-10-CM

## 2018-10-05 DIAGNOSIS — R7303 Prediabetes: Secondary | ICD-10-CM

## 2018-10-05 DIAGNOSIS — L989 Disorder of the skin and subcutaneous tissue, unspecified: Secondary | ICD-10-CM

## 2018-10-05 NOTE — Progress Notes (Addendum)
Joel Marshall is a 38 y.o. male here for a follow up of a pre-existing problem.   History of Present Illness:   Chief Complaint  Patient presents with  . f/u on pre-diabetes    HPI   HTN -- Currently taking Bystolic 10 mg daily, HCTZ 25 mg. At home blood pressure readings are: well controlled. Patient denies chest pain, SOB, blurred vision, dizziness, unusual headaches, lower leg swelling. Patient is compliant with medication. Denies excessive caffeine intake, stimulant usage, excessive alcohol intake, or increase in salt consumption.  BP Readings from Last 3 Encounters:  10/05/18 130/84  07/13/18 116/70  07/08/18 122/68   Insulin resistance -- has had weight gain since last her. Feels like he is gaining his wife's pregnancy weight -- having a lot of cravings. Not exercising as much as he should.  Wt Readings from Last 3 Encounters:  10/05/18 261 lb 8 oz (118.6 kg)  07/13/18 255 lb (115.7 kg)  07/08/18 258 lb 4 oz (117.1 kg)   Skin lesion -- has noticed two small nodules on the outer corners of bilateral eyes. No erythema, discharge, pain, changes in vision. Hasn't tried anything for symptoms. Areas seem to be enlarging with time.  Plantar callus -- bilateral feet with calluses to great toes. He is interested in intervention. Has not tried anything for this.   Past Medical History:  Diagnosis Date  . Borderline diabetes   . Breast mass 10/15/2016   behind nipple; R  . History of intestine removal   . Panic attacks      Social History   Socioeconomic History  . Marital status: Married    Spouse name: Not on file  . Number of children: 1  . Years of education: Not on file  . Highest education level: Not on file  Occupational History  . Occupation: Actuary  Social Needs  . Financial resource strain: Not on file  . Food insecurity:    Worry: Not on file    Inability: Not on file  . Transportation needs:    Medical: Not on file    Non-medical: Not on file   Tobacco Use  . Smoking status: Never Smoker  . Smokeless tobacco: Never Used  Substance and Sexual Activity  . Alcohol use: No  . Drug use: No  . Sexual activity: Never  Lifestyle  . Physical activity:    Days per week: Not on file    Minutes per session: Not on file  . Stress: Not on file  Relationships  . Social connections:    Talks on phone: Not on file    Gets together: Not on file    Attends religious service: Not on file    Active member of club or organization: Not on file    Attends meetings of clubs or organizations: Not on file    Relationship status: Not on file  . Intimate partner violence:    Fear of current or ex partner: Not on file    Emotionally abused: Not on file    Physically abused: Not on file    Forced sexual activity: Not on file  Other Topics Concern  . 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 SMALL INTESTINE SURGERY  1983   "infected" intestine    Family History  Problem Relation Age of Onset  .  Rheum arthritis Mother   . Hypertension Father   . Breast cancer Unknown        cousin  . Colon cancer Unknown 68       cousin  . HIV Brother     No Known Allergies  Current Medications:   Current Outpatient Medications:  .  BYSTOLIC 10 MG tablet, TAKE 1 TABLET BY MOUTH DAILY., Disp: 90 tablet, Rfl: 2 .  hydrochlorothiazide (HYDRODIURIL) 25 MG tablet, TAKE 1 TABLET BY MOUTH DAILY., Disp: 30 tablet, Rfl: 10 .  polyethylene glycol (MIRALAX / GLYCOLAX) packet, Take 17 g by mouth daily., Disp: , Rfl:    Review of Systems:   Review of Systems  Constitutional: Negative for chills, fever, malaise/fatigue and weight loss.  Respiratory: Negative for shortness of breath.   Cardiovascular: Negative for chest pain, orthopnea, claudication and leg swelling.  Gastrointestinal: Negative for heartburn, nausea  and vomiting.  Neurological: Negative for dizziness, tingling and headaches.    Vitals:   Vitals:   10/05/18 1504  BP: 130/84  Pulse: 76  Temp: 98.3 F (36.8 C)  TempSrc: Oral  SpO2: 95%  Weight: 261 lb 8 oz (118.6 kg)  Height: 6\' 2"  (1.88 m)     Body mass index is 33.57 kg/m.  Physical Exam:   Physical Exam Vitals signs and nursing note reviewed.  Constitutional:      General: He is not in acute distress.    Appearance: He is well-developed. He is not ill-appearing or toxic-appearing.  Eyes:     Comments: Mobile flesh-colored nodule to bilateral canthus  Cardiovascular:     Rate and Rhythm: Normal rate and regular rhythm.     Pulses: Normal pulses.     Heart sounds: Normal heart sounds, S1 normal and S2 normal.     Comments: No LE edema Pulmonary:     Effort: Pulmonary effort is normal.     Breath sounds: Normal breath sounds.  Feet:     Right foot:     Skin integrity: Callus (L great toe) present.     Left foot:     Skin integrity: Callus (R great toe) present.  Skin:    General: Skin is warm and dry.  Neurological:     Mental Status: He is alert.     GCS: GCS eye subscore is 4. GCS verbal subscore is 5. GCS motor subscore is 6.  Psychiatric:        Speech: Speech normal.        Behavior: Behavior normal. Behavior is cooperative.      Assessment and Plan:   Cy was seen today for f/u on pre-diabetes.  Diagnoses and all orders for this visit:  Essential hypertension Well-controlled. Continue regimen. Followed by cards. -     Basic metabolic panel; Future  Pre-diabetes Too soon for HgbA1c recheck. Has lab appt for next week. He is very reluctant to start Metformin, will consider if HgbA1c is 6.0. -     Hemoglobin A1c; Future  Plantar callus Referral to podiatry.  Skin lesion Referral to dermatology.  . Reviewed expectations re: course of current medical issues. . Discussed self-management of symptoms. . Outlined signs and symptoms  indicating need for more acute intervention. . Patient verbalized understanding and all questions were answered. . See orders for this visit as documented in the electronic medical record. . Patient received an After-Visit Summary.   Inda Coke, PA-C

## 2018-10-05 NOTE — Patient Instructions (Signed)
It was great to see you!  You will be contacted about your referral to dermatology and podiatry.  Take care,  Inda Coke PA-C

## 2018-10-05 NOTE — Addendum Note (Signed)
Addended by: Erlene Quan on: 10/05/2018 07:09 PM   Modules accepted: Orders, Level of Service

## 2018-10-07 ENCOUNTER — Ambulatory Visit: Payer: 59 | Admitting: Physician Assistant

## 2018-10-10 ENCOUNTER — Other Ambulatory Visit (INDEPENDENT_AMBULATORY_CARE_PROVIDER_SITE_OTHER): Payer: No Typology Code available for payment source

## 2018-10-10 DIAGNOSIS — R7303 Prediabetes: Secondary | ICD-10-CM

## 2018-10-10 DIAGNOSIS — I10 Essential (primary) hypertension: Secondary | ICD-10-CM | POA: Diagnosis not present

## 2018-10-10 LAB — BASIC METABOLIC PANEL
BUN: 12 mg/dL (ref 6–23)
CALCIUM: 9.6 mg/dL (ref 8.4–10.5)
CHLORIDE: 98 meq/L (ref 96–112)
CO2: 35 mEq/L — ABNORMAL HIGH (ref 19–32)
CREATININE: 1.37 mg/dL (ref 0.40–1.50)
GFR: 70.34 mL/min (ref >60.00)
Glucose, Bld: 96 mg/dL (ref 70–99)
Potassium: 4.1 mEq/L (ref 3.5–5.1)
Sodium: 140 mEq/L (ref 135–145)

## 2018-10-10 LAB — HEMOGLOBIN A1C: Hgb A1c MFr Bld: 5.8 % (ref 4.6–6.5)

## 2018-10-19 ENCOUNTER — Telehealth: Payer: Self-pay

## 2018-10-19 ENCOUNTER — Other Ambulatory Visit: Payer: Self-pay

## 2018-10-19 ENCOUNTER — Encounter: Payer: Self-pay | Admitting: Physician Assistant

## 2018-10-19 DIAGNOSIS — R002 Palpitations: Secondary | ICD-10-CM

## 2018-10-19 NOTE — Progress Notes (Signed)
amb  

## 2018-10-20 ENCOUNTER — Ambulatory Visit: Payer: No Typology Code available for payment source | Admitting: Cardiovascular Disease

## 2018-10-20 ENCOUNTER — Encounter: Payer: Self-pay | Admitting: Cardiovascular Disease

## 2018-10-20 DIAGNOSIS — E785 Hyperlipidemia, unspecified: Secondary | ICD-10-CM | POA: Insufficient documentation

## 2018-10-20 DIAGNOSIS — E782 Mixed hyperlipidemia: Secondary | ICD-10-CM

## 2018-10-20 DIAGNOSIS — G4733 Obstructive sleep apnea (adult) (pediatric): Secondary | ICD-10-CM | POA: Diagnosis not present

## 2018-10-20 DIAGNOSIS — I10 Essential (primary) hypertension: Secondary | ICD-10-CM

## 2018-10-20 DIAGNOSIS — R002 Palpitations: Secondary | ICD-10-CM

## 2018-10-20 NOTE — Progress Notes (Signed)
10/20/2018 Joel Marshall   11-24-1980  [id number]  Primary Physician Inda Coke, [place] Primary Cardiologist: Lorretta Harp MD Lupe Carney, [place]  HPI:  Joel Marshall is a 38 y.o.  moderatelyoverweight married African-American male father of one child (1 to child on the way) whowas referred for new onset palpitations and hypertension. I last saw him in the office  07/13/2018. He apparently was seen in the emergency room on 12/27/17and again 2 days later for symptomatic palpitations. Of note, his company wasbrought by another company 8 days after Thanksgiving and he's had some issues with transition including financial issues which have caused a significant amount of anxiety and stress. After this, he started to notice palpitations and was seen in the emergency room where EKG showed no acute changes and labs were unremarkable. His blood pressure has been up as well. He admits to drinking a potof coffee a day which he has subsequently reduced to one cup per day.. There is no family history. He says he is prediabetic. His PCP recently decreased his hydrochlorothiazide from 25 mg daily to 12-1/2 mg daily and began him on Bystolic. He also describes symptoms compatible with obstructive sleep apnea. He continues to have palpitations. He did have an outpatient sleep study last week and we're awaiting that result. He continues to have palpitations whichfairly frequently which he attributes to stress. He did have a sleep study which was positive and was prescribedBiPAPby Dr. Claiborne Billings. Since I saw him 6 months ago he has clinically improved. He has more energy on BiPAP and his palpitation frequency has reduced.  He does give a fairly detailed log of his symptoms and blood pressures have been under good control.  His caffeine intake has declined.  His stress is at a manageable level.  He has a new child on the way which he is excited about.  His lipid profile is acceptable for primary prevention.  He  denies chest pain or shortness of breath.  Since I saw him in November of last year he had done well until yesterday when he had an episode of palpitations and headache lasting 20 minutes.  This was distinctly unusual for him since the last episode was 6 months ago.  He has been under more stress at work and at home mostly related to financial issues. Current Meds  Medication Sig  . BYSTOLIC 10 MG tablet TAKE 1 TABLET BY MOUTH DAILY.  . hydrochlorothiazide (HYDRODIURIL) 25 MG tablet TAKE 1 TABLET BY MOUTH DAILY.  [name] Kitchen polyethylene glycol (MIRALAX / GLYCOLAX) packet Take 17 g by mouth daily.     No Known Allergies  Social History   Socioeconomic History  . Marital status: Married    Spouse name: Not on file  . Number of children: 1  . Years of education: Not on file  . Highest education level: Not on file  Occupational History  . Occupation: Actuary  Social Needs  . Financial resource strain: Not on file  . Food insecurity:    Worry: Not on file    Inability: Not on file  . Transportation needs:    Medical: Not on file    Non-medical: Not on file  Tobacco Use  . Smoking status: Never Smoker  . Smokeless tobacco: Never Used  Substance and Sexual Activity  . Alcohol use: No  . Drug use: No  . Sexual activity: Never  Lifestyle  . Physical activity:    Days per week: Not on file  Minutes per session: Not on file  . Stress: Not on file  Relationships  . Social connections:    Talks on phone: Not on file    Gets together: Not on file    Attends religious service: Not on file    Active member of club or organization: Not on file    Attends meetings of clubs or organizations: Not on file    Relationship status: Not on file  . Intimate partner violence:    Fear of current or ex partner: Not on file    Emotionally abused: Not on file    Physically abused: Not on file    Forced sexual activity: Not on file  Other Topics Concern  . 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  Cardiovascular: negative for chest pain, dyspnea on exertion, edema, orthopnea, palpitations, paroxysmal nocturnal dyspnea or shortness of breath Dermatological: negative for rash Respiratory: negative for cough or wheezing Urologic: negative for hematuria Abdominal: negative for nausea, vomiting, diarrhea, bright red blood per rectum, melena, or hematemesis Neurologic: negative for visual changes, syncope, or dizziness All other systems reviewed and are otherwise negative except as noted above.    Blood pressure 128/78, pulse 66, height 6\' 2"  (1.88 m), weight 259 lb 12.8 oz (117.8 kg).  General appearance: alert and no distress Neck: no adenopathy, no carotid bruit, no JVD, supple, symmetrical, trachea midline and thyroid not enlarged, symmetric, no tenderness/mass/nodules Lungs: clear to auscultation bilaterally Heart: regular rate and rhythm, S1, S2 normal, no murmur, click, rub or gallop Extremities: extremities normal, atraumatic, no cyanosis or edema Pulses: 2+ and symmetric Skin: Skin color, texture, turgor normal. No rashes or lesions Neurologic: Alert and oriented X 3, normal strength and tone. Normal symmetric reflexes. Normal coordination and gait  EKG not performed today  ASSESSMENT AND PLAN:   Essential hypertension History of essential hypertension blood pressure measured today 128/78.  He is on Bystolic and HydroDIURIL.  Palpitations History of palpitations which now occur very infrequently.  The last occurred in September of last year.  He has decreased his caffeine and take from a pot of coffee a day to less than 1 cup a day.  I suspect the majority of symptoms are stress related.  Hyperlipidemia History of hyperlipidemia with lipid profile performed 07/08/2018 with total  cholesterol of 194, LDL 116 and HDL 47  Obstructive sleep apnea Obstructive sleep apnea on CPAP      Lorretta Harp MD [facility], [facility] 10/20/2018 4:13 PM

## 2018-10-20 NOTE — Assessment & Plan Note (Signed)
History of hyperlipidemia with lipid profile performed 07/08/2018 with total cholesterol of 194, LDL 116 and HDL 47

## 2018-10-20 NOTE — Assessment & Plan Note (Signed)
Obstructive sleep apnea on CPAP

## 2018-10-20 NOTE — Assessment & Plan Note (Signed)
History of palpitations which now occur very infrequently.  The last occurred in September of last year.  He has decreased his caffeine and take from a pot of coffee a day to less than 1 cup a day.  I suspect the majority of symptoms are stress related.

## 2018-10-20 NOTE — Assessment & Plan Note (Signed)
History of essential hypertension blood pressure measured today 128/78.  He is on Bystolic and HydroDIURIL.

## 2018-10-20 NOTE — Patient Instructions (Signed)

## 2018-10-31 NOTE — Telephone Encounter (Signed)
I definitely feel that his palpitations are stress induced

## 2018-11-01 MED FILL — HYDROCHLOROTHIAZIDE 25 MG T: 25 | 30 days supply | Qty: 30 | Fill #2 | Status: TO

## 2018-11-08 ENCOUNTER — Ambulatory Visit: Payer: No Typology Code available for payment source | Admitting: Podiatry

## 2018-11-08 ENCOUNTER — Other Ambulatory Visit: Payer: Self-pay

## 2018-11-08 ENCOUNTER — Encounter: Payer: Self-pay | Admitting: Podiatry

## 2018-11-08 VITALS — BP 138/68

## 2018-11-08 DIAGNOSIS — M79672 Pain in left foot: Secondary | ICD-10-CM

## 2018-11-08 DIAGNOSIS — M79674 Pain in right toe(s): Secondary | ICD-10-CM | POA: Diagnosis not present

## 2018-11-08 DIAGNOSIS — M79671 Pain in right foot: Secondary | ICD-10-CM

## 2018-11-08 DIAGNOSIS — L84 Corns and callosities: Secondary | ICD-10-CM | POA: Diagnosis not present

## 2018-11-08 DIAGNOSIS — M79675 Pain in left toe(s): Secondary | ICD-10-CM | POA: Diagnosis not present

## 2018-11-08 NOTE — Patient Instructions (Addendum)
Bunion  A bunion is a bump on the base of the big toe that forms when the bones of the big toe joint move out of position. Bunions may be small at first, but they often get larger over time. They can make walking painful. What are the causes? A bunion may be caused by:  Wearing narrow or pointed shoes that force the big toe to press against the other toes.  Abnormal foot development that causes the foot to roll inward (pronate).  Changes in the foot that are caused by certain diseases, such as rheumatoid arthritis or polio.  A foot injury. What increases the risk? The following factors may make you more likely to develop this condition:  Wearing shoes that squeeze the toes together.  Having certain diseases, such as: ? Rheumatoid arthritis. ? Polio. ? Cerebral palsy.  Having family members who have bunions.  Being born with a foot deformity, such as flat feet or low arches.  Doing activities that put a lot of pressure on the feet, such as ballet dancing. What are the signs or symptoms? The main symptom of a bunion is a noticeable bump on the big toe. Other symptoms may include:  Pain.  Swelling around the big toe.  Redness and inflammation.  Thick or hardened skin on the big toe or between the toes.  Stiffness or loss of motion in the big toe.  Trouble with walking. How is this diagnosed? A bunion may be diagnosed based on your symptoms, medical history, and activities. You may have tests, such as:  X-rays. These allow your health care provider to check the position of the bones in your foot and look for damage to your joint. They also help your health care provider determine the severity of your bunion and the best way to treat it.  Joint aspiration. In this test, a sample of fluid is removed from the toe joint. This test may be done if you are in a lot of pain. It helps rule out diseases that cause painful swelling of the joints, such as arthritis. How is this  treated? Treatment depends on the severity of your symptoms. The goal of treatment is to relieve symptoms and prevent the bunion from getting worse. Your health care provider may recommend:  Wearing shoes that have a wide toe box.  Using bunion pads to cushion the affected area.  Taping your toes together to keep them in a normal position.  Placing a device inside your shoe (orthotics) to help reduce pressure on your toe joint.  Taking medicine to ease pain, inflammation, and swelling.  Applying heat or ice to the affected area.  Doing stretching exercises.  Surgery to remove scar tissue and move the toes back into their normal position. This treatment is rare. Follow these instructions at home: Managing pain, stiffness, and swelling   If directed, put ice on the painful area: ? Put ice in a plastic bag. ? Place a towel between your skin and the bag. ? Leave the ice on for 20 minutes, 2-3 times a day. Activity   If directed, apply heat to the affected area before you exercise. Use the heat source that your health care provider recommends, such as a moist heat pack or a heating pad. ? Place a towel between your skin and the heat source. ? Leave the heat on for 20-30 minutes. ? Remove the heat if your skin turns bright red. This is especially important if you are unable to feel pain,   heat, or cold. You may have a greater risk of getting burned.  Do exercises as told by your health care provider. General instructions  Support your toe joint with proper footwear, shoe padding, or taping as told by your health care provider.  Take over-the-counter and prescription medicines only as told by your health care provider.  Keep all follow-up visits as told by your health care provider. This is important. Contact a health care provider if your symptoms:  Get worse.  Do not improve in 2 weeks. Get help right away if you have:  Severe pain and trouble with walking. Summary  A  bunion is a bump on the base of the big toe that forms when the bones of the big toe joint move out of position.  Bunions can make walking painful.  Treatment depends on the severity of your symptoms.  Support your toe joint with proper footwear, shoe padding, or taping as told by your health care provider. This information is not intended to replace advice given to you by your health care provider. Make sure you discuss any questions you have with your health care provider. Document Released: 08/03/2005 Document Revised: 12/14/2017 Document Reviewed: 12/14/2017 Elsevier Interactive Patient Education  2019 [place] are small areas of thickened skin that occur on the top, sides, or tip of a toe. They contain a cone-shaped core with a point that can press on a nerve below. This causes pain.  Calluses are areas of thickened skin that can occur anywhere on the body, including the hands, fingers, palms, soles of the feet, and heels. Calluses are usually larger than corns. What are the causes? Corns and calluses are caused by rubbing (friction) or pressure, such as from shoes that are too tight or do not fit properly. What increases the risk? Corns are more likely to develop in people who have misshapen toes (toe deformities), such as hammer toes. Calluses can occur with friction to any area of the skin. They are more likely to develop in people who:  Work with their hands.  Wear shoes that fit poorly, are too tight, or are high-heeled.  Have toe deformities. What are the signs or symptoms? Symptoms of a corn or callus include:  A hard growth on the skin.  Pain or tenderness under the skin.  Redness and swelling.  Increased discomfort while wearing tight-fitting shoes, if your feet are affected. If a corn or callus becomes infected, symptoms may include:  Redness and swelling that gets worse.  Pain.  Fluid, blood, or pus draining from the corn or  callus. How is this diagnosed? Corns and calluses may be diagnosed based on your symptoms, your medical history, and a physical exam. How is this treated? Treatment for corns and calluses may include:  Removing the cause of the friction or pressure. This may involve: ? Changing your shoes. ? Wearing shoe inserts (orthotics) or other protective layers in your shoes, such as a corn pad. ? Wearing gloves.  Applying medicine to the skin (topical medicine) to help soften skin in the hardened, thickened areas.  Removing layers of dead skin with a file to reduce the size of the corn or callus.  Removing the corn or callus with a scalpel or laser.  Taking antibiotic medicines, if your corn or callus is infected.  Having surgery, if a toe deformity is the cause. Follow these instructions at home:   Take over-the-counter and prescription medicines only as told by your  health care provider.  If you were prescribed an antibiotic, take it as told by your health care provider. Do not stop taking it even if your condition starts to improve.  Wear shoes that fit well. Avoid wearing high-heeled shoes and shoes that are too tight or too loose.  Wear any padding, protective layers, gloves, or orthotics as told by your health care provider.  Soak your hands or feet and then use a file or pumice stone to soften your corn or callus. Do this as told by your health care provider.  Check your corn or callus every day for symptoms of infection. Contact a health care provider if you:  Notice that your symptoms do not improve with treatment.  Have redness or swelling that gets worse.  Notice that your corn or callus becomes painful.  Have fluid, blood, or pus coming from your corn or callus.  Have new symptoms. Summary  Corns are small areas of thickened skin that occur on the top, sides, or tip of a toe.  Calluses are areas of thickened skin that can occur anywhere on the body, including the  hands, fingers, palms, and soles of the feet. Calluses are usually larger than corns.  Corns and calluses are caused by rubbing (friction) or pressure, such as from shoes that are too tight or do not fit properly.  Treatment may include wearing any padding, protective layers, gloves, or orthotics as told by your health care provider. This information is not intended to replace advice given to you by your health care provider. Make sure you discuss any questions you have with your health care provider. Document Released: 05/09/2004 Document Revised: 06/16/2017 Document Reviewed: 06/16/2017 Elsevier Interactive Patient Education  2019 [employer].

## 2018-11-10 NOTE — Telephone Encounter (Signed)
Error~ Encounter closed.

## 2018-11-14 ENCOUNTER — Encounter: Payer: Self-pay | Admitting: Podiatry

## 2018-11-14 NOTE — Progress Notes (Signed)
Subjective: Joel Marshall presents with cc of painful calluses b/l feet right greater than left foot. He has had this problem since his teenage years. He has tried trimming himself in the past. He did receive professional treatment in 2008 or 2009. Pain is aggravated when weightbearing with and without shoe gear.  Past Medical History:  Diagnosis Date  . Borderline diabetes   . Breast mass 10/15/2016   behind nipple; R  . History of intestine removal   . Panic attacks      Patient Active Problem List   Diagnosis Date Noted  . Hyperlipidemia 10/20/2018  . Pre-diabetes 05/05/2017  . Class 1 obesity due to excess calories with body mass index (BMI) of 33.0 to 33.9 in adult 11/09/2016  . Essential hypertension 08/19/2016  . Palpitations 08/19/2016  . Obstructive sleep apnea 08/19/2016     Past Surgical History:  Procedure Laterality Date  . [place]   "infected" intestine      Current Outpatient Medications:  .  BYSTOLIC 10 MG tablet, TAKE 1 TABLET BY MOUTH DAILY., Disp: 90 tablet, Rfl: 2 .  hydrochlorothiazide (HYDRODIURIL) 25 MG tablet, TAKE 1 TABLET BY MOUTH DAILY., Disp: 30 tablet, Rfl: 10 .  polyethylene glycol (MIRALAX / GLYCOLAX) packet, Take 17 g by mouth daily., Disp: , Rfl:    No Known Allergies   Social History   Occupational History  . Occupation: Actuary  Tobacco Use  . Smoking status: Never Smoker  . Smokeless tobacco: Never Used  Substance and Sexual Activity  . Alcohol use: No  . Drug use: No  . Sexual activity: Never     Family History  Problem Relation Age of Onset  . Rheum arthritis Mother   . Hypertension Father   . Breast cancer Unknown        cousin  . Colon cancer Unknown 39       cousin  . HIV Brother      Immunization History  Administered Date(s) Administered  . Influenza,inj,Quad PF,6+ Mos 05/05/2017, 07/08/2018  . Tdap 11/09/2016      Review of systems: Positive Findings in bold print.  Constitutional:   chills, fatigue, fever, sweats, weight change Communication: Optometrist, sign Ecologist, hand writing, iPad/Android device Head: headaches, head injury Eyes: changes in vision, eye pain, glaucoma, cataracts, macular degeneration, diplopia, glare,  light sensitivity, eyeglasses or contacts, blindness Ears nose mouth throat: hearing impaired, hearing aids,  ringing in ears, deaf, sign language,  vertigo,   nosebleeds,  rhinitis,  cold sores, snoring, swollen glands Cardiovascular: HTN, edema, arrhythmia, pacemaker in place, defibrillator in place, chest pain/tightness, chronic anticoagulation, blood clot, heart failure, MI Peripheral Vascular: leg cramps, varicose veins, blood clots, lymphedema, varicosities Respiratory:  difficulty breathing, denies congestion, SOB, wheezing, cough, emphysema Gastrointestinal: change in appetite or weight, abdominal pain, constipation, diarrhea, nausea, vomiting, vomiting blood, change in bowel habits, abdominal pain, jaundice, rectal bleeding, hemorrhoids, GERD Genitourinary:  nocturia,  pain on urination, polyuria,  blood in urine, Foley catheter, urinary urgency, ESRD on hemodialysis Musculoskeletal: amputation, cramping, stiff joints, painful joints, decreased joint motion, fractures, OA, gout, hemiplegia, paraplegia, uses cane, wheelchair bound, uses walker, uses rollator Skin: +changes in toenails, color change, dryness, itching, mole changes,  rash, wound(s) Neurological: headaches, numbness in feet, paresthesias in feet, burning in feet, fainting,  seizures, change in speech. denies headaches, memory problems/poor historian, cerebral palsy, weakness, paralysis, CVA, TIA Endocrine: diabetes, hypothyroidism, hyperthyroidism,  goiter, dry mouth, flushing, heat intolerance,  cold intolerance,  excessive thirst, denies polyuria,  nocturia Hematological:  easy bleeding, excessive bleeding, easy bruising, enlarged lymph nodes, on long term blood thinner,  history of past transusions Allergy/immunological:  hives, eczema, frequent infections, multiple drug allergies, seasonal allergies, transplant recipient, multiple food allergies Psychiatric:  anxiety, stress, panic attacks,  depression, mood disorder, suicidal ideations, hallucinations, insomnia  Objective: Vitals:   11/08/18 0820  BP: 138/68   Vascular Examination: Capillary refill time immediate x 10 digits  Dorsalis pedis and posterior tibial pulses present b/l  Digital hair present x 10 digits  Skin temperature warm to warm b/l  Dermatological Examination: Skin with normal turgor, texture and tone  Toenails 1-5 b/l well manicured with adequate length.  Hyperkeratotic lesions medial hallux b/l with tenderness to palpation of right foot lesion. Hyperkeratotic lesion submetatarsal head 5 b/l. No erythema, no edema, no drainage, no flocculence noted with any lesion.  Musculoskeletal: Muscle strength 5/5 to all LE muscle groups  HAV with bunion b/l.   Neurological: Sensation intact with 10 gram monofilament.  Vibratory sensation intact.  Assessment: Painful calluses b/l hallux and submetatarsal head 5 b/l.  Plan: 1. Discussed diagnoses and treatment options available. Patient agreed with paring of calluses b/l feet. 2. Patient to continue soft, supportive shoe gear daily. Calluses pared b/l hallux and submetatarsal head(s) 5 b/l utilizing sterile scalpel blade without incident. Dispensed silicone toe caps for daily comfort. 3. Patient to report any pedal injuries to medical professional immediately. 4. Follow up prn. 5. Patient/POA to call should there be a concern in the interim.

## 2018-11-26 MED FILL — HYDROCHLOROTHIAZIDE 25 MG T: 25 | 30 days supply | Qty: 30 | Fill #0

## 2018-11-26 MED FILL — BYSTOLIC 10 MG TABLET: 10 | 90 days supply | Qty: 90 | Fill #0

## 2018-12-20 MED FILL — HYDROCHLOROTHIAZIDE 25 MG T: 25 | 30 days supply | Qty: 30 | Fill #1

## 2019-01-20 MED FILL — HYDROCHLOROTHIAZIDE 25 MG T: 25 | 30 days supply | Qty: 30 | Fill #2

## 2019-02-17 MED FILL — HYDROCHLOROTHIAZIDE 25 MG T: 25 | 30 days supply | Qty: 30 | Fill #3

## 2019-02-20 MED FILL — BYSTOLIC 10 MG TABLET: 10 | 90 days supply | Qty: 90 | Fill #1

## 2019-03-19 MED FILL — HYDROCHLOROTHIAZIDE 25 MG T: 25 | 30 days supply | Qty: 30 | Fill #4

## 2019-03-20 ENCOUNTER — Telehealth: Payer: Self-pay

## 2019-03-20 NOTE — Telephone Encounter (Signed)

## 2019-03-21 ENCOUNTER — Encounter: Payer: Self-pay | Admitting: Plastic Surgery

## 2019-03-21 ENCOUNTER — Other Ambulatory Visit: Payer: Self-pay

## 2019-03-21 ENCOUNTER — Ambulatory Visit (INDEPENDENT_AMBULATORY_CARE_PROVIDER_SITE_OTHER): Payer: No Typology Code available for payment source | Admitting: Plastic Surgery

## 2019-03-21 ENCOUNTER — Institutional Professional Consult (permissible substitution): Payer: No Typology Code available for payment source | Admitting: Plastic Surgery

## 2019-03-21 DIAGNOSIS — H02829 Cysts of unspecified eye, unspecified eyelid: Secondary | ICD-10-CM | POA: Diagnosis not present

## 2019-03-21 NOTE — Progress Notes (Signed)
     Patient ID: Joel Marshall, male    DOB: May 03, 1981, 38 y.o.   MRN: [id number]   Chief Complaint  Patient presents with  . Skin Problem    The patient is a 38 year old black male here for evaluation of several skin lesions around his eyes.  The patient states for the last year he has noted these cysts.  He has borderline low right lower lateral eyelid and 2 on the left lower lateral eyelid.  H most lateral cyst had been drained by the dermatologist.  And they have come back.  He is very concerned about the large one on the left lower lid.  He is otherwise not had any skin lesions before.  He has hypertension but is stable with medical treatment.  He is quite apprehensive and would most likely appreciate this being done with general anesthesia.   Review of Systems  Constitutional: Negative for activity change and appetite change.  HENT: Negative for dental problem.   Eyes: Negative.   Respiratory: Negative.  Negative for shortness of breath.   Cardiovascular: Negative.  Negative for leg swelling.  Gastrointestinal: Negative for abdominal pain.  Genitourinary: Negative.   Musculoskeletal: Negative.   Skin: Negative for color change and wound.  Psychiatric/Behavioral: Negative.     Past Medical History:  Diagnosis Date  . Borderline diabetes   . Breast mass 10/15/2016   behind nipple; R  . History of intestine removal   . Panic attacks     Past Surgical History:  Procedure Laterality Date  . [place]   "infected" intestine      Current Outpatient Medications:  .  BYSTOLIC 10 MG tablet, TAKE 1 TABLET BY MOUTH DAILY., Disp: 90 tablet, Rfl: 2 .  hydrochlorothiazide (HYDRODIURIL) 25 MG tablet, TAKE 1 TABLET BY MOUTH DAILY., Disp: 30 tablet, Rfl: 10 .  polyethylene glycol (MIRALAX / GLYCOLAX) packet, Take 17 g by mouth daily., Disp: , Rfl:    Objective:   Vitals:   03/21/19 1316  BP: 138/70  Pulse: 77  Temp: 98.8 F (37.1 C)  SpO2: 97%    Physical  Exam Vitals signs and nursing note reviewed.  Constitutional:      Appearance: Normal appearance.  HENT:     Head: Normocephalic.  Eyes:   Cardiovascular:     Rate and Rhythm: Normal rate.  Pulmonary:     Effort: Pulmonary effort is normal.  Skin:    General: Skin is warm.  Neurological:     General: No focal deficit present.     Mental Status: He is alert.  Psychiatric:        Mood and Affect: Mood normal.        Behavior: Behavior normal.        Thought Content: Thought content normal.     Assessment & Plan:     ICD-10-CM   1. Cyst of eyelid, unspecified laterality  H02.829       Recommend excision of bilateral lower lid cysts.  Pictures were obtained of the patient and placed in the chart with the patient's or guardian's permission.   [place], DO

## 2019-04-18 MED FILL — HYDROCHLOROTHIAZIDE 25 MG T: 25 | 30 days supply | Qty: 30 | Fill #5

## 2019-05-19 ENCOUNTER — Other Ambulatory Visit: Payer: Self-pay | Admitting: Cardiovascular Disease

## 2019-05-19 MED FILL — HYDROCHLOROTHIAZIDE 25 MG T: 25 | 30 days supply | Qty: 30 | Fill #6

## 2019-05-22 MED FILL — BYSTOLIC 10 MG TABLET: 10 | 90 days supply | Qty: 90 | Fill #0

## 2019-06-19 MED FILL — HYDROCHLOROTHIAZIDE 25 MG T: 25 | 30 days supply | Qty: 30 | Fill #7

## 2019-07-18 ENCOUNTER — Other Ambulatory Visit: Payer: Self-pay | Admitting: Cardiovascular Disease

## 2019-07-18 MED FILL — HYDROCHLOROTHIAZIDE 25 MG T: 25 | 90 days supply | Qty: 90 | Fill #0

## 2019-08-29 ENCOUNTER — Ambulatory Visit (INDEPENDENT_AMBULATORY_CARE_PROVIDER_SITE_OTHER): Payer: No Typology Code available for payment source | Admitting: Plastic Surgery

## 2019-08-29 ENCOUNTER — Encounter: Payer: Self-pay | Admitting: Plastic Surgery

## 2019-08-29 ENCOUNTER — Other Ambulatory Visit: Payer: Self-pay

## 2019-08-29 VITALS — BP 132/82 | HR 74 | Temp 97.5°F | Ht 74.0 in | Wt 261.4 lb

## 2019-08-29 DIAGNOSIS — H02829 Cysts of unspecified eye, unspecified eyelid: Secondary | ICD-10-CM

## 2019-08-29 MED ORDER — ERYTHROMYCIN 5 MG/GM OP OINT: 1.0000 "application " | TOPICAL_OINTMENT | Freq: Every day | OPHTHALMIC | 0 refills | Status: DC

## 2019-08-29 MED ORDER — ONDANSETRON HCL 4 MG PO TABS: 4.0000 mg | ORAL_TABLET | Freq: Three times a day (TID) | ORAL | 0 refills | Status: AC | PRN

## 2019-08-29 MED ORDER — HYDROCODONE-ACETAMINOPHEN 5-325 MG PO TABS: 1.0000 | ORAL_TABLET | Freq: Three times a day (TID) | ORAL | 0 refills | Status: AC | PRN

## 2019-08-29 MED FILL — ERYTHROMYCIN EYE OINTMENT: 5 | 20 days supply | Qty: 4 | Fill #0

## 2019-08-29 MED FILL — HYDROCODON-APAP 5-325: 5-325 | 3 days supply | Qty: 9 | Fill #0

## 2019-08-29 MED FILL — ONDANSETRON HCL 4 MG TABLET: 4 | 2 days supply | Qty: 6 | Fill #0

## 2019-08-29 NOTE — H&P (View-Only) (Signed)
Patient ID: Joel Marshall, male    DOB: Dec 31, 1980, 39 y.o.   MRN: [id number]   Chief Complaint  Patient presents with  . Pre-op Exam    excision of BL lower eye lid cyst    The patient is a 39 year old black male here for his preoperative history and physical for treatment of eyelid cysts.  He has had these for the past year.  They are mostly on the lateral aspect of both lower lids.  One is been drained by the dermatologist but came back.  He is concerned about the increase in size.  He does not have a history of cancer and is compliant with his hypertension treatment otherwise has no concerning medical issues.  He has gotten an interview for a new job and is hopeful to receive the position.  He would like to see if he can have the surgery done before the new job starts.   Review of Systems  Constitutional: Negative.   HENT: Negative.   Eyes: Negative.   Respiratory: Negative.   Cardiovascular: Negative.   Gastrointestinal: Negative.   Genitourinary: Negative.   Musculoskeletal: Negative.   Skin: Positive for color change.  Psychiatric/Behavioral: Negative.     Past Medical History:  Diagnosis Date  . Borderline diabetes   . Breast mass 10/15/2016   behind nipple; R  . History of intestine removal   . Panic attacks     Past Surgical History:  Procedure Laterality Date  . [place]   "infected" intestine      Current Outpatient Medications:  .  BYSTOLIC 10 MG tablet, TAKE 1 TABLET BY MOUTH DAILY., Disp: 90 tablet, Rfl: 1 .  hydrochlorothiazide (HYDRODIURIL) 25 MG tablet, TAKE 1 TABLET BY MOUTH DAILY., Disp: 30 tablet, Rfl: 7 .  polyethylene glycol (MIRALAX / GLYCOLAX) packet, Take 17 g by mouth daily., Disp: , Rfl:    Objective:   Vitals:   08/29/19 1528  BP: 132/82  Pulse: 74  Temp: (!) 97.5 F (36.4 C)  SpO2: 96%    Physical Exam Vitals and nursing note reviewed.  Constitutional:      Appearance: Normal appearance.  HENT:     Head:  Normocephalic and atraumatic.  Eyes:     Extraocular Movements: Extraocular movements intact.   Cardiovascular:     Rate and Rhythm: Normal rate.     Pulses: Normal pulses.  Pulmonary:     Effort: Pulmonary effort is normal. No respiratory distress.  Abdominal:     General: Abdomen is flat. There is no distension.  Neurological:     General: No focal deficit present.     Mental Status: He is oriented to person, place, and time.  Psychiatric:        Mood and Affect: Mood normal.        Behavior: Behavior normal.        Thought Content: Thought content normal.     Assessment & Plan:  Cyst of eyelid, unspecified laterality  Plan on excision of bilateral lower eyelid cysts.  We talked about the risks and the complications.  The risks that can be encountered with and after excision of a skin lesion were discussed and include the following but not limited to these: bleeding, infection, delayed healing, anesthesia risks, skin sensation changes, injury to structures including nerves, blood vessels, and muscles which may be temporary or permanent, allergies to tape, suture materials and glues, blood products, topical preparations or injected agents, skin contour  irregularities, skin discoloration and swelling, deep vein thrombosis, cardiac and pulmonary complications, pain, which may persist, persistent pain, recurrence of the lesion, poor healing of the incision, possible need for revisional surgery or staged procedures.    Prescription sent to pharmacy.  The [place] was signed into law in 2016 which includes the topic of electronic health records.  This provides immediate access to information in MyChart.  This includes consultation notes, operative notes, office notes, lab results and pathology reports.  If you have any questions about what you read please let us know at your next visit or call us at the office.  We are right here with you.   Shannon, DO

## 2019-08-29 NOTE — Progress Notes (Signed)
Patient ID: Joel Marshall, male    DOB: Feb 01, 1981, 39 y.o.   MRN: [id number]   Chief Complaint  Patient presents with  . Pre-op Exam    excision of BL lower eye lid cyst    The patient is a 39 year old black male here for his preoperative history and physical for treatment of eyelid cysts.  He has had these for the past year.  They are mostly on the lateral aspect of both lower lids.  One is been drained by the dermatologist but came back.  He is concerned about the increase in size.  He does not have a history of cancer and is compliant with his hypertension treatment otherwise has no concerning medical issues.  He has gotten an interview for a new job and is hopeful to receive the position.  He would like to see if he can have the surgery done before the new job starts.   Review of Systems  Constitutional: Negative.   HENT: Negative.   Eyes: Negative.   Respiratory: Negative.   Cardiovascular: Negative.   Gastrointestinal: Negative.   Genitourinary: Negative.   Musculoskeletal: Negative.   Skin: Positive for color change.  Psychiatric/Behavioral: Negative.     Past Medical History:  Diagnosis Date  . Borderline diabetes   . Breast mass 10/15/2016   behind nipple; R  . History of intestine removal   . Panic attacks     Past Surgical History:  Procedure Laterality Date  . [place]   "infected" intestine      Current Outpatient Medications:  .  BYSTOLIC 10 MG tablet, TAKE 1 TABLET BY MOUTH DAILY., Disp: 90 tablet, Rfl: 1 .  hydrochlorothiazide (HYDRODIURIL) 25 MG tablet, TAKE 1 TABLET BY MOUTH DAILY., Disp: 30 tablet, Rfl: 7 .  polyethylene glycol (MIRALAX / GLYCOLAX) packet, Take 17 g by mouth daily., Disp: , Rfl:    Objective:   Vitals:   08/29/19 1528  BP: 132/82  Pulse: 74  Temp: (!) 97.5 F (36.4 C)  SpO2: 96%    Physical Exam Vitals and nursing note reviewed.  Constitutional:      Appearance: Normal appearance.  HENT:     Head:  Normocephalic and atraumatic.  Eyes:     Extraocular Movements: Extraocular movements intact.   Cardiovascular:     Rate and Rhythm: Normal rate.     Pulses: Normal pulses.  Pulmonary:     Effort: Pulmonary effort is normal. No respiratory distress.  Abdominal:     General: Abdomen is flat. There is no distension.  Neurological:     General: No focal deficit present.     Mental Status: He is oriented to person, place, and time.  Psychiatric:        Mood and Affect: Mood normal.        Behavior: Behavior normal.        Thought Content: Thought content normal.     Assessment & Plan:  Cyst of eyelid, unspecified laterality  Plan on excision of bilateral lower eyelid cysts.  We talked about the risks and the complications.  The risks that can be encountered with and after excision of a skin lesion were discussed and include the following but not limited to these: bleeding, infection, delayed healing, anesthesia risks, skin sensation changes, injury to structures including nerves, blood vessels, and muscles which may be temporary or permanent, allergies to tape, suture materials and glues, blood products, topical preparations or injected agents, skin contour  irregularities, skin discoloration and swelling, deep vein thrombosis, cardiac and pulmonary complications, pain, which may persist, persistent pain, recurrence of the lesion, poor healing of the incision, possible need for revisional surgery or staged procedures.    Prescription sent to pharmacy.  The [place] was signed into law in 2016 which includes the topic of electronic health records.  This provides immediate access to information in MyChart.  This includes consultation notes, operative notes, office notes, lab results and pathology reports.  If you have any questions about what you read please let us know at your next visit or call us at the office.  We are right here with you.   [place], DO

## 2019-09-01 ENCOUNTER — Other Ambulatory Visit: Payer: Self-pay

## 2019-09-01 ENCOUNTER — Encounter (HOSPITAL_BASED_OUTPATIENT_CLINIC_OR_DEPARTMENT_OTHER): Payer: Self-pay | Admitting: Plastic Surgery

## 2019-09-01 MED FILL — BYSTOLIC 10 MG TABLET: 10 | 90 days supply | Qty: 90 | Fill #1

## 2019-09-04 ENCOUNTER — Encounter (HOSPITAL_BASED_OUTPATIENT_CLINIC_OR_DEPARTMENT_OTHER)
Admission: RE | Admit: 2019-09-04 | Discharge: 2019-09-04 | Disposition: A | Discharge status: Home / Self Care | Payer: No Typology Code available for payment source | Source: Ambulatory Visit | Attending: Plastic Surgery | Admitting: Plastic Surgery

## 2019-09-04 ENCOUNTER — Encounter: Payer: Self-pay | Admitting: Physician Assistant

## 2019-09-04 ENCOUNTER — Other Ambulatory Visit (HOSPITAL_COMMUNITY)
Admission: RE | Admit: 2019-09-04 | Discharge: 2019-09-04 | Disposition: A | Discharge status: Home / Self Care | Payer: No Typology Code available for payment source | Source: Ambulatory Visit | Attending: Plastic Surgery | Admitting: Plastic Surgery

## 2019-09-04 ENCOUNTER — Other Ambulatory Visit: Payer: Self-pay

## 2019-09-04 DIAGNOSIS — I10 Essential (primary) hypertension: Secondary | ICD-10-CM | POA: Insufficient documentation

## 2019-09-04 DIAGNOSIS — Z01818 Encounter for other preprocedural examination: Secondary | ICD-10-CM | POA: Insufficient documentation

## 2019-09-04 DIAGNOSIS — Z01812 Encounter for preprocedural laboratory examination: Secondary | ICD-10-CM | POA: Insufficient documentation

## 2019-09-04 DIAGNOSIS — Z20822 Contact with and (suspected) exposure to covid-19: Secondary | ICD-10-CM | POA: Insufficient documentation

## 2019-09-04 LAB — BASIC METABOLIC PANEL
Anion gap: 9 (ref 5–15)
BUN: 11 mg/dL (ref 6–20)
CO2: 31 mmol/L (ref 22–32)
Calcium: 9.5 mg/dL (ref 8.9–10.3)
Chloride: 100 mmol/L (ref 98–111)
Creatinine, Ser: 1.27 mg/dL — ABNORMAL HIGH (ref 0.61–1.24)
GFR calc Af Amer: >60 mL/min (ref >60)
GFR calc non Af Amer: >60 mL/min (ref >60)
Glucose, Bld: 80 mg/dL (ref 70–99)
Potassium: 3.7 mmol/L (ref 3.5–5.1)
Sodium: 140 mmol/L (ref 135–145)

## 2019-09-06 LAB — NOVEL CORONAVIRUS, NAA (HOSP ORDER, SEND-OUT TO REF LAB; TAT 18-24 HRS): SARS-CoV-2, NAA: NOT DETECTED

## 2019-09-07 ENCOUNTER — Encounter (HOSPITAL_BASED_OUTPATIENT_CLINIC_OR_DEPARTMENT_OTHER)
Admission: RE | Disposition: A | Discharge status: Home / Self Care | Payer: Self-pay | Source: Home / Self Care | Attending: Plastic Surgery

## 2019-09-07 ENCOUNTER — Encounter: Payer: Self-pay | Admitting: Plastic Surgery

## 2019-09-07 ENCOUNTER — Encounter (HOSPITAL_BASED_OUTPATIENT_CLINIC_OR_DEPARTMENT_OTHER): Payer: Self-pay | Admitting: Plastic Surgery

## 2019-09-07 ENCOUNTER — Ambulatory Visit (HOSPITAL_BASED_OUTPATIENT_CLINIC_OR_DEPARTMENT_OTHER): Payer: No Typology Code available for payment source | Admitting: Anesthesiology

## 2019-09-07 ENCOUNTER — Ambulatory Visit (HOSPITAL_BASED_OUTPATIENT_CLINIC_OR_DEPARTMENT_OTHER)
Admission: RE | Admit: 2019-09-07 | Discharge: 2019-09-07 | Disposition: A | Discharge status: Home / Self Care | Payer: No Typology Code available for payment source | Attending: Plastic Surgery | Admitting: Plastic Surgery

## 2019-09-07 DIAGNOSIS — Z79899 Other long term (current) drug therapy: Secondary | ICD-10-CM | POA: Insufficient documentation

## 2019-09-07 DIAGNOSIS — D23122 Other benign neoplasm of skin of left lower eyelid, including canthus: Secondary | ICD-10-CM | POA: Diagnosis not present

## 2019-09-07 DIAGNOSIS — I10 Essential (primary) hypertension: Secondary | ICD-10-CM | POA: Diagnosis not present

## 2019-09-07 DIAGNOSIS — H02822 Cysts of right lower eyelid: Secondary | ICD-10-CM | POA: Insufficient documentation

## 2019-09-07 DIAGNOSIS — G4730 Sleep apnea, unspecified: Secondary | ICD-10-CM | POA: Diagnosis not present

## 2019-09-07 DIAGNOSIS — H02825 Cysts of left lower eyelid: Secondary | ICD-10-CM

## 2019-09-07 HISTORY — DX: Sleep apnea, unspecified: G47.30

## 2019-09-07 HISTORY — PX: CYST EXCISION: SHX5701

## 2019-09-07 SURGERY — CYST REMOVAL
Anesthesia: General | Site: Eye | Laterality: Bilateral

## 2019-09-07 MED ORDER — SODIUM CHLORIDE 0.9% FLUSH: 3.0000 mL | INTRAVENOUS | Status: DC | PRN

## 2019-09-07 MED ORDER — OXYCODONE HCL 5 MG PO TABS: 5.0000 mg | ORAL_TABLET | Freq: Once | ORAL | Status: DC | PRN

## 2019-09-07 MED ORDER — DEXAMETHASONE SODIUM PHOSPHATE 4 MG/ML IJ SOLN
INTRAMUSCULAR | Status: DC | PRN
  Administered 2019-09-07: 5 mg via INTRAVENOUS

## 2019-09-07 MED ORDER — PROPOFOL 10 MG/ML IV BOLUS
INTRAVENOUS | Status: DC | PRN
  Administered 2019-09-07: 300 mg via INTRAVENOUS

## 2019-09-07 MED ORDER — DEXAMETHASONE SODIUM PHOSPHATE 10 MG/ML IJ SOLN
INTRAMUSCULAR | Status: AC
  Filled 2019-09-07: qty 1

## 2019-09-07 MED ORDER — CEFAZOLIN SODIUM-DEXTROSE 2-4 GM/100ML-% IV SOLN
INTRAVENOUS | Status: AC
  Filled 2019-09-07: qty 100

## 2019-09-07 MED ORDER — OXYCODONE HCL 5 MG PO TABS: 5.0000 mg | ORAL_TABLET | ORAL | Status: DC | PRN

## 2019-09-07 MED ORDER — OXYCODONE HCL 5 MG/5ML PO SOLN: 5.0000 mg | Freq: Once | ORAL | Status: DC | PRN

## 2019-09-07 MED ORDER — SODIUM CHLORIDE 0.9 % IV SOLN: 250.0000 mL | INTRAVENOUS | Status: DC | PRN

## 2019-09-07 MED ORDER — LACTATED RINGERS IV SOLN: INTRAVENOUS | Status: DC | PRN

## 2019-09-07 MED ORDER — PHENYLEPHRINE 40 MCG/ML (10ML) SYRINGE FOR IV PUSH (FOR BLOOD PRESSURE SUPPORT)
PREFILLED_SYRINGE | INTRAVENOUS | Status: AC
  Filled 2019-09-07: qty 10

## 2019-09-07 MED ORDER — LIDOCAINE HCL (CARDIAC) PF 100 MG/5ML IV SOSY
PREFILLED_SYRINGE | INTRAVENOUS | Status: DC | PRN
  Administered 2019-09-07: 100 mg via INTRAVENOUS

## 2019-09-07 MED ORDER — ACETAMINOPHEN 500 MG PO TABS: 1000.0000 mg | ORAL_TABLET | Freq: Once | ORAL | Status: DC

## 2019-09-07 MED ORDER — ONDANSETRON HCL 4 MG/2ML IJ SOLN
INTRAMUSCULAR | Status: AC
  Filled 2019-09-07: qty 2

## 2019-09-07 MED ORDER — LIDOCAINE 2% (20 MG/ML) 5 ML SYRINGE
INTRAMUSCULAR | Status: AC
  Filled 2019-09-07: qty 5

## 2019-09-07 MED ORDER — ACETAMINOPHEN 650 MG RE SUPP: 650.0000 mg | RECTAL | Status: DC | PRN

## 2019-09-07 MED ORDER — LIDOCAINE-EPINEPHRINE 1 %-1:100000 IJ SOLN
INTRAMUSCULAR | Status: DC | PRN
  Administered 2019-09-07: 1 mL

## 2019-09-07 MED ORDER — PROMETHAZINE HCL 25 MG/ML IJ SOLN: 6.2500 mg | INTRAMUSCULAR | Status: DC | PRN

## 2019-09-07 MED ORDER — FENTANYL CITRATE (PF) 100 MCG/2ML IJ SOLN
INTRAMUSCULAR | Status: AC
  Filled 2019-09-07: qty 2

## 2019-09-07 MED ORDER — MIDAZOLAM HCL 2 MG/2ML IJ SOLN
INTRAMUSCULAR | Status: AC
  Filled 2019-09-07: qty 2

## 2019-09-07 MED ORDER — FENTANYL CITRATE (PF) 100 MCG/2ML IJ SOLN
INTRAMUSCULAR | Status: DC | PRN
  Administered 2019-09-07: 100 ug via INTRAVENOUS

## 2019-09-07 MED ORDER — FENTANYL CITRATE (PF) 100 MCG/2ML IJ SOLN: 25.0000 ug | INTRAMUSCULAR | Status: DC | PRN

## 2019-09-07 MED ORDER — SODIUM CHLORIDE 0.9% FLUSH: 3.0000 mL | Freq: Two times a day (BID) | INTRAVENOUS | Status: DC

## 2019-09-07 MED ORDER — CEFAZOLIN SODIUM-DEXTROSE 2-3 GM-%(50ML) IV SOLR
INTRAVENOUS | Status: DC | PRN
  Administered 2019-09-07: 2 g via INTRAVENOUS

## 2019-09-07 MED ORDER — ACETAMINOPHEN 325 MG PO TABS: 650.0000 mg | ORAL_TABLET | ORAL | Status: DC | PRN

## 2019-09-07 MED ORDER — MIDAZOLAM HCL 5 MG/5ML IJ SOLN
INTRAMUSCULAR | Status: DC | PRN
  Administered 2019-09-07: 2 mg via INTRAVENOUS

## 2019-09-07 MED ORDER — EPHEDRINE 5 MG/ML INJ
INTRAVENOUS | Status: AC
  Filled 2019-09-07: qty 10

## 2019-09-07 MED ORDER — PROPOFOL 500 MG/50ML IV EMUL
INTRAVENOUS | Status: AC
  Filled 2019-09-07: qty 50

## 2019-09-07 MED ORDER — ONDANSETRON HCL 4 MG/2ML IJ SOLN
INTRAMUSCULAR | Status: DC | PRN
  Administered 2019-09-07: 4 mg via INTRAVENOUS

## 2019-09-07 MED ORDER — SUCCINYLCHOLINE CHLORIDE 200 MG/10ML IV SOSY
PREFILLED_SYRINGE | INTRAVENOUS | Status: AC
  Filled 2019-09-07: qty 10

## 2019-09-07 SURGICAL SUPPLY — 53 items
BAND RUBBER #18 3X1/16 STRL (MISCELLANEOUS) IMPLANT
BLADE CLIPPER SURG (BLADE) IMPLANT
BLADE SURG 15 STRL LF DISP TIS (BLADE) ×1 IMPLANT
BLADE SURG 15 STRL SS (BLADE) ×2
CANISTER SUCT 1200ML W/VALVE (MISCELLANEOUS) IMPLANT
CLOSURE WOUND 1/2 X4 (GAUZE/BANDAGES/DRESSINGS)
CORD BIPOLAR FORCEPS 12FT (ELECTRODE) IMPLANT
COVER BACK TABLE 60X90IN (DRAPES) ×3 IMPLANT
COVER MAYO STAND STRL (DRAPES) ×3 IMPLANT
COVER WAND RF STERILE (DRAPES) IMPLANT
DERMABOND ADVANCED (GAUZE/BANDAGES/DRESSINGS)
DERMABOND ADVANCED .7 DNX12 (GAUZE/BANDAGES/DRESSINGS) IMPLANT
DRAPE HALF SHEET 70X43 (DRAPES) IMPLANT
DRAPE U-SHAPE 76X120 STRL (DRAPES) ×3 IMPLANT
DRSG TEGADERM 2-3/8X2-3/4 SM (GAUZE/BANDAGES/DRESSINGS) IMPLANT
ELECT COATED BLADE 2.86 ST (ELECTRODE) IMPLANT
ELECT NEEDLE BLADE 2-5/6 (NEEDLE) ×3 IMPLANT
ELECT REM PT RETURN 9FT ADLT (ELECTROSURGICAL) ×3
ELECT REM PT RETURN 9FT PED (ELECTROSURGICAL)
ELECTRODE REM PT RETRN 9FT PED (ELECTROSURGICAL) IMPLANT
ELECTRODE REM PT RTRN 9FT ADLT (ELECTROSURGICAL) ×1 IMPLANT
GAUZE SPONGE 4X4 12PLY STRL LF (GAUZE/BANDAGES/DRESSINGS) IMPLANT
GLOVE BIO SURGEON STRL SZ 6.5 (GLOVE) ×8 IMPLANT
GLOVE BIO SURGEONS STRL SZ 6.5 (GLOVE) ×4
GOWN STRL REUS W/ TWL LRG LVL3 (GOWN DISPOSABLE) ×4 IMPLANT
GOWN STRL REUS W/TWL LRG LVL3 (GOWN DISPOSABLE) ×8
NEEDLE HYPO 30GX1 BEV (NEEDLE) ×3 IMPLANT
NEEDLE PRECISIONGLIDE 27X1.5 (NEEDLE) IMPLANT
NS IRRIG 1000ML POUR BTL (IV SOLUTION) IMPLANT
PACK BASIN DAY SURGERY FS (CUSTOM PROCEDURE TRAY) ×3 IMPLANT
PENCIL SMOKE EVACUATOR (MISCELLANEOUS) ×3 IMPLANT
SPONGE GAUZE 2X2 8PLY STER LF (GAUZE/BANDAGES/DRESSINGS)
SPONGE GAUZE 2X2 8PLY STRL LF (GAUZE/BANDAGES/DRESSINGS) IMPLANT
STRIP CLOSURE SKIN 1/2X4 (GAUZE/BANDAGES/DRESSINGS) IMPLANT
STRIP SUTURE WOUND CLOSURE 1/2 (MISCELLANEOUS) IMPLANT
SUCTION FRAZIER HANDLE 10FR (MISCELLANEOUS)
SUCTION TUBE FRAZIER 10FR DISP (MISCELLANEOUS) IMPLANT
SUT ETHILON 5 0 P 3 18 (SUTURE)
SUT MNCRL 6-0 UNDY P1 1X18 (SUTURE) ×1 IMPLANT
SUT MNCRL AB 4-0 PS2 18 (SUTURE) IMPLANT
SUT MON AB 5-0 P3 18 (SUTURE) IMPLANT
SUT MONOCRYL 6-0 P1 1X18 (SUTURE) ×2
SUT NYLON ETHILON 5-0 P-3 1X18 (SUTURE) IMPLANT
SUT PLAIN 5 0 P 3 18 (SUTURE) ×3 IMPLANT
SUT VIC AB 5-0 P-3 18X BRD (SUTURE) IMPLANT
SUT VIC AB 5-0 P3 18 (SUTURE)
SUT VICRYL 4-0 PS2 18IN ABS (SUTURE) IMPLANT
SYR BULB 3OZ (MISCELLANEOUS) IMPLANT
SYR CONTROL 10ML LL (SYRINGE) ×3 IMPLANT
TOWEL GREEN STERILE FF (TOWEL DISPOSABLE) ×3 IMPLANT
TRAY DSU PREP LF (CUSTOM PROCEDURE TRAY) ×3 IMPLANT
TUBE CONNECTING 20'X1/4 (TUBING)
TUBE CONNECTING 20X1/4 (TUBING) IMPLANT

## 2019-09-07 NOTE — Interval H&P Note (Signed)
History and Physical Interval Note:  09/07/2019 12:45 PM  Joel Marshall  has presented today for surgery, with the diagnosis of bilateral eye lid cysts.  The various methods of treatment have been discussed with the patient and family. After consideration of risks, benefits and other options for treatment, the patient has consented to  Procedure(s): Excision of bilateral lower eye lid cysts (Bilateral) as a surgical intervention.  The patient's history has been reviewed, patient examined, no change in status, stable for surgery.  I have reviewed the patient's chart and labs.  Questions were answered to the patient's satisfaction.     Loel Lofty Franklin Baumbach

## 2019-09-07 NOTE — Interval H&P Note (Signed)
History and Physical Interval Note:  09/07/2019 11:07 AM  Joel Marshall  has presented today for surgery, with the diagnosis of bilateral eye lid cysts.  The various methods of treatment have been discussed with the patient and family. After consideration of risks, benefits and other options for treatment, the patient has consented to  Procedure(s): Excision of bilateral lower eye lid cysts (Bilateral) as a surgical intervention.  The patient's history has been reviewed, patient examined, no change in status, stable for surgery.  I have reviewed the patient's chart and labs.  Questions were answered to the patient's satisfaction.     [name]

## 2019-09-07 NOTE — Discharge Instructions (Addendum)
INSTRUCTIONS FOR AFTER SURGERY   You will likely have some questions about what to expect following your operation.  The following information will help you and your family understand what to expect when you are discharged from the hospital.  Following these guidelines will help ensure a smooth recovery and reduce risks of complications.  Postoperative instructions include information on: diet, wound care, medications and physical activity.  AFTER SURGERY Expect to go home after the procedure.  In some cases, you may need to spend one night in the hospital for observation.  DIET This surgery does not require a specific diet.  However, I have to mention that the healthier you eat the better your body can start healing. It is important to increasing your protein intake.  This means limiting the foods with added sugar.  Focus on fruits and vegetables and some meat.  If you have any liposuction during your procedure be sure to drink water.  If your urine is bright yellow, then it is concentrated, and you need to drink more water.  As a general rule after surgery, you should have 8 ounces of water every hour while awake.  If you find you are persistently nauseated or unable to take in liquids let us know.  NO TOBACCO USE or EXPOSURE.  This will slow your healing process and increase the risk of a wound.  WOUND CARE You can shower the day after surgery.  Use fragrance free soap.  Dial, Dove, Ivory and Cetaphil are usually mild on the skin.  If you have steri-strips / tape directly attached to your skin leave them in place. It is OK to get these wet.  No baths, pools or hot tubs for two weeks. We close your incision to leave the smallest and best-looking scar. No ointment or creams on your incisions until given the go ahead.  Especially not Neosporin (Too many skin reactions with this one).  A few weeks after surgery you can use Mederma and start massaging the scar.  ACTIVITY No heavy lifting until cleared  by the doctor.  It is OK to walk and climb stairs. In fact, moving your legs is very important to decrease your risk of a blood clot.  It will also help keep you from getting deconditioned.  Every 1 to 2 hours get up and walk for 5 minutes. This will help with a quicker recovery back to normal.  Let pain be your guide so you don't do too much.  NO, you cannot do the spring cleaning and don't plan on taking care of anyone else.  This is your time for TLC.   WORK Everyone returns to work at different times. As a rough guide, most people take at least 1 - 2 weeks off prior to returning to work. If you need documentation for your job, bring the forms to your postoperative follow up visit.  DRIVING Arrange for someone to bring you home from the hospital.  You may be able to drive a few days after surgery but not while taking any narcotics or valium.  BOWEL MOVEMENTS Constipation can occur after anesthesia and while taking pain medication.  It is important to stay ahead for your comfort.  We recommend taking Milk of Magnesia (2 tablespoons; twice a day) while taking the pain pills.  SEROMA This is fluid your body tried to put in the surgical site.  This is normal but if it creates excessive pain and swelling let us know.  It usually decreases in   a few weeks.  MEDICATIONS and PAIN CONTROL At your preoperative visit for you history and physical you were given the following medications: 1. An antibiotic: Start this medication when you get home and take according to the instructions on the bottle. 2. Zofran 4 mg:  This is to treat nausea and vomiting.  You can take this every 6 hours as needed and only if needed. 3. Norco (hydrocodone/acetaminophen) 5/325 mg:  This is only to be used after you have taken the motrin or the tylenol. Every 8 hours as needed. Over the counter Medication to take: 4. Ibuprofen (Motrin) 600 mg:  Take this every 6 hours.  If you have additional pain then take 500 mg of the  tylenol.  Only take the Norco after you have tried these two. 5. Miralax or stool softener of choice: Take this according to the bottle if you take the Norco.  WHEN TO CALL Call your surgeon's office if any of the following occur: . Fever 101 degrees F or greater . Excessive bleeding or fluid from the incision site. . Pain that increases over time without aid from the medications . Redness, warmth, or pus draining from incision sites . Persistent nausea or inability to take in liquids . Severe misshapen area that underwent the operation.   Post Anesthesia Home Care Instructions  Activity: Get plenty of rest for the remainder of the day. A responsible individual must stay with you for 24 hours following the procedure.  For the next 24 hours, DO NOT: -Drive a car -Operate machinery -Drink alcoholic beverages -Take any medication unless instructed by your physician -Make any legal decisions or sign important papers.  Meals: Start with liquid foods such as gelatin or soup. Progress to regular foods as tolerated. Avoid greasy, spicy, heavy foods. If nausea and/or vomiting occur, drink only clear liquids until the nausea and/or vomiting subsides. Call your physician if vomiting continues.  Special Instructions/Symptoms: Your throat may feel dry or sore from the anesthesia or the breathing tube placed in your throat during surgery. If this causes discomfort, gargle with warm salt water. The discomfort should disappear within 24 hours.  If you had a scopolamine patch placed behind your ear for the management of post- operative nausea and/or vomiting:  1. The medication in the patch is effective for 72 hours, after which it should be removed.  Wrap patch in a tissue and discard in the trash. Wash hands thoroughly with soap and water. 2. You may remove the patch earlier than 72 hours if you experience unpleasant side effects which may include dry mouth, dizziness or visual disturbances. 3.  Avoid touching the patch. Wash your hands with soap and water after contact with the patch.      

## 2019-09-07 NOTE — Anesthesia Procedure Notes (Signed)
Procedure Name: LMA Insertion Date/Time: 09/07/2019 1:25 PM Performed by: Willa Frater, CRNA Pre-anesthesia Checklist: Patient identified, Emergency Drugs available, Suction available and Patient being monitored Patient Re-evaluated:Patient Re-evaluated prior to induction Oxygen Delivery Method: Circle system utilized Preoxygenation: Pre-oxygenation with 100% oxygen Induction Type: IV induction Ventilation: Mask ventilation without difficulty LMA: LMA inserted LMA Size: 5.0 Number of attempts: 1 Airway Equipment and Method: Bite block Placement Confirmation: positive ETCO2 Tube secured with: Tape Dental Injury: Teeth and Oropharynx as per pre-operative assessment

## 2019-09-07 NOTE — Anesthesia Postprocedure Evaluation (Signed)
Anesthesia Post Note  Patient: Joel Marshall  Procedure(s) Performed: Excision of bilateral lower eye lid cysts (Bilateral Eye)     Patient location during evaluation: PACU Anesthesia Type: General Level of consciousness: awake and alert Pain management: pain level controlled Vital Signs Assessment: post-procedure vital signs reviewed and stable Respiratory status: spontaneous breathing, nonlabored ventilation, respiratory function stable and patient connected to nasal cannula oxygen Cardiovascular status: blood pressure returned to baseline and stable Postop Assessment: no apparent nausea or vomiting Anesthetic complications: no    Last Vitals:  Vitals:   09/07/19 1109 09/07/19 1355  BP: 113/73   Pulse: 67   Resp: 20 (P) 14  Temp: 36.6 C (P) 36.4 C  SpO2: 100%     Last Pain:  Vitals:   09/07/19 1355  TempSrc:   PainSc: (P) Asleep                 Barnet Glasgow

## 2019-09-07 NOTE — Transfer of Care (Signed)
Immediate Anesthesia Transfer of Care Note  Patient: Joel Marshall  Procedure(s) Performed: Excision of bilateral lower eye lid cysts (Bilateral Eye)  Patient Location: PACU  Anesthesia Type:General  Level of Consciousness: awake, alert , oriented and drowsy  Airway & Oxygen Therapy: Patient Spontanous Breathing and Patient connected to face mask oxygen  Post-op Assessment: Report given to RN and Post -op Vital signs reviewed and stable  Post vital signs: Reviewed and stable  Last Vitals:  Vitals Value Taken Time  BP 127/77 09/07/19 1355  Temp    Pulse 67 09/07/19 1357  Resp 12 09/07/19 1357  SpO2 100 % 09/07/19 1357  Vitals shown include unvalidated device data.  Last Pain:  Vitals:   09/07/19 1109  TempSrc: Temporal  PainSc: 0-No pain      Patients Stated Pain Goal: 1 ([id number])  Complications: No apparent anesthesia complications

## 2019-09-07 NOTE — Anesthesia Preprocedure Evaluation (Addendum)
Anesthesia Evaluation  Patient identified by MRN, date of birth, ID band Patient awake    Reviewed: Allergy & Precautions, NPO status , Patient's Chart, lab work & pertinent test results  History of Anesthesia Complications Negative for: history of anesthetic complications  Airway Mallampati: II  TM Distance: >3 FB Neck ROM: Full    Dental no notable dental hx.    Pulmonary sleep apnea ,    Pulmonary exam normal        Cardiovascular hypertension, Pt. on medications Normal cardiovascular exam     Neuro/Psych Anxiety negative neurological ROS     GI/Hepatic negative GI ROS, Neg liver ROS,   Endo/Other  negative endocrine ROS  Renal/GU negative Renal ROS  negative genitourinary   Musculoskeletal negative musculoskeletal ROS (+)   Abdominal   Peds  Hematology negative hematology ROS (+)   Anesthesia Other Findings Day of surgery medications reviewed with patient.  Reproductive/Obstetrics negative OB ROS                            Anesthesia Physical Anesthesia Plan  ASA: II  Anesthesia Plan: General   Post-op Pain Management:    Induction: Intravenous  PONV Risk Score and Plan: 2 and Treatment may vary due to age or medical condition, Ondansetron, Dexamethasone and Midazolam  Airway Management Planned: LMA  Additional Equipment: None  Intra-op Plan:   Post-operative Plan: Extubation in OR  Informed Consent: I have reviewed the patients History and Physical, chart, labs and discussed the procedure including the risks, benefits and alternatives for the proposed anesthesia with the patient or authorized representative who has indicated his/her understanding and acceptance.     Dental advisory given  Plan Discussed with: CRNA  Anesthesia Plan Comments:        Anesthesia Quick Evaluation

## 2019-09-07 NOTE — Op Note (Signed)
DATE OF OPERATION: 09/07/2019  LOCATION: [facility]  PREOPERATIVE DIAGNOSIS: bilateral lower eyelid cysts  POSTOPERATIVE DIAGNOSIS: Same  PROCEDURE:  1. Excision of right lower eyelid cyst 5 mm 2. Excision of left lower eyelid cyst medial 7 mm 3. Excision of left lower eyelid cyst lateral 4 mm  SURGEON: Saabir Blyth Sanger Zyir Gassert, DO  ASSISTANT: Phoebe Sharps, PA  EBL: none  CONDITION: Stable  COMPLICATIONS: None  INDICATION: The patient, Joel Marshall, is a 39 y.o. male born on [birth year], is here for treatment of several lower eyelid cysts.   PROCEDURE DETAILS:  The patient was seen prior to surgery and marked.  The IV antibiotics were given. The patient was taken to the operating room and given a general anesthetic. A standard time out was performed and all information was confirmed by those in the room.  Right:  Local was injected for intraoperative hemostasis.  The tissue scissors were used to cut the skin over the cyst.  The small pickups were used to hold the cyst sac.  The 5 mm sac was excised.  Hemostasis was achieved with electrocautery.  Derma bond was applied.  Left medial:  Local was injected for intraoperative hemostasis.  The tissue scissors were used to cut the skin over the cyst.  The small pickups were used to hold the cyst sac.  The 7 mm sac was excised.  Hemostasis was achieved with electrocautery.    Left lateral:  Local was injected for intraoperative hemostasis.  The tissue scissors were used to cut the skin over the cyst.  The small pickups were used to hold the cyst sac.  The 4 mm sac was excised.  Hemostasis was achieved with electrocautery.  A 5-0 Monocryl suture was placed with one stitch. Derma bond was applied.  The patient was allowed to wake up and taken to recovery room in stable condition at the end of the case. The family was notified at the end of the case.   The advanced practice practitioner (APP) assisted throughout the case.   The APP was essential in retraction and counter traction when needed to make the case progress smoothly.  This retraction and assistance made it possible to see the tissue plans for the procedure.  The assistance was needed for blood control, tissue re-approximation and assisted with closure of the incision site.  The [place] was signed into law in 2016 which includes the topic of electronic health records.  This provides immediate access to information in MyChart.  This includes consultation notes, operative notes, office notes, lab results and pathology reports.  If you have any questions about what you read please let us know at your next visit or call us at the office.  We are right here with you.

## 2019-09-08 ENCOUNTER — Encounter: Payer: Self-pay | Admitting: Plastic Surgery

## 2019-09-08 ENCOUNTER — Encounter: Payer: Self-pay | Admitting: *Deleted

## 2019-09-08 LAB — SURGICAL PATHOLOGY

## 2019-09-08 NOTE — Telephone Encounter (Signed)
Spoke with patient. Use eye drops at night for 3 days. May wash face, gentle around eye area, no scrubbing around eyes. Encourage use of Ibuprofen/Tylenol, advised to use Norco only if needed. He requested work note for Monday, plans to return to work Tuesday.

## 2019-09-11 ENCOUNTER — Ambulatory Visit: Payer: Self-pay

## 2019-09-11 ENCOUNTER — Other Ambulatory Visit: Payer: Self-pay

## 2019-09-11 DIAGNOSIS — Z021 Encounter for pre-employment examination: Secondary | ICD-10-CM

## 2019-09-11 NOTE — Progress Notes (Signed)
Presents for pre-employment drug screen. Specimen collected using [employer] Chain of Custody form for [facility] account number [id number]. Specimen [id number]  AMD

## 2019-09-14 ENCOUNTER — Telehealth: Payer: Self-pay | Admitting: Plastic Surgery

## 2019-09-14 NOTE — Telephone Encounter (Signed)

## 2019-09-15 ENCOUNTER — Other Ambulatory Visit: Payer: Self-pay

## 2019-09-15 ENCOUNTER — Ambulatory Visit (INDEPENDENT_AMBULATORY_CARE_PROVIDER_SITE_OTHER): Payer: No Typology Code available for payment source | Admitting: Plastic Surgery

## 2019-09-15 ENCOUNTER — Encounter: Payer: Self-pay | Admitting: Plastic Surgery

## 2019-09-15 DIAGNOSIS — D2310 Other benign neoplasm of skin of unspecified eyelid, including canthus: Secondary | ICD-10-CM | POA: Insufficient documentation

## 2019-09-15 NOTE — Progress Notes (Signed)
The patient is a 39 year old male here for follow-up after undergoing excision of 3 cysts around his eyelids.  The path came as a hidrocystoma.  His son accidentally pulled out one of the stitches.  There is a little irritation to the area but all 3 sites are healing very well.  There is no sign of infection.  He is very pleased and relieved.  I recommended Johnson's baby shampoo washes once a week.  Call with any concerns.  Let us know if there is any recurrence.  There is certainly the risk of recurrence but we did get the sac out so I am hopeful that it will not.  Pictures were obtained of the patient and placed in the chart with the patient's or guardian's permission.    The [place] was signed into law in 2016 which includes the topic of electronic health records.  This provides immediate access to information in MyChart.  This includes consultation notes, operative notes, office notes, lab results and pathology reports.  If you have any questions about what you read please let us know at your next visit or call us at the office.  We are right here with you.

## 2019-09-18 ENCOUNTER — Ambulatory Visit: Payer: Self-pay

## 2019-10-31 MED FILL — HYDROCHLOROTHIAZIDE 25 MG T: 25 | 90 days supply | Qty: 90 | Fill #1

## 2019-11-05 ENCOUNTER — Other Ambulatory Visit: Payer: Self-pay

## 2019-11-05 ENCOUNTER — Encounter (HOSPITAL_COMMUNITY): Payer: Self-pay | Admitting: Emergency Medicine

## 2019-11-05 ENCOUNTER — Ambulatory Visit (HOSPITAL_COMMUNITY)
Admission: EM | Admit: 2019-11-05 | Discharge: 2019-11-05 | Disposition: A | Discharge status: Home / Self Care | Payer: 59 | Attending: Emergency Medicine | Admitting: Emergency Medicine

## 2019-11-05 ENCOUNTER — Ambulatory Visit (INDEPENDENT_AMBULATORY_CARE_PROVIDER_SITE_OTHER): Payer: 59

## 2019-11-05 DIAGNOSIS — S82832A Other fracture of upper and lower end of left fibula, initial encounter for closed fracture: Secondary | ICD-10-CM | POA: Diagnosis not present

## 2019-11-05 DIAGNOSIS — M25572 Pain in left ankle and joints of left foot: Secondary | ICD-10-CM | POA: Diagnosis not present

## 2019-11-05 MED ORDER — IBUPROFEN 800 MG PO TABS
ORAL_TABLET | ORAL | Status: AC
  Filled 2019-11-05: qty 1

## 2019-11-05 MED ORDER — IBUPROFEN 800 MG PO TABS
800.0000 mg | ORAL_TABLET | Freq: Once | ORAL | Status: AC
  Administered 2019-11-05: 800 mg via ORAL

## 2019-11-05 MED ORDER — IBUPROFEN 800 MG PO TABS: 800.0000 mg | ORAL_TABLET | Freq: Three times a day (TID) | ORAL | 0 refills | Status: DC

## 2019-11-05 MED ORDER — HYDROCODONE-ACETAMINOPHEN 5-325 MG PO TABS: 2.0000 | ORAL_TABLET | Freq: Four times a day (QID) | ORAL | 0 refills | Status: DC | PRN

## 2019-11-05 NOTE — ED Triage Notes (Signed)
PT fell rollerskating today. Pain in left ankle, left foot, and left knee pain.

## 2019-11-05 NOTE — ED Notes (Signed)
ER Ortho tech in for splint placement.

## 2019-11-05 NOTE — ED Notes (Signed)
Ortho tech aware, they will be delayed.

## 2019-11-05 NOTE — Discharge Instructions (Signed)
Your xray does show that you have a fracture to your fibula.  We will immobilize your ankle with a splint. Keep this in place and not to get wet.  Use of crutches.  Ice, elevation, regular ibuprofen to help with pain.  Hydrocodone to help with breakthrough pain. May cause drowsiness, and may cause constipation.  Please call orthopedics tomorrow to set up follow up appointment for definitive treatment.

## 2019-11-05 NOTE — ED Provider Notes (Signed)
[place]    CSN: [id number] Arrival date & time: 11/05/19  1511      History   Chief Complaint Chief Complaint  Patient presents with  . Leg Pain    HPI Joel Marshall is a 39 y.o. male.   Joel Marshall presents with complaints of left ankle pain after a fall while on roller skates today, approximately 1 hour before arrival. His right foot went out, causing him to land onto left foot, it twisted and he heard a "pop" and he fell. Pain with weight bearing. Hasn't taken any medications for pain. No numbness or tingling. Swelling present. Denies any previous ankle injury.    ROS per HPI, negative if not otherwise mentioned.      Past Medical History:  Diagnosis Date  . Borderline diabetes   . Breast mass 10/15/2016   behind nipple; R  . History of intestine removal   . Panic attacks   . Sleep apnea    had used cpap but has not used it in several months    Patient Active Problem List   Diagnosis Date Noted  . Hidrocystoma of eyelid 09/15/2019  . Hyperlipidemia 10/20/2018  . Pre-diabetes 05/05/2017  . Class 1 obesity due to excess calories with body mass index (BMI) of 33.0 to 33.9 in adult 11/09/2016  . Essential hypertension 08/19/2016  . Palpitations 08/19/2016  . Obstructive sleep apnea 08/19/2016    Past Surgical History:  Procedure Laterality Date  . CYST EXCISION Bilateral 09/07/2019   Procedure: Excision of bilateral lower eye lid cysts;  Surgeon: Wallace Going, DO;  Location: [facility];  Service: Plastics;  Laterality: Bilateral;  . [place]   "infected" intestine       Home Medications    Prior to Admission medications   Medication Sig Start Date End Date Taking? Authorizing Provider  BYSTOLIC 10 MG tablet TAKE 1 TABLET BY MOUTH DAILY. 05/22/19  Yes Lorretta Harp, MD  hydrochlorothiazide (HYDRODIURIL) 25 MG tablet TAKE 1 TABLET BY MOUTH DAILY. 07/18/19  Yes Lorretta Harp, MD  erythromycin  ophthalmic ointment Place 1 application into both eyes at bedtime. Patient not taking: Reported on 09/15/2019 08/29/19   Dillingham, Loel Lofty, DO  HYDROcodone-acetaminophen (NORCO/VICODIN) 5-325 MG tablet Take 2 tablets by mouth every 6 (six) hours as needed. 11/05/19   Zigmund Gottron, NP  ibuprofen (ADVIL) 800 MG tablet Take 1 tablet (800 mg total) by mouth 3 (three) times daily. 11/05/19   Zigmund Gottron, NP    Family History Family History  Problem Relation Age of Onset  . Rheum arthritis Mother   . Hypertension Father   . Breast cancer Other        cousin  . Colon cancer Other 57       cousin  . HIV Brother     Social History Social History   Tobacco Use  . Smoking status: Never Smoker  . Smokeless tobacco: Never Used  Substance Use Topics  . Alcohol use: No  . Drug use: No     Allergies   Patient has no known allergies.   Review of Systems Review of Systems   Physical Exam Triage Vital Signs ED Triage Vitals  Enc Vitals Group     BP 11/05/19 1544 137/87     Pulse Rate 11/05/19 1544 80     Resp 11/05/19 1544 16     Temp 11/05/19 1544 99 F (37.2 C)  Temp Source 11/05/19 1544 Oral     SpO2 11/05/19 1544 96 %     Weight --      Height --      Head Circumference --      Peak Flow --      Pain Score 11/05/19 1546 8     Pain Loc --      Pain Edu? --      Excl. in [place]? --    No data found.  Updated Vital Signs BP 137/87   Pulse 80   Temp 99 F (37.2 C) (Oral)   Resp 16   SpO2 96%   Visual Acuity Right Eye Distance:   Left Eye Distance:   Bilateral Distance:    Right Eye Near:   Left Eye Near:    Bilateral Near:     Physical Exam Musculoskeletal:     Left ankle: Swelling present. Tenderness present over the lateral malleolus and medial malleolus. Normal range of motion.     Left foot: Normal. No tenderness or bony tenderness. Normal pulse.     Comments: Swelling and tenderness to generalized ankle but primarily to left lateral malleolus  and surrounding soft tissues      UC Treatments / Results  Labs (all labs ordered are listed, but only abnormal results are displayed) Labs Reviewed - No data to display  EKG   Radiology DG Ankle Complete Left  Result Date: 11/05/2019 CLINICAL DATA:  Pain EXAM: LEFT ANKLE COMPLETE - 3+ VIEW COMPARISON:  None. FINDINGS: There is an acute fracture of the distal fibula/lateral malleolus. There is some mild widening of the medial clear space. There is extensive surrounding soft tissue swelling. IMPRESSION: Acute fracture of the distal fibula with mild widening of the medial clear space. Electronically Signed   By: Constance Holster M.D.   On: 11/05/2019 16:11    Procedures Procedures (including critical care time)  Medications Ordered in UC Medications  ibuprofen (ADVIL) tablet 800 mg (800 mg Oral Given 11/05/19 1609)    Initial Impression / Assessment and Plan / UC Course  I have reviewed the triage vital signs and the nursing notes.  Pertinent labs & imaging results that were available during my care of the patient were reviewed by me and considered in my medical decision making (see chart for details).     Left fibular fracture with mild widening of medial clear space. Posterior and stirrup splint applied per ortho tech, crutches provided. Pain management discussed, to follow up with orthopedics for definitive evaluation and management. Patient verbalized understanding and agreeable to plan.   Final Clinical Impressions(s) / UC Diagnoses   Final diagnoses:  Closed fracture of distal end of left fibula, unspecified fracture morphology, initial encounter     Discharge Instructions     Your xray does show that you have a fracture to your fibula.  We will immobilize your ankle with a splint. Keep this in place and not to get wet.  Use of crutches.  Ice, elevation, regular ibuprofen to help with pain.  Hydrocodone to help with breakthrough pain. May cause drowsiness, and  may cause constipation.  Please call orthopedics tomorrow to set up follow up appointment for definitive treatment.     ED Prescriptions    Medication Sig Dispense Auth. Provider   ibuprofen (ADVIL) 800 MG tablet Take 1 tablet (800 mg total) by mouth 3 (three) times daily. 30 tablet Augusto Gamble B, NP   HYDROcodone-acetaminophen (NORCO/VICODIN) 5-325 MG tablet Take 2 tablets  by mouth every 6 (six) hours as needed. 10 tablet Zigmund Gottron, NP     I have reviewed the PDMP during this encounter.   Zigmund Gottron, NP 11/05/19 1625

## 2019-11-05 NOTE — ED Notes (Signed)
Notified pt that there is a wait for ortho tech 2/2 ER needs for trauma/ortho. Pt verbalized understanding.

## 2019-11-06 ENCOUNTER — Encounter: Payer: Self-pay | Admitting: Physician Assistant

## 2019-11-06 DIAGNOSIS — M25572 Pain in left ankle and joints of left foot: Secondary | ICD-10-CM | POA: Diagnosis not present

## 2019-11-06 DIAGNOSIS — S8265XA Nondisplaced fracture of lateral malleolus of left fibula, initial encounter for closed fracture: Secondary | ICD-10-CM | POA: Diagnosis not present

## 2019-11-06 MED FILL — HYDROCODON-APAP 5-325: 5-325 | 2 days supply | Qty: 10 | Fill #0

## 2019-11-06 MED FILL — IBUPROFEN 800 MG TABS: 800 | 10 days supply | Qty: 30 | Fill #0

## 2019-11-06 NOTE — Telephone Encounter (Signed)
Please advise 

## 2019-11-08 ENCOUNTER — Ambulatory Visit: Payer: 59 | Admitting: Orthopaedic Surgery

## 2019-11-15 DIAGNOSIS — S8261XD Displaced fracture of lateral malleolus of right fibula, subsequent encounter for closed fracture with routine healing: Secondary | ICD-10-CM | POA: Diagnosis not present

## 2019-11-15 DIAGNOSIS — M25572 Pain in left ankle and joints of left foot: Secondary | ICD-10-CM | POA: Diagnosis not present

## 2019-11-15 DIAGNOSIS — S8263XA Displaced fracture of lateral malleolus of unspecified fibula, initial encounter for closed fracture: Secondary | ICD-10-CM | POA: Insufficient documentation

## 2019-11-29 ENCOUNTER — Other Ambulatory Visit: Payer: Self-pay | Admitting: Cardiovascular Disease

## 2019-11-29 DIAGNOSIS — S8262XD Displaced fracture of lateral malleolus of left fibula, subsequent encounter for closed fracture with routine healing: Secondary | ICD-10-CM | POA: Diagnosis not present

## 2019-11-29 DIAGNOSIS — S8261XD Displaced fracture of lateral malleolus of right fibula, subsequent encounter for closed fracture with routine healing: Secondary | ICD-10-CM | POA: Diagnosis not present

## 2019-11-29 DIAGNOSIS — M25572 Pain in left ankle and joints of left foot: Secondary | ICD-10-CM | POA: Diagnosis not present

## 2019-11-29 MED FILL — BYSTOLIC 10 MG TABLET: 10 | 30 days supply | Qty: 30 | Fill #0

## 2019-12-27 DIAGNOSIS — M25572 Pain in left ankle and joints of left foot: Secondary | ICD-10-CM | POA: Diagnosis not present

## 2019-12-27 DIAGNOSIS — S8261XD Displaced fracture of lateral malleolus of right fibula, subsequent encounter for closed fracture with routine healing: Secondary | ICD-10-CM | POA: Diagnosis not present

## 2020-01-01 ENCOUNTER — Other Ambulatory Visit: Payer: Self-pay | Admitting: Cardiovascular Disease

## 2020-01-02 MED FILL — BYSTOLIC 10 MG TABLET: 10 | 14 days supply | Qty: 14 | Fill #0

## 2020-01-17 ENCOUNTER — Encounter: Payer: Self-pay | Admitting: Physician Assistant

## 2020-01-17 ENCOUNTER — Ambulatory Visit (INDEPENDENT_AMBULATORY_CARE_PROVIDER_SITE_OTHER): Payer: 59 | Admitting: Physician Assistant

## 2020-01-17 ENCOUNTER — Other Ambulatory Visit: Payer: Self-pay

## 2020-01-17 VITALS — BP 128/90 | HR 72 | Temp 97.5°F | Ht 72.0 in | Wt 264.0 lb

## 2020-01-17 DIAGNOSIS — R310 Gross hematuria: Secondary | ICD-10-CM | POA: Diagnosis not present

## 2020-01-17 DIAGNOSIS — Z0001 Encounter for general adult medical examination with abnormal findings: Secondary | ICD-10-CM

## 2020-01-17 DIAGNOSIS — Z136 Encounter for screening for cardiovascular disorders: Secondary | ICD-10-CM | POA: Diagnosis not present

## 2020-01-17 DIAGNOSIS — Z0000 Encounter for general adult medical examination without abnormal findings: Secondary | ICD-10-CM | POA: Diagnosis not present

## 2020-01-17 DIAGNOSIS — Z13220 Encounter for screening for lipoid disorders: Secondary | ICD-10-CM

## 2020-01-17 DIAGNOSIS — R7303 Prediabetes: Secondary | ICD-10-CM

## 2020-01-17 DIAGNOSIS — E669 Obesity, unspecified: Secondary | ICD-10-CM | POA: Diagnosis not present

## 2020-01-17 DIAGNOSIS — I10 Essential (primary) hypertension: Secondary | ICD-10-CM

## 2020-01-17 LAB — URINALYSIS, ROUTINE W REFLEX MICROSCOPIC
Bilirubin Urine: NEGATIVE
Ketones, ur: NEGATIVE
Leukocytes,Ua: NEGATIVE
Nitrite: NEGATIVE
Specific Gravity, Urine: 1.025 (ref 1.000–1.030)
Total Protein, Urine: NEGATIVE
Urine Glucose: NEGATIVE
Urobilinogen, UA: 0.2 (ref 0.0–1.0)
pH: 6.5 (ref 5.0–8.0)

## 2020-01-17 LAB — CBC WITH DIFFERENTIAL/PLATELET
Basophils Absolute: 0.1 10*3/uL (ref 0.0–0.1)
Basophils Relative: 1.9 % (ref 0.0–3.0)
Eosinophils Absolute: 0.1 10*3/uL (ref 0.0–0.7)
Eosinophils Relative: 2 % (ref 0.0–5.0)
HCT: 38.9 % — ABNORMAL LOW (ref 39.0–52.0)
Hemoglobin: 13.5 g/dL (ref 13.0–17.0)
Lymphocytes Relative: 30.7 % (ref 12.0–46.0)
Lymphs Abs: 1.9 10*3/uL (ref 0.7–4.0)
MCHC: 34.6 g/dL (ref 30.0–36.0)
MCV: 85.2 fl (ref 78.0–100.0)
Monocytes Absolute: 0.6 10*3/uL (ref 0.1–1.0)
Monocytes Relative: 9.9 % (ref 3.0–12.0)
Neutro Abs: 3.5 10*3/uL (ref 1.4–7.7)
Neutrophils Relative %: 55.5 % (ref 43.0–77.0)
Platelets: 304 10*3/uL (ref 150.0–400.0)
RBC: 4.56 Mil/uL (ref 4.22–5.81)
RDW: 14.1 % (ref 11.5–15.5)
WBC: 6.3 10*3/uL (ref 4.0–10.5)

## 2020-01-17 LAB — LIPID PANEL
Cholesterol: 225 mg/dL — ABNORMAL HIGH (ref 0–200)
HDL: 43.8 mg/dL (ref >39.00)
LDL Cholesterol: 147 mg/dL — ABNORMAL HIGH (ref 0–99)
NonHDL: 181.52
Total CHOL/HDL Ratio: 5
Triglycerides: 174 mg/dL — ABNORMAL HIGH (ref 0.0–149.0)
VLDL: 34.8 mg/dL (ref 0.0–40.0)

## 2020-01-17 LAB — COMPREHENSIVE METABOLIC PANEL
ALT: 15 U/L (ref 0–53)
AST: 18 U/L (ref 0–37)
Albumin: 4.5 g/dL (ref 3.5–5.2)
Alkaline Phosphatase: 52 U/L (ref 39–117)
BUN: 15 mg/dL (ref 6–23)
CO2: 35 mEq/L — ABNORMAL HIGH (ref 19–32)
Calcium: 10.2 mg/dL (ref 8.4–10.5)
Chloride: 99 mEq/L (ref 96–112)
Creatinine, Ser: 1.24 mg/dL (ref 0.40–1.50)
GFR: 78.39 mL/min (ref >60.00)
Glucose, Bld: 104 mg/dL — ABNORMAL HIGH (ref 70–99)
Potassium: 4.1 mEq/L (ref 3.5–5.1)
Sodium: 139 mEq/L (ref 135–145)
Total Bilirubin: 0.4 mg/dL (ref 0.2–1.2)
Total Protein: 7.4 g/dL (ref 6.0–8.3)

## 2020-01-17 LAB — HEMOGLOBIN A1C: Hgb A1c MFr Bld: 5.9 % (ref 4.6–6.5)

## 2020-01-17 MED ORDER — NEBIVOLOL HCL 10 MG PO TABS: 10.0000 mg | ORAL_TABLET | Freq: Every day | ORAL | 0 refills | Status: DC

## 2020-01-17 NOTE — Patient Instructions (Signed)
It was great to see you!  *Please schedule an appointment with Dr. Gwenlyn Found *Please schedule an eye appointment *Please scheduled your COVID-19 vaccine  Please go to the lab for blood work.   Our office will call you with your results unless you have chosen to receive results via MyChart.  If your blood work is normal we will follow-up each year for physicals and as scheduled for chronic medical problems.  If anything is abnormal we will treat accordingly and get you in for a follow-up.  Take care,  [facility] Maintenance, Male Adopting a healthy lifestyle and getting preventive care are important in promoting health and wellness. Ask your health care provider about:  The right schedule for you to have regular tests and exams.  Things you can do on your own to prevent diseases and keep yourself healthy. What should I know about diet, weight, and exercise? Eat a healthy diet   Eat a diet that includes plenty of vegetables, fruits, low-fat dairy products, and lean protein.  Do not eat a lot of foods that are high in solid fats, added sugars, or sodium. Maintain a healthy weight Body mass index (BMI) is a measurement that can be used to identify possible weight problems. It estimates body fat based on height and weight. Your health care provider can help determine your BMI and help you achieve or maintain a healthy weight. Get regular exercise Get regular exercise. This is one of the most important things you can do for your health. Most adults should:  Exercise for at least 150 minutes each week. The exercise should increase your heart rate and make you sweat (moderate-intensity exercise).  Do strengthening exercises at least twice a week. This is in addition to the moderate-intensity exercise.  Spend less time sitting. Even light physical activity can be beneficial. Watch cholesterol and blood lipids Have your blood tested for lipids and cholesterol at 39 years of age,  then have this test every 5 years. You may need to have your cholesterol levels checked more often if:  Your lipid or cholesterol levels are high.  You are older than 39 years of age.  You are at high risk for heart disease. What should I know about cancer screening? Many types of cancers can be detected early and may often be prevented. Depending on your health history and family history, you may need to have cancer screening at various ages. This may include screening for:  Colorectal cancer.  Prostate cancer.  Skin cancer.  Lung cancer. What should I know about heart disease, diabetes, and high blood pressure? Blood pressure and heart disease  High blood pressure causes heart disease and increases the risk of stroke. This is more likely to develop in people who have high blood pressure readings, are of African descent, or are overweight.  Talk with your health care provider about your target blood pressure readings.  Have your blood pressure checked: ? Every 3-5 years if you are 81-84 years of age. ? Every year if you are 46 years old or older.  If you are between the ages of 26 and 3 and are a current or former smoker, ask your health care provider if you should have a one-time screening for abdominal aortic aneurysm (AAA). Diabetes Have regular diabetes screenings. This checks your fasting blood sugar level. Have the screening done:  Once every three years after age 31 if you are at a normal weight and have a low risk for  diabetes.  More often and at a younger age if you are overweight or have a high risk for diabetes. What should I know about preventing infection? Hepatitis B If you have a higher risk for hepatitis B, you should be screened for this virus. Talk with your health care provider to find out if you are at risk for hepatitis B infection. Hepatitis C Blood testing is recommended for:  Everyone born from [birth year] through 1965.  Anyone with known risk factors  for hepatitis C. Sexually transmitted infections (STIs)  You should be screened each year for STIs, including gonorrhea and chlamydia, if: ? You are sexually active and are younger than 39 years of age. ? You are older than 39 years of age and your health care provider tells you that you are at risk for this type of infection. ? Your sexual activity has changed since you were last screened, and you are at increased risk for chlamydia or gonorrhea. Ask your health care provider if you are at risk.  Ask your health care provider about whether you are at high risk for HIV. Your health care provider may recommend a prescription medicine to help prevent HIV infection. If you choose to take medicine to prevent HIV, you should first get tested for HIV. You should then be tested every 3 months for as long as you are taking the medicine. Follow these instructions at home: Lifestyle  Do not use any products that contain nicotine or tobacco, such as cigarettes, e-cigarettes, and chewing tobacco. If you need help quitting, ask your health care provider.  Do not use street drugs.  Do not share needles.  Ask your health care provider for help if you need support or information about quitting drugs. Alcohol use  Do not drink alcohol if your health care provider tells you not to drink.  If you drink alcohol: ? Limit how much you have to 0-2 drinks a day. ? Be aware of how much alcohol is in your drink. In the [place] one drink equals one 12 oz bottle of beer (355 mL), one 5 oz glass of wine (148 mL), or one 1 oz glass of hard liquor (44 mL). General instructions  Schedule regular health, dental, and eye exams.  Stay current with your vaccines.  Tell your health care provider if: ? You often feel depressed. ? You have ever been abused or do not feel safe at home. Summary  Adopting a healthy lifestyle and getting preventive care are important in promoting health and wellness.  Follow your health  care provider's instructions about healthy diet, exercising, and getting tested or screened for diseases.  Follow your health care provider's instructions on monitoring your cholesterol and blood pressure. This information is not intended to replace advice given to you by your health care provider. Make sure you discuss any questions you have with your health care provider. Document Revised: 07/27/2018 Document Reviewed: 07/27/2018 Elsevier Patient Education  2020 [employer].

## 2020-01-17 NOTE — Progress Notes (Signed)
I acted as a Education administrator for [employer], PA-C Anselmo Pickler, LPN   Subjective:    Joel Marshall is a 39 y.o. male and is here for a comprehensive physical exam.  HPI  There are no preventive care reminders to display for this patient.  Acute Concerns: Hematuria  -- Pt c/o blood in urine and dysuria on 5/22 & 5/23, drank cranberry juice and it went away. No history of kidney stones.  Chronic Issues: HTN -- Currently taking bystolic 10 mg and HCTZ 25 mg. At home blood pressure readings are: not checked. Patient denies chest pain, SOB, blurred vision, dizziness, unusual headaches, lower leg swelling. Patient is compliant with medication. Denies excessive caffeine intake, stimulant usage, excessive alcohol intake, or increase in salt consumption. He is overdue to schedule with cardiology and is currently down to 1 pill of his bystolic.  BP Readings from Last 3 Encounters:  01/17/20 128/90  11/05/19 137/87  09/15/19 (!) 149/98   Prediabetes --  Continues to gain weight. He has a significantly longer commute now and much less time for exercise and cooking healthy meals. Lab Results  Component Value Date   HGBA1C 5.8 10/10/2018     Health Maintenance: Immunizations -- UTD Colonoscopy -- N/A PSA -- N/A Diet -- eats out for lunch every day; limited sodas Caffeine intake -- coffee x 2 Sleep habits -- overall ok, not using CPAP Exercise -- limited Weight -- Weight: 264 lb (119.7 kg)  Weight history Wt Readings from Last 10 Encounters:  01/17/20 264 lb (119.7 kg)  09/15/19 261 lb (118.4 kg)  09/07/19 260 lb 9.3 oz (118.2 kg)  08/29/19 261 lb 6.4 oz (118.6 kg)  03/21/19 260 lb (117.9 kg)  10/20/18 259 lb 12.8 oz (117.8 kg)  10/05/18 261 lb 8 oz (118.6 kg)  07/13/18 255 lb (115.7 kg)  07/08/18 258 lb 4 oz (117.1 kg)  12/15/17 253 lb 4 oz (114.9 kg)   Body mass index is 35.8 kg/m. Mood -- overall good Tobacco use -- none Alcohol use --- none  Depression screen PHQ 2/9 01/17/2020   Decreased Interest 0  Down, Depressed, Hopeless 0  PHQ - 2 Score 0  Altered sleeping -  Tired, decreased energy -  Change in appetite -  Feeling bad or failure about yourself  -  Trouble concentrating -  Moving slowly or fidgety/restless -  Suicidal thoughts -  PHQ-9 Score -  Difficult doing work/chores -     Other providers/specialists: Patient Care Team: Inda Coke, [place] as PCP - General (Physician Assistant)   PMHx, SurgHx, SocialHx, Medications, and Allergies were reviewed in the Visit Navigator and updated as appropriate.   Past Medical History:  Diagnosis Date  . Borderline diabetes   . Breast mass 10/15/2016   behind nipple; R  . History of intestine removal   . Panic attacks   . Sleep apnea    had used cpap but has not used it in several months     Past Surgical History:  Procedure Laterality Date  . CYST EXCISION Bilateral 09/07/2019   Procedure: Excision of bilateral lower eye lid cysts;  Surgeon: Wallace Going, DO;  Location: [facility];  Service: Plastics;  Laterality: Bilateral;  . SMALL INTESTINE SURGERY  1983   "infected" intestine     Family History  Problem Relation Age of Onset  . Rheum arthritis Mother   . Hypertension Father   . Breast cancer Other        cousin  .  Colon cancer Other 69       cousin  . HIV Brother     Social History   Tobacco Use  . Smoking status: Never Smoker  . Smokeless tobacco: Never Used  Substance Use Topics  . Alcohol use: No  . Drug use: No    Review of Systems:   Review of Systems  Constitutional: Negative.  Negative for chills, fever, malaise/fatigue and weight loss.  HENT: Negative.  Negative for hearing loss, sinus pain and sore throat.   Eyes: Negative.  Negative for blurred vision.  Respiratory: Negative.  Negative for cough and shortness of breath.   Cardiovascular: Negative.  Negative for chest pain, palpitations and leg swelling.  Gastrointestinal: Negative.   Negative for abdominal pain, constipation, diarrhea, heartburn, nausea and vomiting.  Genitourinary: Positive for hematuria. Negative for dysuria, frequency and urgency.  Musculoskeletal: Negative.  Negative for back pain, myalgias and neck pain.  Skin: Negative.  Negative for itching and rash.  Neurological: Positive for headaches. Negative for dizziness, tingling, seizures and loss of consciousness.  Endo/Heme/Allergies: Negative.  Negative for polydipsia.  Psychiatric/Behavioral: Negative.  Negative for depression. The patient is not nervous/anxious.     Objective:   Vitals:   01/17/20 0803  BP: 128/90  Pulse: 72  Temp: (!) 97.5 F (36.4 C)  SpO2: 97%   Body mass index is 35.8 kg/m.  General Appearance:  Alert, cooperative, no distress, appears stated age  Head:  Normocephalic, without obvious abnormality, atraumatic  Eyes:  PERRL, conjunctiva/corneas clear, EOM's intact, fundi benign, both eyes       Ears:  Normal TM's and external ear canals, both ears  Nose: Nares normal, septum midline, mucosa normal, no drainage    or sinus tenderness  Throat: Lips, mucosa, and tongue normal; teeth and gums normal  Neck: Supple, symmetrical, trachea midline, no adenopathy; thyroid:  No enlargement/tenderness/nodules; no carotit bruit or JVD  Back:   Symmetric, no curvature, ROM normal, no CVA tenderness  Lungs:   Clear to auscultation bilaterally, respirations unlabored  Chest wall:  No tenderness or deformity  Heart:  Regular rate and rhythm, S1 and S2 normal, no murmur, rub   or gallop  Abdomen:   Soft, non-tender, bowel sounds active all four quadrants, no masses, no organomegaly  Extremities: Extremities normal, atraumatic, no cyanosis or edema  Prostate: Not done.   Skin: Skin color, texture, turgor normal, no rashes or lesions  Lymph nodes: Cervical, supraclavicular, and axillary nodes normal  Neurologic: CNII-XII grossly intact. Normal strength, sensation and reflexes throughout     Assessment/Plan:   Joel Marshall was seen today for annual exam.  Diagnoses and all orders for this visit:  Encounter for general adult medical examination with abnormal findings Today patient counseled on age appropriate routine health concerns for screening and prevention, each reviewed and up to date or declined. Immunizations reviewed and up to date or declined. Labs ordered and reviewed. Risk factors for depression reviewed and negative. Hearing function and visual acuity are intact. ADLs screened and addressed as needed. Functional ability and level of safety reviewed and appropriate. Education, counseling and referrals performed based on assessed risks today. Patient provided with a copy of personalized plan for preventive services.  Essential hypertension Courtesy refill of BP medications today but I told him he must follow up with Dr. Gwenlyn Found for further refills. I encouraged working on weight loss and better diet. -     CBC with Differential/Platelet -     Comprehensive metabolic panel  Pre-diabetes Update HgbA1c today and will provide further recommendations. -     Hemoglobin A1c  Gross hematuria Currently asymptomatic. Will check urine today. Possibly passed a stone? Will send to urology if UA is concerning. -     Urine Culture -     Urinalysis, Routine w reflex microscopic  Encounter for lipid screening for cardiovascular disease -     Lipid panel  Obesity Encouraged activity and better diet as able.  Other orders -     nebivolol (BYSTOLIC) 10 MG tablet; Take 1 tablet (10 mg total) by mouth daily.  Well Adult Exam: Labs ordered: Yes. Patient counseling was done. See below for items discussed. Discussed the patient's BMI.  The BMI is not in the acceptable range; BMI management plan is completed Follow up in one year.  Patient Counseling: [x]   Nutrition: Stressed importance of moderation in sodium/caffeine intake, saturated fat and cholesterol, caloric balance, sufficient  intake of fresh fruits, vegetables, and fiber.  [x]   Stressed the importance of regular exercise.   []   Substance Abuse: Discussed cessation/primary prevention of tobacco, alcohol, or other drug use; driving or other dangerous activities under the influence; availability of treatment for abuse.   [x]   Injury prevention: Discussed safety belts, safety helmets, smoke detector, smoking near bedding or upholstery.   []   Sexuality: Discussed sexually transmitted diseases, partner selection, use of condoms, avoidance of unintended pregnancy  and contraceptive alternatives.   [x]   Dental health: Discussed importance of regular tooth brushing, flossing, and dental visits.  [x]   Health maintenance and immunizations reviewed. Please refer to Health maintenance section.    CMA or LPN served as scribe during this visit. History, Physical, and Plan performed by medical provider. The above documentation has been reviewed and is accurate and complete.  Inda Coke, PA-C [place]

## 2020-01-18 LAB — URINE CULTURE
MICRO NUMBER:: 10544091
SPECIMEN QUALITY:: ADEQUATE

## 2020-01-18 MED FILL — BYSTOLIC 10 MG TABLET: 10 | 90 days supply | Qty: 90 | Fill #0

## 2020-01-20 ENCOUNTER — Ambulatory Visit: Payer: 59 | Attending: Internal Medicine

## 2020-01-20 DIAGNOSIS — Z23 Encounter for immunization: Secondary | ICD-10-CM

## 2020-01-20 NOTE — Progress Notes (Signed)
   Covid-19 Vaccination Clinic  Name:  Joel Marshall    MRN: [id number] DOB: Jan 03, 1981  01/20/2020  Joel Marshall was observed post Covid-19 immunization for 15 minutes without incident. He was provided with Vaccine Information Sheet and instruction to access the V-Safe system.   Joel Marshall was instructed to call 911 with any severe reactions post vaccine: [name] Kitchen Difficulty breathing  . Swelling of face and throat  . A fast heartbeat  . A bad rash all over body  . Dizziness and weakness   Immunizations Administered    Name Date Dose VIS Date Route   Pfizer COVID-19 Vaccine 01/20/2020  9:46 AM 0.3 mL 10/11/2018 Intramuscular   Manufacturer: [employer]   Lot: [id number]   [place]: [id number]

## 2020-01-29 DIAGNOSIS — S8261XD Displaced fracture of lateral malleolus of right fibula, subsequent encounter for closed fracture with routine healing: Secondary | ICD-10-CM | POA: Diagnosis not present

## 2020-01-29 DIAGNOSIS — M25572 Pain in left ankle and joints of left foot: Secondary | ICD-10-CM | POA: Diagnosis not present

## 2020-02-02 MED FILL — HYDROCHLOROTHIAZIDE 25 MG T: 25 | 60 days supply | Qty: 60 | Fill #2

## 2020-02-10 ENCOUNTER — Ambulatory Visit: Payer: 59 | Attending: Internal Medicine

## 2020-02-10 DIAGNOSIS — Z23 Encounter for immunization: Secondary | ICD-10-CM

## 2020-02-10 NOTE — Progress Notes (Signed)
   Covid-19 Vaccination Clinic  Name:  Joel Marshall    MRN: [id number] DOB: 05-27-1981  02/10/2020  [name] was observed post Covid-19 immunization for 15 minutes without incident. He was provided with Vaccine Information Sheet and instruction to access the V-Safe system.   [name] was instructed to call 911 with any severe reactions post vaccine: [name] Kitchen Difficulty breathing  . Swelling of face and throat  . A fast heartbeat  . A bad rash all over body  . Dizziness and weakness   Immunizations Administered    Name Date Dose VIS Date Route   Pfizer COVID-19 Vaccine 02/10/2020  9:46 AM 0.3 mL 10/11/2018 Intramuscular   Manufacturer: [place]   Lot: [id number]   [place]: [id number]

## 2020-04-10 ENCOUNTER — Other Ambulatory Visit: Payer: Self-pay | Admitting: Physician Assistant

## 2020-04-10 ENCOUNTER — Other Ambulatory Visit: Payer: Self-pay | Admitting: Cardiovascular Disease

## 2020-04-10 ENCOUNTER — Other Ambulatory Visit: Payer: Self-pay

## 2020-04-10 MED FILL — HYDROCHLOROTHIAZIDE 25 MG T: 25 | 60 days supply | Qty: 60 | Fill #0

## 2020-04-11 DIAGNOSIS — Z03818 Encounter for observation for suspected exposure to other biological agents ruled out: Secondary | ICD-10-CM | POA: Diagnosis not present

## 2020-04-15 MED FILL — BYSTOLIC 10 MG TABLET: 10 | 90 days supply | Qty: 90 | Fill #0

## 2020-06-12 MED FILL — HYDROCHLOROTHIAZIDE 25 MG T: 25 | 60 days supply | Qty: 60 | Fill #1

## 2020-06-21 MED FILL — HYDROCHLOROTHIAZIDE 25 MG T: 25 | 60 days supply | Qty: 60 | Fill #1

## 2020-08-07 ENCOUNTER — Other Ambulatory Visit: Payer: Self-pay | Admitting: Physician Assistant

## 2020-08-08 ENCOUNTER — Other Ambulatory Visit: Payer: Self-pay | Admitting: Cardiovascular Disease

## 2020-08-08 ENCOUNTER — Other Ambulatory Visit: Payer: Self-pay | Admitting: Family Medicine

## 2020-08-08 MED ORDER — NEBIVOLOL HCL 10 MG PO TABS: 10.0000 mg | ORAL_TABLET | Freq: Every day | ORAL | 0 refills | Status: DC

## 2020-08-08 MED FILL — NEBIVOLOL HCL 10 MG TABS: 10 | 90 days supply | Qty: 90 | Fill #0

## 2020-08-08 MED FILL — HYDROCHLOROTHIAZIDE 25 MG T: 25 | 60 days supply | Qty: 60 | Fill #0

## 2020-08-26 ENCOUNTER — Other Ambulatory Visit: Payer: Self-pay

## 2020-08-26 ENCOUNTER — Other Ambulatory Visit: Payer: 59

## 2020-08-26 DIAGNOSIS — Z20822 Contact with and (suspected) exposure to covid-19: Secondary | ICD-10-CM

## 2020-08-29 LAB — NOVEL CORONAVIRUS, NAA: SARS-CoV-2, NAA: NOT DETECTED

## 2020-09-13 ENCOUNTER — Telehealth: Payer: Self-pay

## 2020-09-13 NOTE — Telephone Encounter (Signed)
Patients needing to be rescheduled due to maternity leave but patient states due to insurance his CPE needs to be done by 7/1 can we do it with another provider ?   Please advise

## 2020-09-13 NOTE — Telephone Encounter (Signed)
Pt can not due physical with another provider, can he not come in anytime soon? We have openings.

## 2020-09-13 NOTE — Telephone Encounter (Signed)
His last CPE was 01/17/20 and per insurance requirements it will not cover two CPEs within a year so we have to schedule a year a day after last CPE but patient states he needs his CPE by 02/14/21 for insurance

## 2020-09-17 ENCOUNTER — Ambulatory Visit: Payer: Self-pay | Attending: Internal Medicine

## 2020-09-17 ENCOUNTER — Other Ambulatory Visit: Payer: Self-pay | Admitting: Internal Medicine

## 2020-09-17 DIAGNOSIS — Z23 Encounter for immunization: Secondary | ICD-10-CM

## 2020-09-17 NOTE — Progress Notes (Signed)
   Covid-19 Vaccination Clinic  Name:  Joel Marshall    MRN: [id number] DOB: 1981/08/07  09/17/2020  [name] was observed post Covid-19 immunization for 15 minutes without incident. He was provided with Vaccine Information Sheet and instruction to access the V-Safe system.   [name] was instructed to call 911 with any severe reactions post vaccine: [name] Kitchen Difficulty breathing  . Swelling of face and throat  . A fast heartbeat  . A bad rash all over body  . Dizziness and weakness   Immunizations Administered    Name Date Dose VIS Date Route   PFIZER Comrnaty(Gray TOP) Covid-19 Vaccine 09/17/2020  1:35 PM 0.3 mL 07/25/2020 Intramuscular   Manufacturer: [employer]   Lot: [id number]   NDC: [phone]

## 2020-09-18 NOTE — Telephone Encounter (Signed)
Faythe Ghee, so since Sam will be out for Maternity Leave. Please schedule pt with another provider to have physical done on time. Thanks

## 2020-09-19 NOTE — Telephone Encounter (Signed)
Patient scheduled with dr.parker

## 2020-10-09 ENCOUNTER — Encounter: Payer: Self-pay | Admitting: Cardiovascular Disease

## 2020-10-09 ENCOUNTER — Other Ambulatory Visit: Payer: Self-pay

## 2020-10-09 ENCOUNTER — Ambulatory Visit (INDEPENDENT_AMBULATORY_CARE_PROVIDER_SITE_OTHER): Payer: 59 | Admitting: Cardiovascular Disease

## 2020-10-09 DIAGNOSIS — E782 Mixed hyperlipidemia: Secondary | ICD-10-CM | POA: Diagnosis not present

## 2020-10-09 DIAGNOSIS — R002 Palpitations: Secondary | ICD-10-CM

## 2020-10-09 DIAGNOSIS — I10 Essential (primary) hypertension: Secondary | ICD-10-CM | POA: Diagnosis not present

## 2020-10-09 DIAGNOSIS — G4733 Obstructive sleep apnea (adult) (pediatric): Secondary | ICD-10-CM
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
## 2020-10-09 NOTE — Assessment & Plan Note (Signed)
History of essential hypertension a blood pressure measured today 119/78.  He is on hydrochlorothiazide and Bystolic.

## 2020-10-09 NOTE — Addendum Note (Signed)
Addended by: Beatrix Fetters on: 10/09/2020 10:02 AM   Modules accepted: Orders

## 2020-10-09 NOTE — Patient Instructions (Signed)
Medication Instructions:  Your physician recommends that you continue on your current medications as directed. Please refer to the Current Medication list given to you today.  *If you need a refill on your cardiac medications before your next appointment, please call your pharmacy*   Lab Work: Your physician recommends that you return for lab work in: 3 months for lipid/liver profile.  If you have labs (blood work) drawn today and your tests are completely normal, you will receive your results only by: [name] Kitchen MyChart Message (if you have MyChart) OR . A paper copy in the mail If you have any lab test that is abnormal or we need to change your treatment, we will call you to review the results.   Follow-Up: At [facility], you and your health needs are our priority.  As part of our continuing mission to provide you with exceptional heart care, we have created designated Provider Care Teams.  These Care Teams include your primary Cardiologist (physician) and Advanced Practice Providers (APPs -  Physician Assistants and Nurse Practitioners) who all work together to provide you with the care you need, when you need it.  We recommend signing up for the patient portal called "MyChart".  Sign up information is provided on this After Visit Summary.  MyChart is used to connect with patients for Virtual Visits (Telemedicine).  Patients are able to view lab/test results, encounter notes, upcoming appointments, etc.  Non-urgent messages can be sent to your provider as well.   To learn more about what you can do with MyChart, go to [email or web address].    Your next appointment:   12 month(s)  The format for your next appointment:   In Person  Provider:   Quay Burow, MD   Other Instructions  Heart-Healthy Eating Plan Heart-healthy meal planning includes:  Eating less unhealthy fats.  Eating more healthy fats.  Making other changes in your diet. Talk with your doctor or a diet  specialist (dietitian) to create an eating plan that is right for you.  What are tips for following this plan? Cooking Avoid frying your food. Try to bake, boil, grill, or broil it instead. You can also reduce fat by:  Removing the skin from poultry.  Removing all visible fats from meats.  Steaming vegetables in water or broth. Meal planning  At meals, divide your plate into four equal parts: ? Fill one-half of your plate with vegetables and green salads. ? Fill one-fourth of your plate with whole grains. ? Fill one-fourth of your plate with lean protein foods.  Eat 4-5 servings of vegetables per day. A serving of vegetables is: ? 1 cup of raw or cooked vegetables. ? 2 cups of raw leafy greens.  Eat 4-5 servings of fruit per day. A serving of fruit is: ? 1 medium whole fruit. ?  cup of dried fruit. ?  cup of fresh, frozen, or canned fruit. ?  cup of 100% fruit juice.  Eat more foods that have soluble fiber. These are apples, broccoli, carrots, beans, peas, and barley. Try to get 20-30 g of fiber per day.  Eat 4-5 servings of nuts, legumes, and seeds per week: ? 1 serving of dried beans or legumes equals  cup after being cooked. ? 1 serving of nuts is  cup. ? 1 serving of seeds equals 1 tablespoon.   General information  Eat more home-cooked food. Eat less restaurant, buffet, and fast food.  Limit or avoid alcohol.  Limit foods that are high in starch  and sugar.  Avoid fried foods.  Lose weight if you are overweight.  Keep track of how much salt (sodium) you eat. This is important if you have high blood pressure. Ask your doctor to tell you more about this.  Try to add vegetarian meals each week. Fats  Choose healthy fats. These include olive oil and canola oil, flaxseeds, walnuts, almonds, and seeds.  Eat more omega-3 fats. These include salmon, mackerel, sardines, tuna, flaxseed oil, and ground flaxseeds. Try to eat fish at least 2 times each week.  Check  food labels. Avoid foods with trans fats or high amounts of saturated fat.  Limit saturated fats. ? These are often found in animal products, such as meats, butter, and cream. ? These are also found in plant foods, such as palm oil, palm kernel oil, and coconut oil.  Avoid foods with partially hydrogenated oils in them. These have trans fats. Examples are stick margarine, some tub margarines, cookies, crackers, and other baked goods. What foods can I eat? Fruits All fresh, canned (in natural juice), or frozen fruits. Vegetables Fresh or frozen vegetables (raw, steamed, roasted, or grilled). Green salads. Grains Most grains. Choose whole wheat and whole grains most of the time. Rice and pasta, including brown rice and pastas made with whole wheat. Meats and other proteins Lean, well-trimmed beef, veal, pork, and lamb. Chicken and [place] without skin. All fish and shellfish. Wild duck, rabbit, pheasant, and venison. Egg whites or low-cholesterol egg substitutes. Dried beans, peas, lentils, and tofu. Seeds and most nuts. Dairy Low-fat or nonfat cheeses, including ricotta and mozzarella. Skim or 1% milk that is liquid, powdered, or evaporated. Buttermilk that is made with low-fat milk. Nonfat or low-fat yogurt. Fats and oils Non-hydrogenated (trans-free) margarines. Vegetable oils, including soybean, sesame, sunflower, olive, peanut, safflower, corn, canola, and cottonseed. Salad dressings or mayonnaise made with a vegetable oil. Beverages Mineral water. Coffee and tea. Diet carbonated beverages. Sweets and desserts Sherbet, gelatin, and fruit ice. Small amounts of dark chocolate. Limit all sweets and desserts. Seasonings and condiments All seasonings and condiments. The items listed above may not be a complete list of foods and drinks you can eat. Contact a dietitian for more options. What foods should I avoid? Fruits Canned fruit in heavy syrup. Fruit in cream or butter sauce. Fried  fruit. Limit coconut. Vegetables Vegetables cooked in cheese, cream, or butter sauce. Fried vegetables. Grains Breads that are made with saturated or trans fats, oils, or whole milk. Croissants. Sweet rolls. Donuts. High-fat crackers, such as cheese crackers. Meats and other proteins Fatty meats, such as hot dogs, ribs, sausage, bacon, rib-eye roast or steak. High-fat deli meats, such as salami and bologna. Caviar. Domestic duck and goose. Organ meats, such as liver. Dairy Cream, sour cream, cream cheese, and creamed cottage cheese. Whole-milk cheeses. Whole or 2% milk that is liquid, evaporated, or condensed. Whole buttermilk. Cream sauce or high-fat cheese sauce. Yogurt that is made from whole milk. Fats and oils Meat fat, or shortening. Cocoa butter, hydrogenated oils, palm oil, coconut oil, palm kernel oil. Solid fats and shortenings, including bacon fat, salt pork, lard, and butter. Nondairy cream substitutes. Salad dressings with cheese or sour cream. Beverages Regular sodas and juice drinks with added sugar. Sweets and desserts Frosting. Pudding. Cookies. Cakes. Pies. Milk chocolate or white chocolate. Buttered syrups. Full-fat ice cream or ice cream drinks. The items listed above may not be a complete list of foods and drinks to avoid. Contact a dietitian for more  information. Summary  Heart-healthy meal planning includes eating less unhealthy fats, eating more healthy fats, and making other changes in your diet.  Eat a balanced diet. This includes fruits and vegetables, low-fat or nonfat dairy, lean protein, nuts and legumes, whole grains, and heart-healthy oils and fats. This information is not intended to replace advice given to you by your health care provider. Make sure you discuss any questions you have with your health care provider. Document Revised: 10/07/2017 Document Reviewed: 09/10/2017 Elsevier Patient Education  2021 [employer].

## 2020-10-09 NOTE — Assessment & Plan Note (Signed)
History of obstructive sleep apnea on BiPAP in the past which she has not used recently and does not feel symptomatic without it.
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
## 2020-10-09 NOTE — Assessment & Plan Note (Signed)
History of hyperlipidemia not on statin therapy with lipid profile performed 01/17/2020 revealing total cholesterol 225, LDL 147 and HDL 43.  I am going to provide him a heart healthy diet and we will recheck a lipid liver profile in 3 months.  If he still elevated we will discuss statin therapy.

## 2020-10-24 ENCOUNTER — Other Ambulatory Visit: Payer: Self-pay | Admitting: Family Medicine

## 2020-10-24 MED FILL — NEBIVOLOL HCL 10 MG TABS: 10 | 90 days supply | Qty: 90 | Fill #0

## 2020-11-08 ENCOUNTER — Other Ambulatory Visit (HOSPITAL_BASED_OUTPATIENT_CLINIC_OR_DEPARTMENT_OTHER): Payer: Self-pay

## 2020-11-12 ENCOUNTER — Other Ambulatory Visit (HOSPITAL_BASED_OUTPATIENT_CLINIC_OR_DEPARTMENT_OTHER): Payer: Self-pay

## 2020-12-22 ENCOUNTER — Other Ambulatory Visit: Payer: Self-pay | Admitting: Cardiovascular Disease

## 2020-12-23 ENCOUNTER — Other Ambulatory Visit (HOSPITAL_COMMUNITY): Payer: Self-pay

## 2020-12-23 MED ORDER — HYDROCHLOROTHIAZIDE 25 MG PO TABS
25.0000 mg | ORAL_TABLET | Freq: Every day | ORAL | 2 refills | Status: DC
  Filled 2020-12-23: qty 90, 90d supply, fill #0
  Filled 2021-04-01: qty 90, 90d supply, fill #1
  Filled 2021-07-06: qty 90, 90d supply, fill #2
  Filled 2021-10-07: qty 90, 90d supply, fill #3

## 2020-12-23 NOTE — Telephone Encounter (Signed)
Rx(s) sent to pharmacy electronically.  

## 2021-01-17 ENCOUNTER — Ambulatory Visit (INDEPENDENT_AMBULATORY_CARE_PROVIDER_SITE_OTHER): Payer: 59 | Admitting: Family Medicine

## 2021-01-17 ENCOUNTER — Encounter: Payer: Self-pay | Admitting: Family Medicine

## 2021-01-17 ENCOUNTER — Other Ambulatory Visit (HOSPITAL_COMMUNITY)
Admission: RE | Admit: 2021-01-17 | Discharge: 2021-01-17 | Disposition: A | Discharge status: Home / Self Care | Payer: 59 | Source: Ambulatory Visit | Attending: Family Medicine | Admitting: Family Medicine

## 2021-01-17 ENCOUNTER — Other Ambulatory Visit: Payer: Self-pay

## 2021-01-17 VITALS — BP 124/84 | HR 66 | Temp 98.0°F | Ht 74.0 in | Wt 266.6 lb

## 2021-01-17 DIAGNOSIS — E782 Mixed hyperlipidemia: Secondary | ICD-10-CM | POA: Diagnosis not present

## 2021-01-17 DIAGNOSIS — Z114 Encounter for screening for human immunodeficiency virus [HIV]: Secondary | ICD-10-CM | POA: Diagnosis not present

## 2021-01-17 DIAGNOSIS — I10 Essential (primary) hypertension: Secondary | ICD-10-CM | POA: Diagnosis not present

## 2021-01-17 DIAGNOSIS — Z1159 Encounter for screening for other viral diseases: Secondary | ICD-10-CM | POA: Diagnosis not present

## 2021-01-17 DIAGNOSIS — E669 Obesity, unspecified: Secondary | ICD-10-CM

## 2021-01-17 DIAGNOSIS — Z6834 Body mass index (BMI) 34.0-34.9, adult: Secondary | ICD-10-CM

## 2021-01-17 DIAGNOSIS — Z0001 Encounter for general adult medical examination with abnormal findings: Secondary | ICD-10-CM

## 2021-01-17 DIAGNOSIS — Z113 Encounter for screening for infections with a predominantly sexual mode of transmission: Secondary | ICD-10-CM

## 2021-01-17 DIAGNOSIS — R69 Illness, unspecified: Secondary | ICD-10-CM | POA: Diagnosis not present

## 2021-01-17 DIAGNOSIS — R7303 Prediabetes: Secondary | ICD-10-CM | POA: Diagnosis not present

## 2021-01-17 LAB — CBC
HCT: 38 % — ABNORMAL LOW (ref 39.0–52.0)
Hemoglobin: 13.2 g/dL (ref 13.0–17.0)
MCHC: 34.7 g/dL (ref 30.0–36.0)
MCV: 84.9 fl (ref 78.0–100.0)
Platelets: 330 10*3/uL (ref 150.0–400.0)
RBC: 4.48 Mil/uL (ref 4.22–5.81)
RDW: 14 % (ref 11.5–15.5)
WBC: 7.9 10*3/uL (ref 4.0–10.5)

## 2021-01-17 LAB — TSH: TSH: 1.68 u[IU]/mL (ref 0.35–4.50)

## 2021-01-17 NOTE — Progress Notes (Signed)
Chief Complaint:  Joel Marshall is a 40 y.o. male who presents today for his annual comprehensive physical exam.    Assessment/Plan:  New/Acute Problems: Hematuria Check UA today.  Reassuring symptoms have not returned.  Headache No red flags.  Possibly due to eyestrain.  Advised him to get eye exam soon.  Recommended he stay well-hydrated and work on getting adequate amounts of sleep-he is only getting around 6 hours nightly.  Chronic Problems Addressed Today: Hyperlipidemia Check lipids.  Pre-diabetes Check A1c.  Essential hypertension Check labs today.  At goal on HCTZ 25 mg daily and Bystolic 10 mg daily.  Body mass index is 34.23 kg/m. / Obese  BMI Metric Follow Up - 01/17/21 0835      BMI Metric Follow Up-Please document annually   BMI Metric Follow Up Education provided           Preventative Healthcare: Check labs.  We will screen for STDs per patient request.  Patient Counseling(The following topics were reviewed and/or handout was given):  -Nutrition: Stressed importance of moderation in sodium/caffeine intake, saturated fat and cholesterol, caloric balance, sufficient intake of fresh fruits, vegetables, and fiber.  -Stressed the importance of regular exercise.   -Substance Abuse: Discussed cessation/primary prevention of tobacco, alcohol, or other drug use; driving or other dangerous activities under the influence; availability of treatment for abuse.   -Injury prevention: Discussed safety belts, safety helmets, smoke detector, smoking near bedding or upholstery.   -Sexuality: Discussed sexually transmitted diseases, partner selection, use of condoms, avoidance of unintended pregnancy and contraceptive alternatives.   -Dental health: Discussed importance of regular tooth brushing, flossing, and dental visits.  -Health maintenance and immunizations reviewed. Please refer to Health maintenance section.  Return to care in 1 year for next preventative visit.      Subjective:  HPI:  He had an episode of hematuria 5 days ago.  Thinks that he may have passed kidney stone.  Had some pain associated with this.  Symptoms have not returned.  Has had kidney stones in the past.  Still has continuing issues with headaches.  No reported weakness or numbness.  Symptoms have been ongoing for years.  Lifestyle Diet: None specific.  Exercise: Has been doing a lot of walking.   Depression screen PHQ 2/9 01/17/2021  Decreased Interest 0  Down, Depressed, Hopeless 0  PHQ - 2 Score 0  Altered sleeping -  Tired, decreased energy -  Change in appetite -  Feeling bad or failure about yourself  -  Trouble concentrating -  Moving slowly or fidgety/restless -  Suicidal thoughts -  PHQ-9 Score -  Difficult doing work/chores -    Health Maintenance Due  Topic Date Due  . Hepatitis C Screening  Never done     ROS: Per HPI, otherwise a complete review of systems was negative.   PMH:  The following were reviewed and entered/updated in epic: Past Medical History:  Diagnosis Date  . Borderline diabetes   . Breast mass 10/15/2016   behind nipple; R  . History of intestine removal   . Panic attacks   . Sleep apnea    had used cpap but has not used it in several months   Patient Active Problem List   Diagnosis Date Noted  . Hidrocystoma of eyelid 09/15/2019  . Hyperlipidemia 10/20/2018  . Pre-diabetes 05/05/2017  . Class 1 obesity due to excess calories with body mass index (BMI) of 33.0 to 33.9 in adult 11/09/2016  . Essential hypertension  08/19/2016  . Palpitations 08/19/2016  . Obstructive sleep apnea 08/19/2016   Past Surgical History:  Procedure Laterality Date  . CYST EXCISION Bilateral 09/07/2019   Procedure: Excision of bilateral lower eye lid cysts;  Surgeon: Wallace Going, DO;  Location: [facility];  Service: Plastics;  Laterality: Bilateral;  . SMALL INTESTINE SURGERY  1983   "infected" intestine    Family History   Problem Relation Age of Onset  . Rheum arthritis Mother   . Hypertension Father   . Breast cancer Other        cousin  . Colon cancer Other 57       cousin  . HIV Brother     Medications- reviewed and updated Current Outpatient Medications  Medication Sig Dispense Refill  . hydrochlorothiazide (HYDRODIURIL) 25 MG tablet Take 1 tablet (25 mg total) by mouth daily. 180 tablet 2  . nebivolol (BYSTOLIC) 10 MG tablet TAKE 1 TABLET BY MOUTH ONCE A DAY 90 tablet 0  . Cholecalciferol (VITAMIN D3) 50 MCG (2000 UT) capsule Take 2,000 Units by mouth daily. (Patient not taking: Reported on 01/17/2021)     No current facility-administered medications for this visit.    Allergies-reviewed and updated No Known Allergies  Social History   Socioeconomic History  . Marital status: Married    Spouse name: Not on file  . Number of children: 1  . Years of education: Not on file  . Highest education level: Not on file  Occupational History  . Occupation: Actuary  Tobacco Use  . Smoking status: Never Smoker  . Smokeless tobacco: Never Used  Vaping Use  . Vaping Use: Never used  Substance and Sexual Activity  . Alcohol use: No  . Drug use: No  . Sexual activity: Never  Other Topics Concern  . 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 Wt Readings from Last 3 Encounters:  01/17/21 266 lb 9.6 oz (120.9 kg)  10/09/20 259 lb 12.8 oz (117.8 kg)  01/17/20 264 lb  (119.7 kg)  Gen: NAD, resting comfortably HEENT: TMs normal bilaterally. OP clear. No thyromegaly noted.  CV: RRR with no murmurs appreciated Pulm: NWOB, CTAB with no crackles, wheezes, or rhonchi GI: Normal bowel sounds present. Soft, Nontender, Nondistended. MSK: no edema, cyanosis, or clubbing noted Skin: warm, dry Neuro: CN2-12 grossly intact. Strength 5/5 in upper and lower extremities. Reflexes symmetric and intact bilaterally.  Psych: Normal affect and thought content     Cherrill Scrima M. Jerline Pain, MD 01/17/2021 8:37 AM

## 2021-01-17 NOTE — Assessment & Plan Note (Signed)
Check A1c. 

## 2021-01-17 NOTE — Patient Instructions (Signed)
It was very nice to see you today!  We will check blood work today and a urine sample.  Please get your eyes examined soon.  Please come back in a year or so for your next physical.  Come back to see Joel Marshall sooner if needed.  Take care, Dr Jerline Pain  PLEASE NOTE:  If you had any lab tests please let Joel Marshall know if you have not heard back within a few days. You may see your results on mychart before we have a chance to review them but we will give you a call once they are reviewed by Joel Marshall. If we ordered any referrals today, please let Joel Marshall know if you have not heard from their office within the next week.   Please try these tips to maintain a healthy lifestyle:   Eat at least 3 REAL meals and 1-2 snacks per day.  Aim for no more than 5 hours between eating.  If you eat breakfast, please do so within one hour of getting up.    Each meal should contain half fruits/vegetables, one quarter protein, and one quarter carbs (no bigger than a computer mouse)   Cut down on sweet beverages. This includes juice, soda, and sweet tea.     Drink at least 1 glass of water with each meal and aim for at least 8 glasses per day  Exercise at least 15 Preventive Care 87-40 Years Old, Male Preventive care refers to lifestyle choices and visits with your health care provider that can promote health and wellness. This includes: A yearly physical exam. This is also called an annual wellness visit. Regular dental and eye exams. Immunizations. Screening for certain conditions. Healthy lifestyle choices, such as: Eating a healthy diet. Getting regular exercise. Not using drugs or products that contain nicotine and tobacco. Limiting alcohol use. What can I expect for my preventive care visit? Physical exam Your health care provider will check your: Height and weight. These may be used to calculate your BMI (body mass index). BMI is a measurement that tells if you are at a healthy weight. Heart rate and blood  pressure. Body temperature. Skin for abnormal spots. Counseling Your health care provider may ask you questions about your: Past medical problems. Family's medical history. Alcohol, tobacco, and drug use. Emotional well-being. Home life and relationship well-being. Sexual activity. Diet, exercise, and sleep habits. Work and work Statistician. Access to firearms. What immunizations do I need? Vaccines are usually given at various ages, according to a schedule. Your health care provider will recommend vaccines for you based on your age, medical history, and lifestyle or other factors, such as travel or where you work.   What tests do I need? Blood tests Lipid and cholesterol levels. These may be checked every 5 years, or more often if you are over 12 years old. Hepatitis C test. Hepatitis B test. Screening Lung cancer screening. You may have this screening every year starting at age 18 if you have a 30-pack-year history of smoking and currently smoke or have quit within the past 15 years. Prostate cancer screening. Recommendations will vary depending on your family history and other risks. Genital exam to check for testicular cancer or hernias. Colorectal cancer screening. All adults should have this screening starting at age 35 and continuing until age 41. Your health care provider may recommend screening at age 84 if you are at increased risk. You will have tests every 1-10 years, depending on your results and the type of screening test.  Diabetes screening. This is done by checking your blood sugar (glucose) after you have not eaten for a while (fasting). You may have this done every 1-3 years. STD (sexually transmitted disease) testing, if you are at risk. Follow these instructions at home: Eating and drinking Eat a diet that includes fresh fruits and vegetables, whole grains, lean protein, and low-fat dairy products. Take vitamin and mineral supplements as recommended by your  health care provider. Do not drink alcohol if your health care provider tells you not to drink. If you drink alcohol: Limit how much you have to 0-2 drinks a day. Be aware of how much alcohol is in your drink. In the [place] one drink equals one 12 oz bottle of beer (355 mL), one 5 oz glass of wine (148 mL), or one 1 oz glass of hard liquor (44 mL).   Lifestyle Take daily care of your teeth and gums. Brush your teeth every morning and night with fluoride toothpaste. Floss one time each day. Stay active. Exercise for at least 30 minutes 5 or more days each week. Do not use any products that contain nicotine or tobacco, such as cigarettes, e-cigarettes, and chewing tobacco. If you need help quitting, ask your health care provider. Do not use drugs. If you are sexually active, practice safe sex. Use a condom or other form of protection to prevent STIs (sexually transmitted infections). If told by your health care provider, take low-dose aspirin daily starting at age 2. Find healthy ways to cope with stress, such as: Meditation, yoga, or listening to music. Journaling. Talking to a trusted person. Spending time with friends and family. Safety Always wear your seat belt while driving or riding in a vehicle. Do not drive: If you have been drinking alcohol. Do not ride with someone who has been drinking. When you are tired or distracted. While texting. Wear a helmet and other protective equipment during sports activities. If you have firearms in your house, make sure you follow all gun safety procedures. What's next? Go to your health care provider once a year for an annual wellness visit. Ask your health care provider how often you should have your eyes and teeth checked. Stay up to date on all vaccines. This information is not intended to replace advice given to you by your health care provider. Make sure you discuss any questions you have with your health care provider. Document Revised:  05/02/2019 Document Reviewed: 07/28/2018 Elsevier Patient Education  [place].  0 minutes every week.

## 2021-01-17 NOTE — Assessment & Plan Note (Signed)
Check labs today.  At goal on HCTZ 25 mg daily and Bystolic 10 mg daily.

## 2021-01-17 NOTE — Assessment & Plan Note (Signed)
Check lipids 

## 2021-01-20 ENCOUNTER — Encounter: Payer: 59 | Admitting: Physician Assistant

## 2021-01-20 LAB — COMPREHENSIVE METABOLIC PANEL
ALT: 17 U/L (ref 0–53)
AST: 25 U/L (ref 0–37)
Albumin: 4.6 g/dL (ref 3.5–5.2)
Alkaline Phosphatase: 40 U/L (ref 39–117)
BUN: 17 mg/dL (ref 6–23)
CO2: 30 mEq/L (ref 19–32)
Calcium: 9.7 mg/dL (ref 8.4–10.5)
Chloride: 97 mEq/L (ref 96–112)
Creatinine, Ser: 1.27 mg/dL (ref 0.40–1.50)
GFR: 70.75 mL/min (ref >60.00)
Glucose, Bld: 90 mg/dL (ref 70–99)
Potassium: 3.8 mEq/L (ref 3.5–5.1)
Sodium: 137 mEq/L (ref 135–145)
Total Bilirubin: 0.6 mg/dL (ref 0.2–1.2)
Total Protein: 8.1 g/dL (ref 6.0–8.3)

## 2021-01-20 LAB — LIPID PANEL
Cholesterol: 209 mg/dL — ABNORMAL HIGH (ref 0–200)
HDL: 44.9 mg/dL (ref >39.00)
LDL Cholesterol: 136 mg/dL — ABNORMAL HIGH (ref 0–99)
NonHDL: 163.84
Total CHOL/HDL Ratio: 5
Triglycerides: 138 mg/dL (ref 0.0–149.0)
VLDL: 27.6 mg/dL (ref 0.0–40.0)

## 2021-01-20 LAB — HEPATITIS C ANTIBODY
Hepatitis C Ab: NONREACTIVE
SIGNAL TO CUT-OFF: 0.01 (ref <1.00)

## 2021-01-20 LAB — RPR: RPR Ser Ql: NONREACTIVE

## 2021-01-20 LAB — URINE CYTOLOGY ANCILLARY ONLY
Chlamydia: NEGATIVE
Comment: NEGATIVE
Comment: NORMAL
Neisseria Gonorrhea: NEGATIVE

## 2021-01-20 LAB — HIV ANTIBODY (ROUTINE TESTING W REFLEX): HIV 1&2 Ab, 4th Generation: NONREACTIVE

## 2021-01-21 ENCOUNTER — Encounter: Payer: 59 | Admitting: Family Medicine

## 2021-01-21 NOTE — Progress Notes (Signed)
Please inform patient of the following:  Cholesterol is borderline but better than last year.  Everything else is stable.  Do not need to make any changes to medications.  She continue working on diet and exercise and we can recheck in year.

## 2021-02-13 ENCOUNTER — Other Ambulatory Visit: Payer: Self-pay | Admitting: Family Medicine

## 2021-02-14 ENCOUNTER — Other Ambulatory Visit (HOSPITAL_COMMUNITY): Payer: Self-pay

## 2021-02-14 MED ORDER — NEBIVOLOL HCL 10 MG PO TABS
10.0000 mg | ORAL_TABLET | Freq: Every day | ORAL | 1 refills | Status: DC
  Filled 2021-02-14: qty 90, 90d supply, fill #0
  Filled 2021-05-22: qty 90, 90d supply, fill #1

## 2021-04-02 ENCOUNTER — Other Ambulatory Visit (HOSPITAL_COMMUNITY): Payer: Self-pay

## 2021-04-10 ENCOUNTER — Other Ambulatory Visit: Payer: Self-pay

## 2021-04-10 ENCOUNTER — Ambulatory Visit: Payer: 59

## 2021-04-10 VITALS — BP 140/88 | HR 72

## 2021-04-10 DIAGNOSIS — Z0130 Encounter for examination of blood pressure without abnormal findings: Secondary | ICD-10-CM

## 2021-04-10 NOTE — Progress Notes (Signed)
Walked to clinic from [place] took stairs & phone call   AMD

## 2021-05-23 ENCOUNTER — Other Ambulatory Visit (HOSPITAL_COMMUNITY): Payer: Self-pay

## 2021-07-07 ENCOUNTER — Other Ambulatory Visit (HOSPITAL_COMMUNITY): Payer: Self-pay

## 2021-07-20 IMAGING — DX DG ANKLE COMPLETE 3+V*L*
3 series · 3 of 3 positions shown · non-contrast
Comparison: None.

CLINICAL DATA: Pain

EXAM:
LEFT ANKLE COMPLETE - 3+ VIEW

[ankle ap]
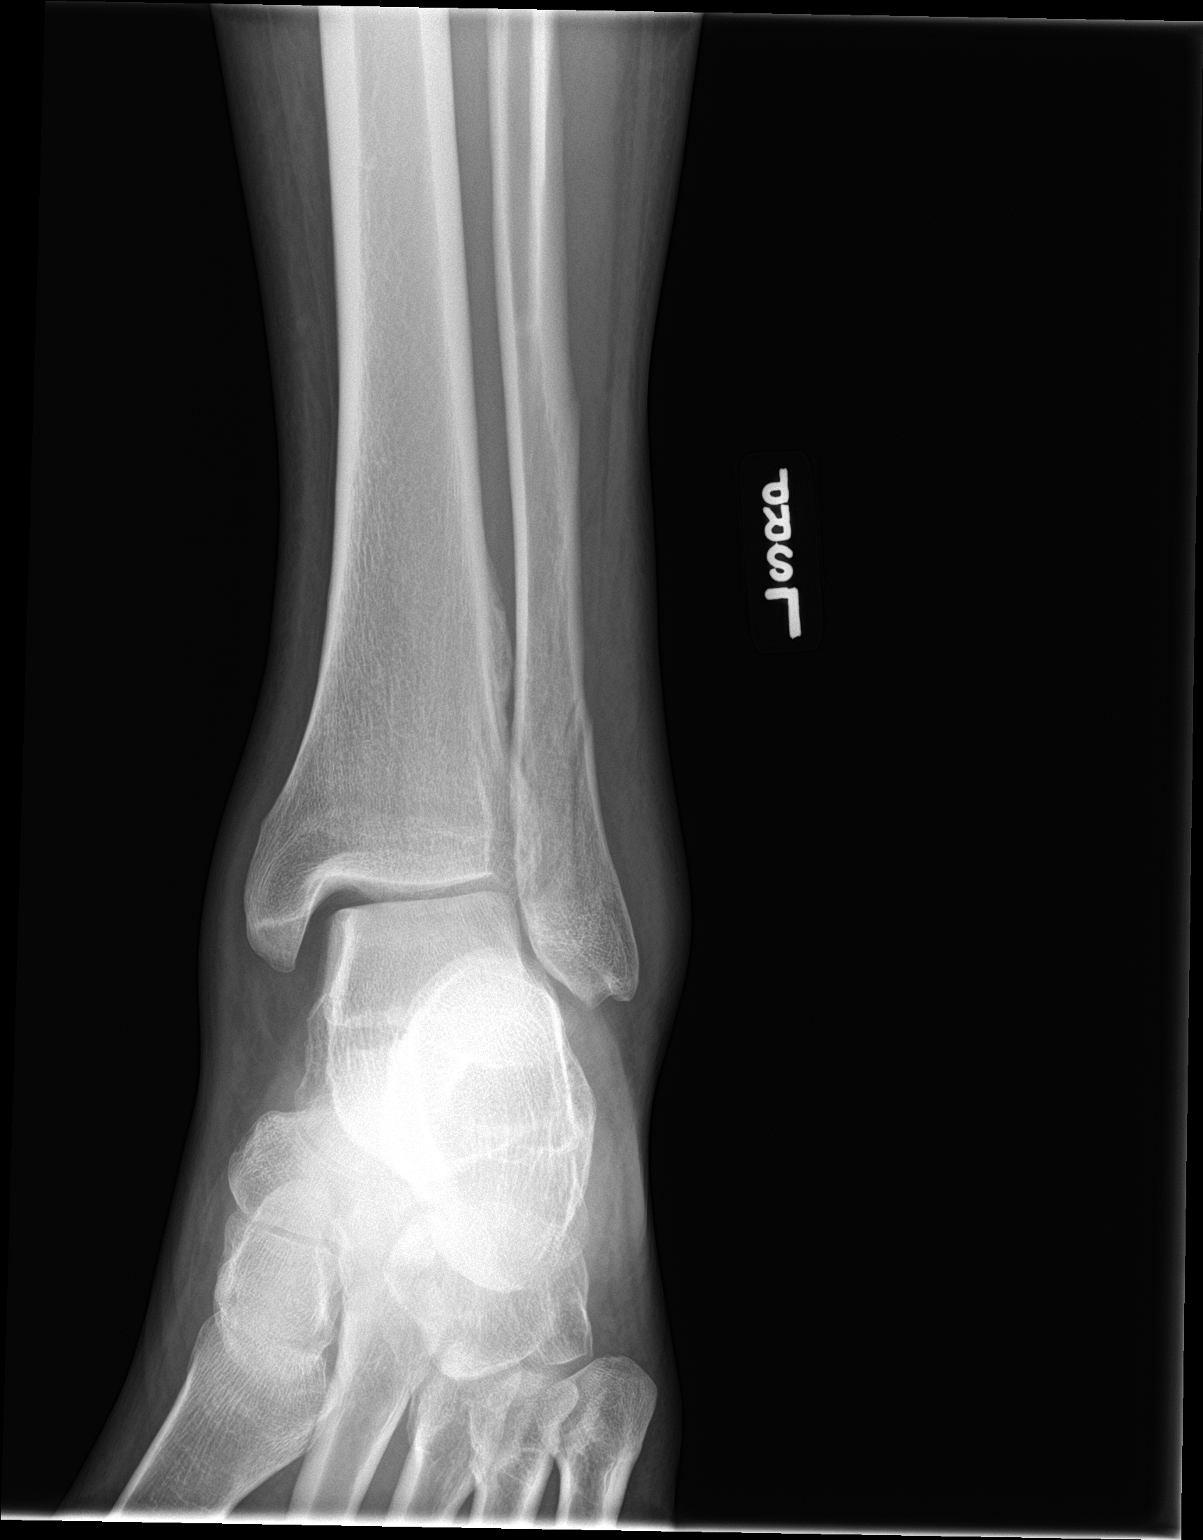

[ankle obl]
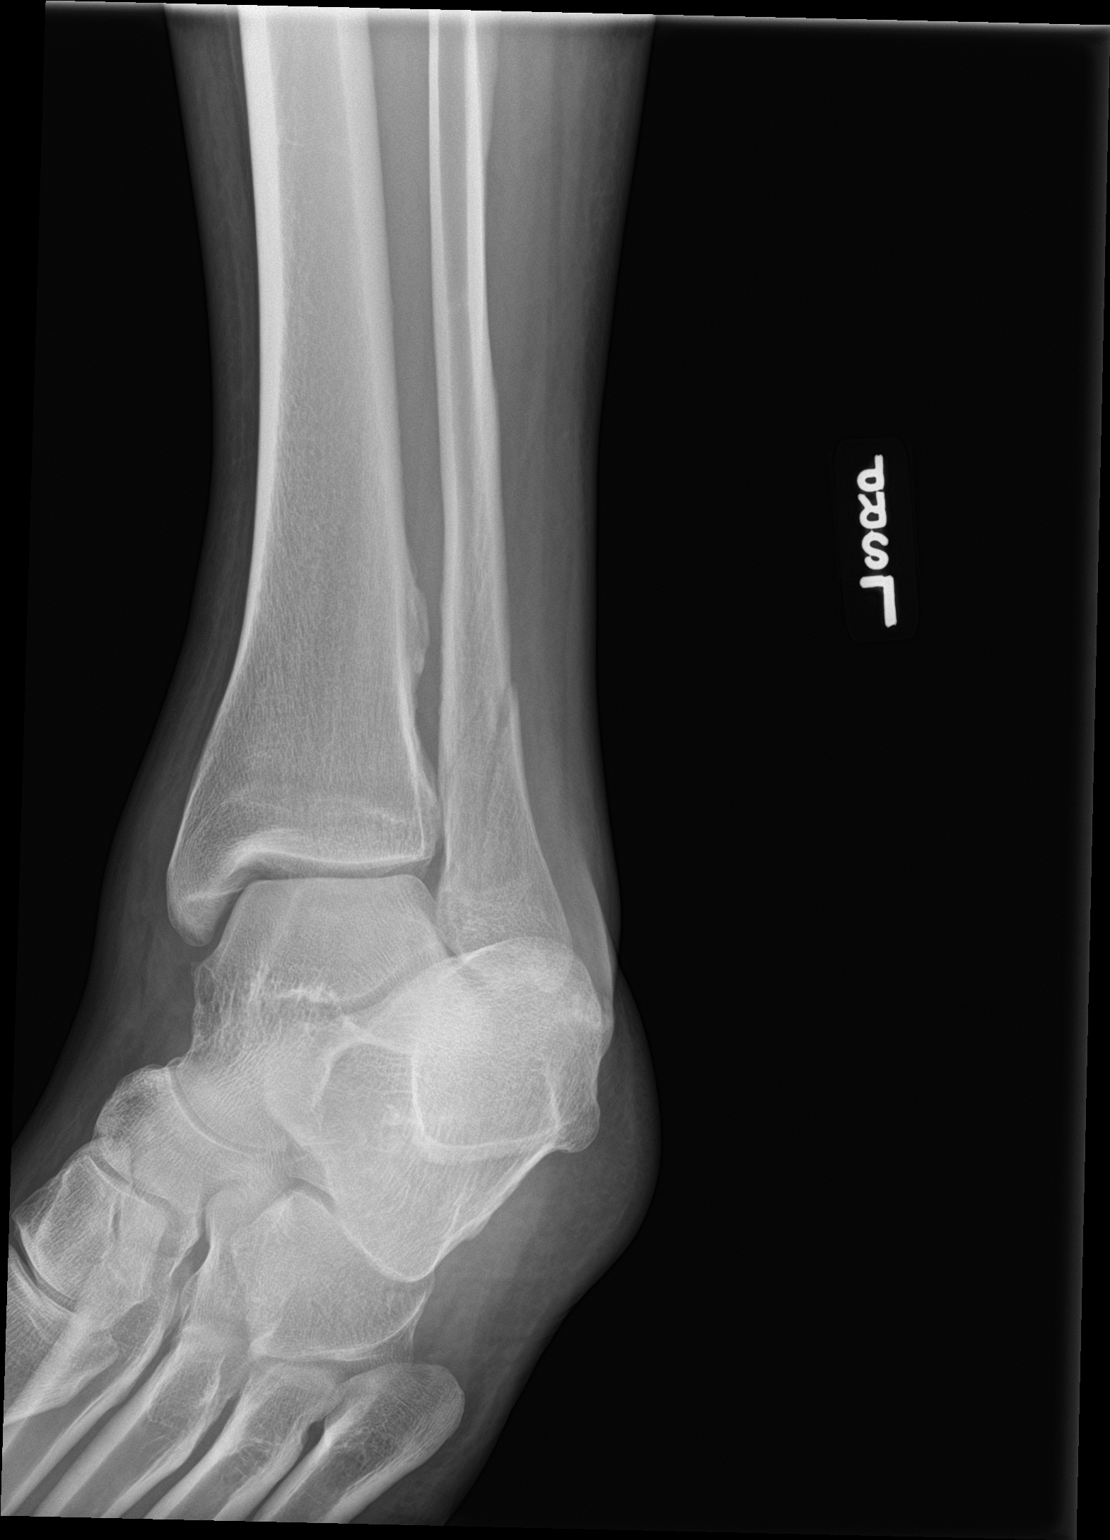

[ankle lat]
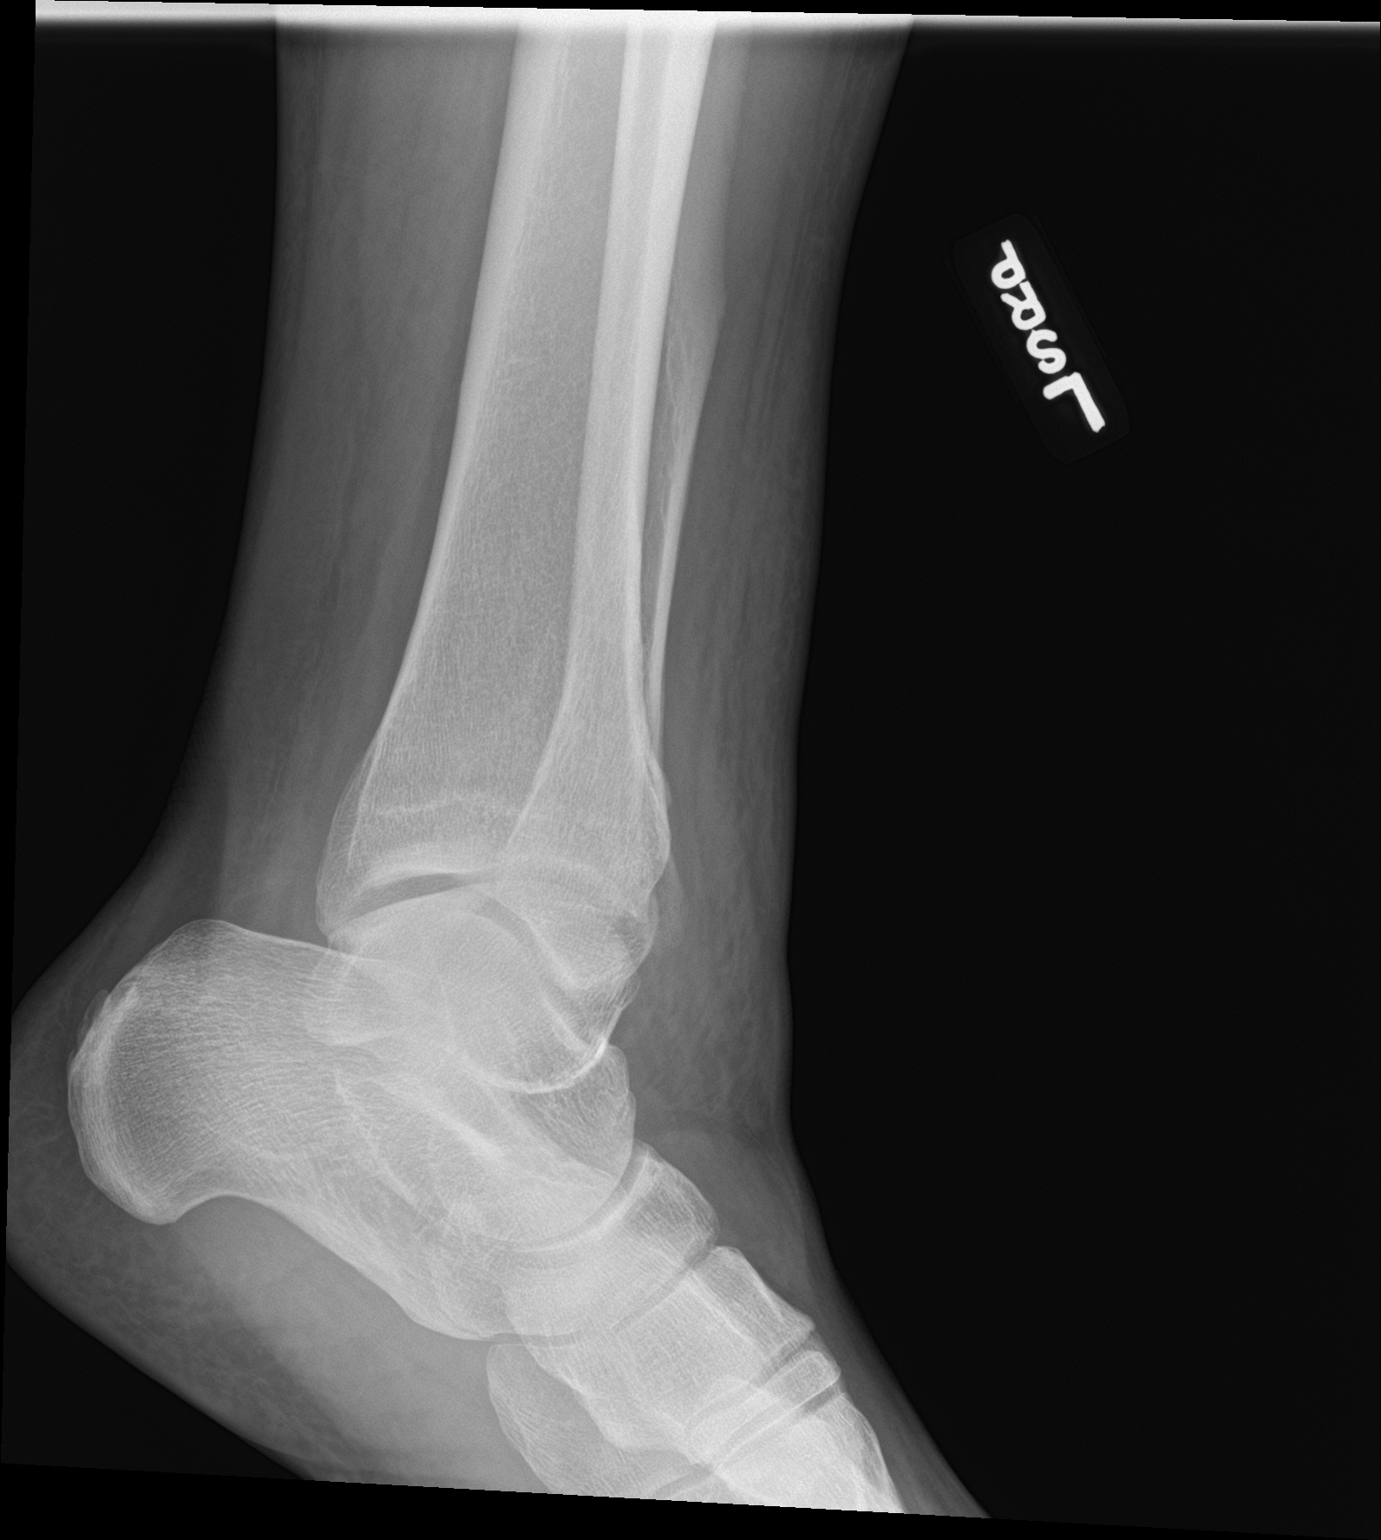

[3 of 3 positions shown; findings below may reference images not displayed]

FINDINGS: There is an acute fracture of the distal fibula/lateral malleolus.
There is some mild widening of the medial clear space. There is
extensive surrounding soft tissue swelling.
IMPRESSION: Acute fracture of the distal fibula with mild widening of the medial
clear space.

## 2021-08-25 ENCOUNTER — Other Ambulatory Visit: Payer: Self-pay | Admitting: Family Medicine

## 2021-08-26 ENCOUNTER — Other Ambulatory Visit (HOSPITAL_COMMUNITY): Payer: Self-pay

## 2021-08-26 MED ORDER — NEBIVOLOL HCL 10 MG PO TABS
10.0000 mg | ORAL_TABLET | Freq: Every day | ORAL | 1 refills | Status: DC
  Filled 2021-08-26: qty 90, 90d supply, fill #0
  Filled 2021-11-26: qty 90, 90d supply, fill #1

## 2021-10-08 ENCOUNTER — Other Ambulatory Visit (HOSPITAL_COMMUNITY): Payer: Self-pay

## 2021-11-03 ENCOUNTER — Ambulatory Visit (HOSPITAL_COMMUNITY)
Admission: RE | Admit: 2021-11-03 | Discharge: 2021-11-03 | Disposition: A | Discharge status: Home / Self Care | Payer: 59 | Source: Ambulatory Visit | Attending: Physician Assistant | Admitting: Physician Assistant

## 2021-11-03 ENCOUNTER — Other Ambulatory Visit: Payer: Self-pay

## 2021-11-03 ENCOUNTER — Ambulatory Visit (INDEPENDENT_AMBULATORY_CARE_PROVIDER_SITE_OTHER): Payer: 59 | Admitting: Physician Assistant

## 2021-11-03 ENCOUNTER — Encounter: Payer: Self-pay | Admitting: Physician Assistant

## 2021-11-03 VITALS — BP 140/80 | HR 78 | Temp 98.0°F | Ht 74.0 in | Wt 271.2 lb

## 2021-11-03 DIAGNOSIS — R519 Headache, unspecified: Secondary | ICD-10-CM | POA: Diagnosis not present

## 2021-11-03 NOTE — Progress Notes (Signed)
Joel Marshall is a 41 y.o. male here for a recurrence of a previously resolved problem. ? ?History of Present Illness:  ? ?Chief Complaint  ?Patient presents with  ?? Headache  ?  Pt stated that he has been experiencing some headaches for the past couple of days but the last one was unbareable.Taking 2 Excedrine for pain  ? ? ?HPI ? ?Headaches ?Joel Marshall presents with recent severe headache. Joel Marshall previously discussed this problem with Dr. Jerline Pain on 01/17/21 during his annual physical. At that time, he was only getting about 6 hours of sleep and hadn't had a formal eye exam in a while. Following the completion of his PE, there were no abnormalities found. This alone led to the possibility that he was suffering from eye strain. As a result he was recommended to work on increasing nightly sleep amounts, hydrating frequently, and advised to get an eye exam completed. He has not gotten a formal exam. He still is getting limited sleep due to having two young children. Some days he gets a good amount of water, other days he days he doesn't. ? ?What concerns him is that 2-3 weeks ago he had a dull headache on Saturday and it gradually increased to severe. Took two excedrin and eventually fell asleep. He slept for 4-6 hours. When he awoke felt almost 100% besides. This was the worst HA of life. This was at the beginning of March. Has a slight, dull HA at this time. ? ?Denies hx of migraines, double vision, weakness, n/v, slurred speech, confusion. ? ?Past Medical History:  ?Diagnosis Date  ?? Borderline diabetes   ?? Breast mass 10/15/2016  ? behind nipple; R  ?? History of intestine removal   ?? Panic attacks   ?? Sleep apnea   ? had used cpap but has not used it in several months  ? ?  ?Social History  ? ?Tobacco Use  ?? Smoking status: Never  ?? Smokeless tobacco: Never  ?Vaping Use  ?? Vaping Use: Never used  ?Substance Use Topics  ?? Alcohol use: No  ?? Drug use: No  ? ? ?Past Surgical History:  ?Procedure Laterality Date  ??  CYST EXCISION Bilateral 09/07/2019  ? Procedure: Excision of bilateral lower eye lid cysts;  Surgeon: Wallace Going, DO;  Location: [facility];  Service: Plastics;  Laterality: Bilateral;  ?? SMALL INTESTINE SURGERY  1983  ? "infected" intestine  ? ? ?Family History  ?Problem Relation Age of Onset  ?? Rheum arthritis Mother   ?? Hypertension Father   ?? Breast cancer Other   ?     cousin  ?? Colon cancer Other 33  ?     cousin  ?? HIV Brother   ? ? ?No Known Allergies ? ?Current Medications:  ? ?Current Outpatient Medications:  ??  Cholecalciferol (VITAMIN D3) 50 MCG (2000 UT) capsule, Take 2,000 Units by mouth daily., Disp: , Rfl:  ??  hydrochlorothiazide (HYDRODIURIL) 25 MG tablet, Take 1 tablet (25 mg total) by mouth daily., Disp: 180 tablet, Rfl: 2 ??  nebivolol (BYSTOLIC) 10 MG tablet, Take 1 tablet (10 mg total) by mouth daily., Disp: 90 tablet, Rfl: 1  ? ?Review of Systems:  ? ?ROS ?Negative unless otherwise specified per HPI. ? ?Vitals:  ? ?Vitals:  ? 11/03/21 1455  ?BP: 140/80  ?Pulse: 78  ?Temp: 98 ?F (36.7 ?C)  ?SpO2: 96%  ?Weight: 271 lb 3.2 oz (123 kg)  ?Height: '6\' 2"'$  (1.88 m)  ?   ?  Body mass index is 34.82 kg/m?. ? ?Physical Exam:  ? ?Physical Exam ?Vitals and nursing note reviewed.  ?Constitutional:   ?   General: He is not in acute distress. ?   Appearance: He is well-developed. He is not ill-appearing or toxic-appearing.  ?Cardiovascular:  ?   Rate and Rhythm: Normal rate and regular rhythm.  ?   Pulses: Normal pulses.  ?   Heart sounds: Normal heart sounds, S1 normal and S2 normal.  ?Pulmonary:  ?   Effort: Pulmonary effort is normal.  ?   Breath sounds: Normal breath sounds.  ?Skin: ?   General: Skin is warm and dry.  ?Neurological:  ?   General: No focal deficit present.  ?   Mental Status: He is alert.  ?   GCS: GCS eye subscore is 4. GCS verbal subscore is 5. GCS motor subscore is 6.  ?   Cranial Nerves: Cranial nerves 2-12 are intact.  ?   Sensory: Sensation is intact.  ?    Motor: Motor function is intact.  ?   Coordination: Coordination is intact.  ?   Gait: Gait is intact.  ?Psychiatric:     ?   Speech: Speech normal.     ?   Behavior: Behavior normal. Behavior is cooperative.  ? ? ?Assessment and Plan:  ? ?Nonintractable headache, unspecified chronicity pattern, unspecified headache type ?Neuro exam is WNL ?Due to recent worst HA of life, patient is requesting MRI -- this is not unreasonable request ?Imaging ordered for [facility] tonight at 9p ?If new/worsening symptoms in the meantime, needs to go to the ER ?Also encouraged patient to get an updated eye exam ?If sx persist or abnormal MRI findings, may need neuro referral ? ?I,Havlyn C Ratchford,acting as a scribe for [employer], PA.,have documented all relevant documentation on the behalf of Inda Coke, PA,as directed by  Inda Coke, PA while in the presence of Inda Coke, [place]. ? ?IInda Coke, PA, have reviewed all documentation for this visit. The documentation on 11/03/21 for the exam, diagnosis, procedures, and orders are all accurate and complete. ? ? ?Inda Coke, PA-C ? ?

## 2021-11-03 NOTE — Patient Instructions (Signed)
It was great to see you! ? ?We are going to order a stat brain MRI for your headaches ? ?Get help right away if: ?Your headache gets very bad quickly. ?Your headache gets worse after a lot of physical activity. ?You keep throwing up. ?You have a stiff neck. ?You have trouble seeing. ?You have trouble speaking. ?You have pain in the eye or ear. ?Your muscles are weak or you lose muscle control. ?You lose your balance or have trouble walking. ?You feel like you will pass out (faint) or you pass out. ?You are mixed up (confused). ?You have a seizure.  ? ?Take care, ? ?Inda Coke PA-C  ?

## 2021-11-04 ENCOUNTER — Other Ambulatory Visit: Payer: Self-pay | Admitting: Physician Assistant

## 2021-11-04 ENCOUNTER — Telehealth: Payer: Self-pay | Admitting: Physician Assistant

## 2021-11-04 ENCOUNTER — Telehealth: Payer: Self-pay

## 2021-11-04 DIAGNOSIS — G9389 Other specified disorders of brain: Secondary | ICD-10-CM

## 2021-11-04 DIAGNOSIS — R519 Headache, unspecified: Secondary | ICD-10-CM

## 2021-11-04 NOTE — Telephone Encounter (Signed)
I have spoken with pt regarding MRI results and recommendations. ?

## 2021-11-04 NOTE — Telephone Encounter (Signed)
I have already spoken with pt. ?

## 2021-11-04 NOTE — Telephone Encounter (Signed)
Pt is asking for a call regarding his MRI results. ?

## 2021-11-05 ENCOUNTER — Other Ambulatory Visit: Payer: Self-pay

## 2021-11-05 ENCOUNTER — Ambulatory Visit (HOSPITAL_COMMUNITY)
Admission: AD | Admit: 2021-11-05 | Discharge: 2021-11-05 | Disposition: A | Discharge status: Home / Self Care | Payer: 59 | Source: Other Acute Inpatient Hospital | Attending: Student in an Organized Health Care Education/Training Program | Admitting: Student in an Organized Health Care Education/Training Program

## 2021-11-05 ENCOUNTER — Emergency Department: Payer: 59

## 2021-11-05 ENCOUNTER — Emergency Department
Admission: EM | Admit: 2021-11-05 | Discharge: 2021-11-05 | Disposition: A | Discharge status: Other Acute Inpatient Hospital | Payer: 59 | Attending: Student in an Organized Health Care Education/Training Program | Admitting: Student in an Organized Health Care Education/Training Program

## 2021-11-05 ENCOUNTER — Telehealth: Payer: Self-pay | Admitting: Physician Assistant

## 2021-11-05 ENCOUNTER — Encounter: Payer: Self-pay | Admitting: Emergency Medicine

## 2021-11-05 DIAGNOSIS — C719 Malignant neoplasm of brain, unspecified: Secondary | ICD-10-CM | POA: Insufficient documentation

## 2021-11-05 DIAGNOSIS — D352 Benign neoplasm of pituitary gland: Secondary | ICD-10-CM

## 2021-11-05 DIAGNOSIS — E876 Hypokalemia: Secondary | ICD-10-CM | POA: Insufficient documentation

## 2021-11-05 DIAGNOSIS — C751 Malignant neoplasm of pituitary gland: Secondary | ICD-10-CM | POA: Diagnosis not present

## 2021-11-05 DIAGNOSIS — E878 Other disorders of electrolyte and fluid balance, not elsewhere classified: Secondary | ICD-10-CM | POA: Insufficient documentation

## 2021-11-05 DIAGNOSIS — H532 Diplopia: Secondary | ICD-10-CM | POA: Diagnosis not present

## 2021-11-05 DIAGNOSIS — R519 Headache, unspecified: Secondary | ICD-10-CM | POA: Diagnosis not present

## 2021-11-05 DIAGNOSIS — I10 Essential (primary) hypertension: Secondary | ICD-10-CM | POA: Diagnosis not present

## 2021-11-05 LAB — COMPREHENSIVE METABOLIC PANEL
ALT: 19 U/L (ref 0–44)
AST: 24 U/L (ref 15–41)
Albumin: 4.2 g/dL (ref 3.5–5.0)
Alkaline Phosphatase: 43 U/L (ref 38–126)
Anion gap: 9 (ref 5–15)
BUN: 15 mg/dL (ref 6–20)
CO2: 31 mmol/L (ref 22–32)
Calcium: 9.4 mg/dL (ref 8.9–10.3)
Chloride: 97 mmol/L — ABNORMAL LOW (ref 98–111)
Creatinine, Ser: 1.15 mg/dL (ref 0.61–1.24)
GFR, Estimated: >60 mL/min (ref >60)
Glucose, Bld: 89 mg/dL (ref 70–99)
Potassium: 3.2 mmol/L — ABNORMAL LOW (ref 3.5–5.1)
Sodium: 137 mmol/L (ref 135–145)
Total Bilirubin: 0.8 mg/dL (ref 0.3–1.2)
Total Protein: 8 g/dL (ref 6.5–8.1)

## 2021-11-05 LAB — CBC
HCT: 40 % (ref 39.0–52.0)
Hemoglobin: 13.4 g/dL (ref 13.0–17.0)
MCH: 28.6 pg (ref 26.0–34.0)
MCHC: 33.5 g/dL (ref 30.0–36.0)
MCV: 85.5 fL (ref 80.0–100.0)
Platelets: 338 10*3/uL (ref 150–400)
RBC: 4.68 MIL/uL (ref 4.22–5.81)
RDW: 12.9 % (ref 11.5–15.5)
WBC: 7.4 10*3/uL (ref 4.0–10.5)
nRBC: 0 % (ref 0.0–0.2)

## 2021-11-05 MED ORDER — DIPHENHYDRAMINE HCL 50 MG/ML IJ SOLN
12.5000 mg | Freq: Once | INTRAMUSCULAR | Status: AC
  Administered 2021-11-05: 12.5 mg via INTRAVENOUS
  Filled 2021-11-05: qty 1

## 2021-11-05 MED ORDER — PROCHLORPERAZINE EDISYLATE 10 MG/2ML IJ SOLN
10.0000 mg | Freq: Once | INTRAMUSCULAR | Status: AC
  Administered 2021-11-05: 10 mg via INTRAVENOUS
  Filled 2021-11-05: qty 2

## 2021-11-05 NOTE — ED Notes (Signed)
Patient placed on monitoring leads, b/p cuff and pulse ox. Patient ambulated to and from hallway bathroom with a steady gait. ?

## 2021-11-05 NOTE — ED Notes (Signed)
XRAY  POWERSHARE  WITH  [facility] 

## 2021-11-05 NOTE — ED Notes (Signed)
Report given to Martinique Keck RN, Camera operator, at  [facility] ED. ?

## 2021-11-05 NOTE — ED Provider Notes (Signed)
? ?[facility] ?Provider Note ? ? ? Event Date/Time  ? First MD Initiated Contact with Patient 11/05/21 1157   ?  (approximate) ? ? ?History  ? ?Headache ? ? ?HPI ? ?Joel Marshall is a 41 y.o. male presents to the ER for evaluation of persistent headache and visual disturbance where he feels like the upper visual fields are blurry like to have a shadow.  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  Denies any new numbness or tingling.  Has been having persistent and worsening headaches.  Was given outpatient referral to neurology but is unable to get into neurology for several weeks.  He is not a blood thinners.  No fevers. ?  ? ? ?Physical Exam  ? ?Triage Vital Signs: ?ED Triage Vitals  ?Enc Vitals Group  ?   BP 11/05/21 1124 (!) 136/96  ?   Pulse Rate 11/05/21 1124 65  ?   Resp 11/05/21 1124 16  ?   Temp 11/05/21 1124 98.6 ?F (37 ?C)  ?   Temp Source 11/05/21 1119 Oral  ?   SpO2 11/05/21 1124 96 %  ?   Weight 11/05/21 1124 271 lb (122.9 kg)  ?   Height 11/05/21 1124 '6\' 2"'$  (1.88 m)  ?   Head Circumference --   ?   Peak Flow --   ?   Pain Score 11/05/21 1124 7  ?   Pain Loc --   ?   Pain Edu? --   ?   Excl. in [place]? --   ? ? ?Most recent vital signs: ?Vitals:  ? 11/05/21 1515 11/05/21 1531  ?BP:  140/87  ?Pulse: 62 69  ?Resp: 15 16  ?Temp:  98.1 ?F (36.7 ?C)  ?SpO2: 94% 97%  ? ? ? ?Constitutional: Alert  ?Eyes: Conjunctivae are normal.  ?Head: Atraumatic. ?Nose: No congestion/rhinnorhea. ?Mouth/Throat: Mucous membranes are moist.   ?Neck: Painless ROM.  ?Cardiovascular:   Good peripheral circulation. ?Respiratory: Normal respiratory effort.  No retractions.  ?Gastrointestinal: Soft and nontender.  ?Musculoskeletal:  no deformity ?Neurologic:  CN- intact.  Vlurry vision reported in upper visual field of both eyes No facial droop, Normal FNF.  Normal heel to shin.  Sensation intact bilaterally. Normal speech and language. No  gross focal neurologic deficits are appreciated. No gait instability. ? ?Skin:  Skin is warm, dry and intact. No rash noted. ?Psychiatric: Mood and affect are normal. Speech and behavior are normal. ? ? ? ?ED Results / Procedures / Treatments  ? ?Labs ?(all labs ordered are listed, but only abnormal results are displayed) ?Labs Reviewed  ?COMPREHENSIVE METABOLIC PANEL - Abnormal; Notable for the following components:  ?    Result Value  ? Potassium 3.2 (*)   ? Chloride 97 (*)   ? All other components within normal limits  ?CBC  ? ? ? ?EKG ? ? ? ? ?RADIOLOGY ?Please see ED Course for my review and interpretation. ? ?I personally reviewed all radiographic images ordered to evaluate for the above acute complaints and reviewed radiology reports and findings.  These findings were personally discussed with the patient.  Please see medical record for radiology report. ? ? ? ?PROCEDURES: ? ?Critical Care performed:  ? ?Procedures ? ? ?MEDICATIONS ORDERED IN ED: ?Medications  ?prochlorperazine (COMPAZINE) injection 10 mg (10 mg Intravenous Given 11/05/21 1335)  ?diphenhydrAMINE (BENADRYL) injection 12.5 mg (12.5 mg Intravenous Given 11/05/21 1332)  ? ? ? ?IMPRESSION /  MDM / ASSESSMENT AND PLAN / ED COURSE  ?I reviewed the triage vital signs and the nursing notes. ?             ?               ? ?Differential diagnosis includes, but is not limited to, cva, bleed, mass, edema ? ?Patient with recent diagnosis of pituitary macroadenoma with persistent headache and visual disturbance.  Will order blood work as well as repeat CT head to make sure there is no sign of bleed or worsening edema.  Will consult neurosurgery. ? ?Clinical Course as of 11/05/21 1551  ?Wed Nov 05, 2021  ?1440 Case discussed in consultation with [name] states patient would be a surgical candidate and has discussed case with Dr. [name] neurosurgery at [facility] who has offered transfer for expedited work-up. [PR]  ?1527 Patient agreeing to transfer.  He  remains hemodynamically stable and appropriate for transfer to their facility. [PR]  ?  ?Clinical Course User Index ?[PR] Merlyn Lot, MD  ? ? ? ?FINAL CLINICAL IMPRESSION(S) / ED DIAGNOSES  ? ?Final diagnoses:  ?Pituitary macroadenoma ([place])  ? ? ? ?Rx / DC Orders  ? ?ED Discharge Orders   ? ? None  ? ?  ? ? ? ?Note:  This document was prepared using Dragon voice recognition software and may include unintentional dictation errors. ? ?  ?Merlyn Lot, MD ?11/05/21 1551 ? ?

## 2021-11-05 NOTE — ED Triage Notes (Signed)
Pt to ED via POV, pt c/o headache, pt states that the pain starts on the left side of his head and goes around to the right side. Pt had MRI at [facility] 2 days ago and it showed an intrasellar mass. Pt has been referred to out patient neuro but they do not have appt yet. Pt denies N/V. Pt states that he does see a slight shadow above everything he sees. Pt is in NAD.  ?

## 2021-11-05 NOTE — ED Notes (Signed)
Patient's wife took patient's jacket home. Patient has his clothes and shoes on, an I phone watch on one wrist and Fitbit on the other wrist. Patient has two cell phones, wallet and keys in pocket. ?

## 2021-11-05 NOTE — ED Notes (Signed)
No protocols at this time per Dr. Tamala Julian ?

## 2021-11-05 NOTE — ED Notes (Signed)
[facility] has arrived to the hospital. ?

## 2021-11-05 NOTE — Telephone Encounter (Signed)
Patient's wife was in office. ? ?She states that his headaches have significantly worsened since he last saw me. She also notes that he didn't disclose all of his symptoms that he has been having. She states that he has tenderness to his head, vomiting at times and sees "lines." ? ?Due to these symptoms, I recommend patient go to ER immediately. ? ?Inda Coke PA-C ?

## 2021-11-05 NOTE — ED Notes (Signed)
[facility]  CALLED  PER  DR  ROBINSON  MD 

## 2021-11-05 NOTE — ED Notes (Signed)
Report given to Michael with [facility] 

## 2021-11-06 ENCOUNTER — Other Ambulatory Visit (HOSPITAL_COMMUNITY): Payer: Self-pay

## 2021-11-06 DIAGNOSIS — D352 Benign neoplasm of pituitary gland: Secondary | ICD-10-CM | POA: Diagnosis not present

## 2021-11-06 MED ORDER — ACETAMINOPHEN 325 MG PO TABS
ORAL_TABLET | ORAL | 0 refills | Status: AC
Start: 2021-11-06 — End: ?

## 2021-11-07 ENCOUNTER — Other Ambulatory Visit (HOSPITAL_COMMUNITY): Payer: Self-pay

## 2021-11-07 MED ORDER — BUTALBITAL-APAP-CAFFEINE 50-325-40 MG PO TABS
ORAL_TABLET | ORAL | 0 refills | Status: AC
  Filled 2021-11-07: qty 48, 25d supply, fill #0

## 2021-11-10 ENCOUNTER — Ambulatory Visit: Payer: Self-pay | Admitting: Physician Assistant

## 2021-11-10 ENCOUNTER — Encounter: Payer: Self-pay | Admitting: Physician Assistant

## 2021-11-10 ENCOUNTER — Other Ambulatory Visit: Payer: Self-pay

## 2021-11-10 VITALS — BP 128/91 | HR 66 | Temp 97.6°F | Resp 14 | Ht 74.0 in | Wt 267.0 lb

## 2021-11-10 DIAGNOSIS — D352 Benign neoplasm of pituitary gland: Secondary | ICD-10-CM | POA: Diagnosis not present

## 2021-11-10 NOTE — Progress Notes (Signed)
? ?  Subjective: Pituitary Adenoma  ? ? Patient ID: Joel Marshall, male    DOB: [birth year], 41 y.o.   MRN: [id number] ? ?HPI ?Patient requesting work note stating limitations secondary to being diagnosed with pituitary adenoma.  Patient treating doctor is recommending no lifting greater than 10 pounds pending surgery. ? ?Review of Systems ?Hyperlipidemia, hypertension, and obstructive sleep apnea. ?   ?Objective:  ? Physical Exam ?Temperature is 97.6, pulse 66, respiration 14, BP 128/91, patient 90% O2 sat on room air.  Patient weighs 2 and 67 pounds and BMI is 34.28. ?Review of Dr.'s notes revealed limitations as stated above.  Patient currently work note will reflect these limitations pending surgery. ? ? ? ?   ?Assessment & Plan: Pituitary adenoma  ? ?Work note generated as requested. ?

## 2021-11-10 NOTE — Progress Notes (Signed)
Pt needing work note 

## 2021-11-19 ENCOUNTER — Other Ambulatory Visit (HOSPITAL_COMMUNITY): Payer: Self-pay

## 2021-11-19 DIAGNOSIS — D352 Benign neoplasm of pituitary gland: Secondary | ICD-10-CM | POA: Diagnosis not present

## 2021-11-19 MED ORDER — CABERGOLINE 0.5 MG PO TABS
ORAL_TABLET | ORAL | 1 refills | Status: DC
  Filled 2021-11-19: qty 10, 35d supply, fill #0
  Filled 2021-12-15: qty 10, 35d supply, fill #1

## 2021-11-24 ENCOUNTER — Ambulatory Visit: Payer: Self-pay | Admitting: Physician Assistant

## 2021-11-26 ENCOUNTER — Other Ambulatory Visit (HOSPITAL_COMMUNITY): Payer: Self-pay

## 2021-12-16 ENCOUNTER — Other Ambulatory Visit (HOSPITAL_COMMUNITY): Payer: Self-pay

## 2021-12-17 ENCOUNTER — Other Ambulatory Visit (HOSPITAL_COMMUNITY): Payer: Self-pay

## 2021-12-30 DIAGNOSIS — Z87898 Personal history of other specified conditions: Secondary | ICD-10-CM | POA: Diagnosis not present

## 2021-12-30 DIAGNOSIS — D352 Benign neoplasm of pituitary gland: Secondary | ICD-10-CM | POA: Diagnosis not present

## 2021-12-31 ENCOUNTER — Other Ambulatory Visit (HOSPITAL_COMMUNITY): Payer: Self-pay

## 2021-12-31 MED ORDER — CABERGOLINE 0.5 MG PO TABS
ORAL_TABLET | ORAL | 3 refills | Status: DC
  Filled 2021-12-31: qty 8, 30d supply, fill #0
  Filled 2022-02-21: qty 8, 30d supply, fill #1
  Filled 2022-03-21: qty 8, 30d supply, fill #2
  Filled 2022-04-15: qty 8, 30d supply, fill #3
  Filled 2022-05-16: qty 8, 30d supply, fill #4

## 2022-01-10 ENCOUNTER — Other Ambulatory Visit: Payer: Self-pay | Admitting: Cardiovascular Disease

## 2022-01-13 ENCOUNTER — Other Ambulatory Visit (HOSPITAL_COMMUNITY): Payer: Self-pay

## 2022-01-13 MED ORDER — HYDROCHLOROTHIAZIDE 25 MG PO TABS
25.0000 mg | ORAL_TABLET | Freq: Every day | ORAL | 0 refills | Status: DC
Start: 2022-01-13 — End: 2022-01-19
  Filled 2022-01-13: qty 90, 90d supply, fill #0

## 2022-01-14 ENCOUNTER — Other Ambulatory Visit (HOSPITAL_COMMUNITY): Payer: Self-pay

## 2022-01-19 ENCOUNTER — Other Ambulatory Visit (HOSPITAL_COMMUNITY): Payer: Self-pay

## 2022-01-19 ENCOUNTER — Telehealth: Payer: Self-pay | Admitting: Physician Assistant

## 2022-01-19 ENCOUNTER — Ambulatory Visit (INDEPENDENT_AMBULATORY_CARE_PROVIDER_SITE_OTHER): Payer: 59 | Admitting: Physician Assistant

## 2022-01-19 ENCOUNTER — Encounter: Payer: Self-pay | Admitting: Physician Assistant

## 2022-01-19 VITALS — BP 128/90 | HR 66 | Temp 98.0°F | Ht 72.25 in | Wt 269.0 lb

## 2022-01-19 DIAGNOSIS — E782 Mixed hyperlipidemia: Secondary | ICD-10-CM

## 2022-01-19 DIAGNOSIS — R5383 Other fatigue: Secondary | ICD-10-CM | POA: Diagnosis not present

## 2022-01-19 DIAGNOSIS — D352 Benign neoplasm of pituitary gland: Secondary | ICD-10-CM

## 2022-01-19 DIAGNOSIS — Z0001 Encounter for general adult medical examination with abnormal findings: Secondary | ICD-10-CM | POA: Diagnosis not present

## 2022-01-19 DIAGNOSIS — E669 Obesity, unspecified: Secondary | ICD-10-CM | POA: Diagnosis not present

## 2022-01-19 DIAGNOSIS — R7303 Prediabetes: Secondary | ICD-10-CM | POA: Diagnosis not present

## 2022-01-19 DIAGNOSIS — I10 Essential (primary) hypertension: Secondary | ICD-10-CM | POA: Diagnosis not present

## 2022-01-19 LAB — CBC WITH DIFFERENTIAL/PLATELET
Basophils Absolute: 0.1 10*3/uL (ref 0.0–0.1)
Basophils Relative: 0.9 % (ref 0.0–3.0)
Eosinophils Absolute: 0.1 10*3/uL (ref 0.0–0.7)
Eosinophils Relative: 2.1 % (ref 0.0–5.0)
HCT: 38.2 % — ABNORMAL LOW (ref 39.0–52.0)
Hemoglobin: 13.2 g/dL (ref 13.0–17.0)
Lymphocytes Relative: 29.6 % (ref 12.0–46.0)
Lymphs Abs: 1.7 10*3/uL (ref 0.7–4.0)
MCHC: 34.5 g/dL (ref 30.0–36.0)
MCV: 85.4 fl (ref 78.0–100.0)
Monocytes Absolute: 0.4 10*3/uL (ref 0.1–1.0)
Monocytes Relative: 7 % (ref 3.0–12.0)
Neutro Abs: 3.4 10*3/uL (ref 1.4–7.7)
Neutrophils Relative %: 60.4 % (ref 43.0–77.0)
Platelets: 290 10*3/uL (ref 150.0–400.0)
RBC: 4.48 Mil/uL (ref 4.22–5.81)
RDW: 14.1 % (ref 11.5–15.5)
WBC: 5.7 10*3/uL (ref 4.0–10.5)

## 2022-01-19 LAB — COMPREHENSIVE METABOLIC PANEL
ALT: 17 U/L (ref 0–53)
AST: 18 U/L (ref 0–37)
Albumin: 4.3 g/dL (ref 3.5–5.2)
Alkaline Phosphatase: 41 U/L (ref 39–117)
BUN: 17 mg/dL (ref 6–23)
CO2: 34 mEq/L — ABNORMAL HIGH (ref 19–32)
Calcium: 9.8 mg/dL (ref 8.4–10.5)
Chloride: 100 mEq/L (ref 96–112)
Creatinine, Ser: 1.37 mg/dL (ref 0.40–1.50)
GFR: 64.15 mL/min (ref >60.00)
Glucose, Bld: 93 mg/dL (ref 70–99)
Potassium: 3.7 mEq/L (ref 3.5–5.1)
Sodium: 141 mEq/L (ref 135–145)
Total Bilirubin: 0.6 mg/dL (ref 0.2–1.2)
Total Protein: 7.6 g/dL (ref 6.0–8.3)

## 2022-01-19 LAB — LIPID PANEL
Cholesterol: 203 mg/dL — ABNORMAL HIGH (ref 0–200)
HDL: 46.6 mg/dL (ref >39.00)
LDL Cholesterol: 121 mg/dL — ABNORMAL HIGH (ref 0–99)
NonHDL: 156.88
Total CHOL/HDL Ratio: 4
Triglycerides: 179 mg/dL — ABNORMAL HIGH (ref 0.0–149.0)
VLDL: 35.8 mg/dL (ref 0.0–40.0)

## 2022-01-19 LAB — HEMOGLOBIN A1C: Hgb A1c MFr Bld: 5.8 % (ref 4.6–6.5)

## 2022-01-19 MED ORDER — LOSARTAN POTASSIUM-HCTZ 50-12.5 MG PO TABS
1.0000 | ORAL_TABLET | Freq: Every day | ORAL | 1 refills | Status: DC
  Filled 2022-01-19: qty 90, 90d supply, fill #0
  Filled 2022-04-21: qty 90, 90d supply, fill #1

## 2022-01-19 NOTE — Progress Notes (Signed)
Subjective:    Joel Marshall is a 41 y.o. male and is here for a comprehensive physical exam.  HPI  Health Maintenance Due  Topic Date Due   COVID-19 Vaccine (4 - Booster for [place] series) 11/12/2020    Acute Concerns: None  Chronic Issues: HTN Currently taking Bystolic 10 mg daily and HCTZ 25 mg daily. He states he takes HCTZ 25 mg at night since this makes him sleepy during the day. At home blood pressure readings are: checked occasionally. His blood pressure at home are usually in the 90s. Patient denies chest pain, SOB, blurred vision, dizziness, unusual headaches, lower leg swelling. Patient is  compliant with medication. Denies excessive caffeine intake, stimulant usage, excessive alcohol intake, or increase in salt consumption.  BP Readings from Last 3 Encounters:  01/19/22 130/90  11/10/21 (!) 128/91  11/05/21 (!) 144/98   Sleep apnea; Fatigue  Patient has been dealing with this for a while. He complain for increased fatigue for the past few months. He states he has not been sleeping well at night. He has hard time falling sleep at night. Does snore at night. He has had two sleep study done in 2018 and this was positive. However. He was never treated for this issue. He has tried Bipap therapy in the past but this was stopped due to headaches. Denies any concerning sx  Prolactinoma Patient was diagnosed with this recently. He is following up with Tilden Fossa (endo), Tennis Must (neurosurgery) and Alcide Clever (ophtho).  Health Maintenance: Immunizations -- UTD Covid-UTD,09/17/2020 Colonoscopy -- N/A PSA -- No results found for: PSA1, PSA Diet -- Balanced  Sleep habits -- struggling to sleep Exercise -- Likes to walk 1 mile a day. Weight -- 269 Ib (122 kg) Weight history Wt Readings from Last 10 Encounters:  11/10/21 267 lb (121.1 kg)  11/05/21 271 lb (122.9 kg)  11/03/21 271 lb 3.2 oz (123 kg)  01/17/21 266 lb 9.6 oz (120.9 kg)  10/09/20 259 lb 12.8 oz (117.8 kg)   01/17/20 264 lb (119.7 kg)  09/15/19 261 lb (118.4 kg)  09/07/19 260 lb 9.3 oz (118.2 kg)  08/29/19 261 lb 6.4 oz (118.6 kg)  03/21/19 260 lb (117.9 kg)   There is no height or weight on file to calculate BMI. Mood -- Better Tobacco use -- None  Tobacco Use: Low Risk    Smoking Tobacco Use: Never   Smokeless Tobacco Use: Never   Passive Exposure: Not on file    Alcohol use ---  reports no history of alcohol use.      01/17/2021    8:10 AM  Depression screen PHQ 2/9  Decreased Interest 0  Down, Depressed, Hopeless 0  PHQ - 2 Score 0     Other providers/specialists: Patient Care Team: Inda Coke, [place] as PCP - General (Physician Assistant)   PMHx, SurgHx, SocialHx, Medications, and Allergies were reviewed in the Visit Navigator and updated as appropriate.   Past Medical History:  Diagnosis Date   Borderline diabetes    Breast mass 10/15/2016   behind nipple; R   History of intestine removal    Panic attacks    Sleep apnea    had used cpap but has not used it in several months     Past Surgical History:  Procedure Laterality Date   CYST EXCISION Bilateral 09/07/2019   Procedure: Excision of bilateral lower eye lid cysts;  Surgeon: Wallace Going, DO;  Location: [place];  Service:  Plastics;  Laterality: Bilateral;   SMALL INTESTINE SURGERY  1983   "infected" intestine     Family History  Problem Relation Age of Onset   Rheum arthritis Mother    Hypertension Father    Breast cancer Other        cousin   Colon cancer Other 50       cousin   HIV Brother     Social History   Tobacco Use   Smoking status: Never   Smokeless tobacco: Never  Vaping Use   Vaping Use: Never used  Substance Use Topics   Alcohol use: No   Drug use: No    Review of Systems:   Review of Systems  Constitutional:  Negative for chills, fever, malaise/fatigue and weight loss.  HENT:  Negative for hearing loss, sinus pain and sore throat.    Respiratory:  Negative for cough and hemoptysis.   Cardiovascular:  Negative for chest pain, palpitations, leg swelling and PND.  Gastrointestinal:  Negative for abdominal pain, constipation, diarrhea, heartburn, nausea and vomiting.  Genitourinary:  Negative for dysuria, frequency and urgency.  Musculoskeletal:  Negative for back pain, myalgias and neck pain.  Skin:  Negative for itching and rash.  Neurological:  Negative for dizziness, tingling, seizures and headaches.  Endo/Heme/Allergies:  Negative for polydipsia.  Psychiatric/Behavioral:  Negative for depression. The patient is not nervous/anxious.     Objective:   There were no vitals filed for this visit. There is no height or weight on file to calculate BMI.  General Appearance:  Alert, cooperative, no distress, appears stated age  Head:  Normocephalic, without obvious abnormality, atraumatic  Eyes:  PERRL, conjunctiva/corneas clear, EOM's intact, fundi benign, both eyes       Ears:  Normal TM's and external ear canals, both ears  Nose: Nares normal, septum midline, mucosa normal, no drainage    or sinus tenderness  Throat: Lips, mucosa, and tongue normal; teeth and gums normal  Neck: Supple, symmetrical, trachea midline, no adenopathy; thyroid:  No enlargement/tenderness/nodules; no carotit bruit or JVD  Back:   Symmetric, no curvature, ROM normal, no CVA tenderness  Lungs:   Clear to auscultation bilaterally, respirations unlabored  Chest wall:  No tenderness or deformity  Heart:  Regular rate and rhythm, S1 and S2 normal, no murmur, rub   or gallop  Abdomen:   Soft, non-tender, bowel sounds active all four quadrants, no masses, no organomegaly  Extremities: Extremities normal, atraumatic, no cyanosis or edema  Prostate: Not done.   Skin: Skin color, texture, turgor normal, no rashes or lesions  Lymph nodes: Cervical, supraclavicular, and axillary nodes normal  Neurologic: CNII-XII grossly intact. Normal strength,  sensation and reflexes throughout    Assessment/Plan:   Encounter for general adult medical examination with abnormal findings Today patient counseled on age appropriate routine health concerns for screening and prevention, each reviewed and up to date or declined. Immunizations reviewed and up to date or declined. Labs ordered and reviewed. Risk factors for depression reviewed and negative. Hearing function and visual acuity are intact. ADLs screened and addressed as needed. Functional ability and level of safety reviewed and appropriate. Education, counseling and referrals performed based on assessed risks today. Patient provided with a copy of personalized plan for preventive services.  Fatigue, unspecified type Suspect due to uncontrolled sleep apnea -- he does not want to pursue further work-up at this time Recommend working on continued diet and exercise as able Will update blood work as well to r/o  organic cause of sx Follow-up if new/worsening  Essential hypertension Above goal Will change HCTZ 25 to losartan 50 - HCTZ 12.5 mg Continue to monitor BP at home, send averages in about a month  Pre-diabetes Update A1c and provide recommendations accordingly  Mixed hyperlipidemia Update lipid panel and provide recommendations accordingly  Obesity, unspecified classification, unspecified obesity type, unspecified whether serious comorbidity present Continue to work on healthy eating and exercise as able  Pituitary macroadenoma ([place]) Compliant with specialists  Patient Counseling: '[x]'$   Nutrition: Stressed importance of moderation in sodium/caffeine intake, saturated fat and cholesterol, caloric balance, sufficient intake of fresh fruits, vegetables, and fiber.  '[x]'$   Stressed the importance of regular exercise.   '[]'$   Substance Abuse: Discussed cessation/primary prevention of tobacco, alcohol, or other drug use; driving or other dangerous activities under the influence; availability  of treatment for abuse.   '[x]'$   Injury prevention: Discussed safety belts, safety helmets, smoke detector, smoking near bedding or upholstery.   '[]'$   Sexuality: Discussed sexually transmitted diseases, partner selection, use of condoms, avoidance of unintended pregnancy  and contraceptive alternatives.   '[x]'$   Dental health: Discussed importance of regular tooth brushing, flossing, and dental visits.  '[x]'$   Health maintenance and immunizations reviewed. Please refer to Health maintenance section.     I,Savera Zaman,acting as a Education administrator for [employer], PA.,have documented all relevant documentation on the behalf of Inda Coke, PA,as directed by  Inda Coke, PA while in the presence of Inda Coke, [place].   I, Inda Coke, [place], have reviewed all documentation for this visit. The documentation on 01/19/22 for the exam, diagnosis, procedures, and orders are all accurate and complete.  Inda Coke, PA-C [place]

## 2022-01-19 NOTE — Telephone Encounter (Signed)
Pt is to have his physicals before July 1st to avoid premium increase-

## 2022-01-19 NOTE — Patient Instructions (Signed)
It was great to see you!  Stop hydrochlorothiazide Start losartan-hydrochlorothiazide 50-12.5 mg daily Send me a mychart message in 1 month with your blood pressure averages -- OR come see me :)  Please go to the lab for blood work.   Our office will call you with your results unless you have chosen to receive results via MyChart.  If your blood work is normal we will follow-up each year for physicals and as scheduled for chronic medical problems.  If anything is abnormal we will treat accordingly and get you in for a follow-up.  Take care,  Aldona Bar

## 2022-02-23 ENCOUNTER — Encounter (HOSPITAL_COMMUNITY): Payer: Self-pay | Admitting: Pharmacist

## 2022-02-23 ENCOUNTER — Other Ambulatory Visit (HOSPITAL_COMMUNITY): Payer: Self-pay

## 2022-03-03 ENCOUNTER — Other Ambulatory Visit: Payer: Self-pay | Admitting: Physician Assistant

## 2022-03-03 DIAGNOSIS — D352 Benign neoplasm of pituitary gland: Secondary | ICD-10-CM | POA: Diagnosis not present

## 2022-03-03 DIAGNOSIS — R69 Illness, unspecified: Secondary | ICD-10-CM | POA: Diagnosis not present

## 2022-03-04 ENCOUNTER — Other Ambulatory Visit (HOSPITAL_COMMUNITY): Payer: Self-pay

## 2022-03-04 MED ORDER — NEBIVOLOL HCL 10 MG PO TABS
10.0000 mg | ORAL_TABLET | Freq: Every day | ORAL | 1 refills | Status: DC
  Filled 2022-03-04: qty 90, 90d supply, fill #0
  Filled 2022-06-01: qty 90, 90d supply, fill #1

## 2022-03-21 ENCOUNTER — Other Ambulatory Visit (HOSPITAL_COMMUNITY): Payer: Self-pay

## 2022-04-15 ENCOUNTER — Other Ambulatory Visit (HOSPITAL_COMMUNITY): Payer: Self-pay

## 2022-04-22 ENCOUNTER — Other Ambulatory Visit (HOSPITAL_COMMUNITY): Payer: Self-pay

## 2022-04-23 ENCOUNTER — Other Ambulatory Visit (HOSPITAL_COMMUNITY): Payer: Self-pay

## 2022-04-23 DIAGNOSIS — R002 Palpitations: Secondary | ICD-10-CM | POA: Diagnosis not present

## 2022-04-23 MED ORDER — EZETIMIBE 10 MG PO TABS
10.0000 mg | ORAL_TABLET | Freq: Every day | ORAL | 3 refills | Status: DC
  Filled 2022-04-23: qty 90, 90d supply, fill #0
  Filled 2022-07-17: qty 90, 90d supply, fill #1
  Filled 2022-10-14: qty 90, 90d supply, fill #2
  Filled 2023-01-13: qty 90, 90d supply, fill #3

## 2022-05-08 DIAGNOSIS — R002 Palpitations: Secondary | ICD-10-CM | POA: Diagnosis not present

## 2022-05-11 ENCOUNTER — Encounter: Payer: Self-pay | Admitting: *Deleted

## 2022-05-16 ENCOUNTER — Other Ambulatory Visit (HOSPITAL_COMMUNITY): Payer: Self-pay

## 2022-05-27 ENCOUNTER — Other Ambulatory Visit (HOSPITAL_COMMUNITY): Payer: Self-pay

## 2022-05-27 DIAGNOSIS — D352 Benign neoplasm of pituitary gland: Secondary | ICD-10-CM | POA: Diagnosis not present

## 2022-05-27 MED ORDER — CABERGOLINE 0.5 MG PO TABS
ORAL_TABLET | ORAL | 1 refills | Status: DC
  Filled 2022-05-27 – 2022-06-14 (×2): qty 24, 84d supply, fill #0
  Filled 2022-08-24: qty 24, 84d supply, fill #1

## 2022-05-28 ENCOUNTER — Other Ambulatory Visit (HOSPITAL_COMMUNITY): Payer: Self-pay

## 2022-06-02 ENCOUNTER — Other Ambulatory Visit (HOSPITAL_COMMUNITY): Payer: Self-pay

## 2022-06-09 DIAGNOSIS — R002 Palpitations: Secondary | ICD-10-CM | POA: Diagnosis not present

## 2022-06-15 ENCOUNTER — Other Ambulatory Visit (HOSPITAL_COMMUNITY): Payer: Self-pay

## 2022-06-16 ENCOUNTER — Other Ambulatory Visit (HOSPITAL_COMMUNITY): Payer: Self-pay

## 2022-06-26 DIAGNOSIS — D352 Benign neoplasm of pituitary gland: Secondary | ICD-10-CM | POA: Diagnosis not present

## 2022-07-08 ENCOUNTER — Other Ambulatory Visit (HOSPITAL_COMMUNITY): Payer: Self-pay

## 2022-07-08 MED ORDER — TESTOSTERONE 1.62 % TD GEL
TRANSDERMAL | 3 refills | Status: DC
  Filled 2022-07-08: qty 75, 30d supply, fill #0
  Filled 2022-08-01 – 2022-08-06 (×2): qty 75, 30d supply, fill #1
  Filled 2022-10-14: qty 75, 30d supply, fill #2

## 2022-07-17 ENCOUNTER — Other Ambulatory Visit: Payer: Self-pay | Admitting: Physician Assistant

## 2022-07-18 ENCOUNTER — Other Ambulatory Visit (HOSPITAL_COMMUNITY): Payer: Self-pay

## 2022-07-20 ENCOUNTER — Other Ambulatory Visit (HOSPITAL_COMMUNITY): Payer: Self-pay

## 2022-07-20 MED ORDER — LOSARTAN POTASSIUM-HCTZ 50-12.5 MG PO TABS
1.0000 | ORAL_TABLET | Freq: Every day | ORAL | 1 refills | Status: DC
  Filled 2022-07-20: qty 90, 90d supply, fill #0
  Filled 2022-10-14: qty 90, 90d supply, fill #1

## 2022-07-30 ENCOUNTER — Encounter: Payer: Self-pay | Admitting: *Deleted

## 2022-08-01 ENCOUNTER — Other Ambulatory Visit (HOSPITAL_COMMUNITY): Payer: Self-pay

## 2022-08-03 ENCOUNTER — Other Ambulatory Visit: Payer: Self-pay

## 2022-08-06 ENCOUNTER — Other Ambulatory Visit (HOSPITAL_COMMUNITY): Payer: Self-pay

## 2022-08-07 ENCOUNTER — Other Ambulatory Visit: Payer: Self-pay

## 2022-08-24 ENCOUNTER — Other Ambulatory Visit: Payer: Self-pay

## 2022-08-24 ENCOUNTER — Other Ambulatory Visit: Payer: Self-pay | Admitting: Physician Assistant

## 2022-08-25 ENCOUNTER — Other Ambulatory Visit: Payer: Self-pay

## 2022-08-25 ENCOUNTER — Other Ambulatory Visit (HOSPITAL_COMMUNITY): Payer: Self-pay

## 2022-08-25 MED ORDER — NEBIVOLOL HCL 10 MG PO TABS
10.0000 mg | ORAL_TABLET | Freq: Every day | ORAL | 1 refills | Status: DC
  Filled 2022-08-25: qty 90, 90d supply, fill #0
  Filled 2022-12-06: qty 90, 90d supply, fill #1

## 2022-08-26 ENCOUNTER — Other Ambulatory Visit: Payer: Self-pay

## 2022-08-31 ENCOUNTER — Other Ambulatory Visit (HOSPITAL_COMMUNITY): Payer: Self-pay

## 2022-10-15 ENCOUNTER — Other Ambulatory Visit: Payer: Self-pay

## 2022-10-15 ENCOUNTER — Other Ambulatory Visit (HOSPITAL_COMMUNITY): Payer: Self-pay

## 2022-11-16 ENCOUNTER — Other Ambulatory Visit (HOSPITAL_COMMUNITY): Payer: Self-pay

## 2022-11-21 ENCOUNTER — Other Ambulatory Visit (HOSPITAL_COMMUNITY): Payer: Self-pay

## 2022-11-25 DIAGNOSIS — D352 Benign neoplasm of pituitary gland: Secondary | ICD-10-CM | POA: Diagnosis not present

## 2022-11-26 ENCOUNTER — Ambulatory Visit (INDEPENDENT_AMBULATORY_CARE_PROVIDER_SITE_OTHER): Payer: 59 | Admitting: Physician Assistant

## 2022-11-26 VITALS — BP 118/82 | HR 71 | Temp 98.1°F | Resp 16 | Ht 74.0 in | Wt 263.0 lb

## 2022-11-26 DIAGNOSIS — F419 Anxiety disorder, unspecified: Secondary | ICD-10-CM | POA: Diagnosis not present

## 2022-11-26 DIAGNOSIS — F32A Depression, unspecified: Secondary | ICD-10-CM

## 2022-11-26 HISTORY — DX: Anxiety disorder, unspecified: F41.9

## 2022-11-26 NOTE — Assessment & Plan Note (Addendum)
Uncontrolled Declines rx Talk therapy recommended, referral placed I discussed with patient that if they develop any SI, to tell someone immediately and seek medical attention. Follow-up in 3-6 mo, sooner if concerns

## 2022-11-26 NOTE — Progress Notes (Signed)
Joel Marshall is a 42 y.o. male here for a follow up of a pre-existing problem.  History of Present Illness:   Chief Complaint  Patient presents with   Anxiety    Increased anxiety with panic attacks, usually 1-2 per week Loud noises make situation worse   GAD Having worsening anxiety recently Suffering from irritability when youngest child is screaming Sometimes will get so overwhelmed with this that he will sort of shut down and tune everyone out and rock back and forth to self-soothe He also gets sensation of elevated HR with this but has had negative work-up from cardiology - has even worn a heart monitor during this and it did not capture anything Episodes can last between 30 seconds to 5 minutes    Past Medical History:  Diagnosis Date   Anxiety and depression 11/26/2022   Arthritis    Borderline diabetes    Breast mass 10/15/2016   behind nipple; R   History of intestine removal    Hypertension    Panic attacks    Sleep apnea    had used cpap but has not used it in several months     Social History   Tobacco Use   Smoking status: Never   Smokeless tobacco: Never  Vaping Use   Vaping Use: Never used  Substance Use Topics   Alcohol use: No   Drug use: No    Past Surgical History:  Procedure Laterality Date   CYST EXCISION Bilateral 09/07/2019   Procedure: Excision of bilateral lower eye lid cysts;  Surgeon: Peggye Form, DO;  Location: [facility];  Service: Plastics;  Laterality: Bilateral;   SMALL INTESTINE SURGERY  1983   "infected" intestine    Family History  Problem Relation Age of Onset   Rheum arthritis Mother    Arthritis Mother    Hypertension Father    HIV Brother    Breast cancer Other        cousin   Colon cancer Other 85       cousin   Colon cancer Maternal Uncle 34    No Known Allergies  Current Medications:   Current Outpatient Medications:    acetaminophen (TYLENOL) 325 MG tablet, Take 2 tablets by mouth  every 6 hours as needed for fever, Disp: 180 tablet, Rfl: 0   butalbital-acetaminophen-caffeine (FIORICET) 50-325-40 MG tablet, Take 1 tablet by mouth every 6 hours as needed for headache., Disp: 60 tablet, Rfl: 0   cabergoline (DOSTINEX) 0.5 MG tablet, Take 1 tablet (0.5 mg total) by mouth twice a week for 180 days., Disp: 24 tablet, Rfl: 1   Cholecalciferol (VITAMIN D3) 50 MCG (2000 UT) capsule, Take 2,000 Units by mouth daily., Disp: , Rfl:    ezetimibe (ZETIA) 10 MG tablet, Take 1 tablet (10 mg total) by mouth once daily, Disp: 90 tablet, Rfl: 3   losartan-hydrochlorothiazide (HYZAAR) 50-12.5 MG tablet, Take 1 tablet by mouth daily., Disp: 90 tablet, Rfl: 1   nebivolol (BYSTOLIC) 10 MG tablet, Take 1 tablet (10 mg total) by mouth daily., Disp: 90 tablet, Rfl: 1   Testosterone 1.62 % GEL, Apply 1 pump to shoulders and/or upper arms only daily, Disp: 75 g, Rfl: 3   Review of Systems:   ROS Negative unless otherwise specified per HPI.  Vitals:   Vitals:   11/26/22 0929 11/26/22 1005  BP: (!) 145/89 118/82  Pulse: 71   Resp: 16   Temp: 98.1 F (36.7 C)   TempSrc: Temporal  SpO2: 98%   Weight: 263 lb (119.3 kg)   Height: 6\' 2"  (1.88 m)      Body mass index is 33.77 kg/m.  Physical Exam:   Physical Exam Vitals and nursing note reviewed.  Constitutional:      General: He is not in acute distress.    Appearance: He is well-developed. He is not ill-appearing or toxic-appearing.  Cardiovascular:     Rate and Rhythm: Normal rate and regular rhythm.     Pulses: Normal pulses.     Heart sounds: Normal heart sounds, S1 normal and S2 normal.  Pulmonary:     Effort: Pulmonary effort is normal.     Breath sounds: Normal breath sounds.  Skin:    General: Skin is warm and dry.  Neurological:     Mental Status: He is alert.     GCS: GCS eye subscore is 4. GCS verbal subscore is 5. GCS motor subscore is 6.  Psychiatric:        Speech: Speech normal.        Behavior: Behavior  normal. Behavior is cooperative.     Assessment and Plan:   Anxiety and depression Assessment & Plan: Uncontrolled Declines rx Talk therapy recommended, referral placed I discussed with patient that if they develop any SI, to tell someone immediately and seek medical attention. Follow-up in 3-6 mo, sooner if concerns   Orders: -     Ambulatory referral to Psychology     Jarold Motto, PA-C

## 2022-11-26 NOTE — Patient Instructions (Signed)
It was great to see you!  Follow-up with endo about your urination issues  I will place referral for therapist at [facility]  Let's follow-up in 3-6 months, sooner if you have concerns.  Take care,  Jarold Motto PA-C

## 2022-11-27 ENCOUNTER — Other Ambulatory Visit (HOSPITAL_COMMUNITY): Payer: Self-pay

## 2022-11-27 DIAGNOSIS — R310 Gross hematuria: Secondary | ICD-10-CM | POA: Diagnosis not present

## 2022-11-27 DIAGNOSIS — N200 Calculus of kidney: Secondary | ICD-10-CM | POA: Diagnosis not present

## 2022-11-27 DIAGNOSIS — E291 Testicular hypofunction: Secondary | ICD-10-CM | POA: Diagnosis not present

## 2022-11-27 DIAGNOSIS — Z5181 Encounter for therapeutic drug level monitoring: Secondary | ICD-10-CM | POA: Diagnosis not present

## 2022-11-27 DIAGNOSIS — D352 Benign neoplasm of pituitary gland: Secondary | ICD-10-CM | POA: Diagnosis not present

## 2022-12-03 ENCOUNTER — Other Ambulatory Visit (HOSPITAL_COMMUNITY): Payer: Self-pay

## 2022-12-03 MED ORDER — TESTOSTERONE 1.62 % TD GEL
TRANSDERMAL | 3 refills | Status: DC
  Filled 2022-12-03: qty 75, 30d supply, fill #0
  Filled 2023-01-13: qty 75, 30d supply, fill #1
  Filled 2023-02-22: qty 75, 30d supply, fill #2
  Filled 2023-04-18: qty 75, 30d supply, fill #3

## 2022-12-03 MED ORDER — CABERGOLINE 0.5 MG PO TABS
0.5000 mg | ORAL_TABLET | ORAL | 3 refills | Status: DC
  Filled 2022-12-03 (×2): qty 8, 28d supply, fill #0
  Filled 2022-12-24: qty 8, 28d supply, fill #1
  Filled 2023-01-13 – 2023-01-19 (×2): qty 8, 28d supply, fill #2
  Filled 2023-02-20: qty 8, 28d supply, fill #3
  Filled 2023-03-13 – 2023-03-16 (×2): qty 8, 28d supply, fill #4

## 2022-12-04 DIAGNOSIS — R310 Gross hematuria: Secondary | ICD-10-CM | POA: Diagnosis not present

## 2022-12-04 DIAGNOSIS — N200 Calculus of kidney: Secondary | ICD-10-CM | POA: Diagnosis not present

## 2022-12-07 ENCOUNTER — Other Ambulatory Visit (HOSPITAL_COMMUNITY): Payer: Self-pay

## 2022-12-07 ENCOUNTER — Other Ambulatory Visit: Payer: Self-pay

## 2022-12-16 ENCOUNTER — Other Ambulatory Visit (HOSPITAL_COMMUNITY): Payer: Self-pay

## 2022-12-23 ENCOUNTER — Other Ambulatory Visit (HOSPITAL_COMMUNITY): Payer: Self-pay

## 2022-12-25 ENCOUNTER — Other Ambulatory Visit: Payer: Self-pay

## 2023-01-13 ENCOUNTER — Other Ambulatory Visit: Payer: Self-pay | Admitting: Physician Assistant

## 2023-01-13 ENCOUNTER — Other Ambulatory Visit: Payer: Self-pay

## 2023-01-13 MED ORDER — LOSARTAN POTASSIUM-HCTZ 50-12.5 MG PO TABS
1.0000 | ORAL_TABLET | Freq: Every day | ORAL | 1 refills | Status: DC
  Filled 2023-01-13: qty 90, 90d supply, fill #0
  Filled 2023-04-18 – 2023-04-21 (×2): qty 90, 90d supply, fill #1

## 2023-01-14 ENCOUNTER — Other Ambulatory Visit: Payer: Self-pay

## 2023-01-15 ENCOUNTER — Other Ambulatory Visit (HOSPITAL_COMMUNITY): Payer: Self-pay

## 2023-01-18 ENCOUNTER — Other Ambulatory Visit: Payer: Self-pay

## 2023-01-18 ENCOUNTER — Other Ambulatory Visit (HOSPITAL_COMMUNITY): Payer: Self-pay

## 2023-01-19 ENCOUNTER — Other Ambulatory Visit (HOSPITAL_COMMUNITY): Payer: Self-pay

## 2023-01-20 ENCOUNTER — Other Ambulatory Visit: Payer: Self-pay

## 2023-01-21 ENCOUNTER — Ambulatory Visit (INDEPENDENT_AMBULATORY_CARE_PROVIDER_SITE_OTHER): Payer: 59 | Admitting: Physician Assistant

## 2023-01-21 ENCOUNTER — Encounter: Payer: Self-pay | Admitting: Physician Assistant

## 2023-01-21 VITALS — BP 142/90 | HR 63 | Temp 97.5°F | Ht 74.0 in | Wt 258.5 lb

## 2023-01-21 DIAGNOSIS — R319 Hematuria, unspecified: Secondary | ICD-10-CM | POA: Diagnosis not present

## 2023-01-21 DIAGNOSIS — E669 Obesity, unspecified: Secondary | ICD-10-CM | POA: Diagnosis not present

## 2023-01-21 DIAGNOSIS — E782 Mixed hyperlipidemia: Secondary | ICD-10-CM

## 2023-01-21 DIAGNOSIS — I10 Essential (primary) hypertension: Secondary | ICD-10-CM | POA: Diagnosis not present

## 2023-01-21 DIAGNOSIS — R7303 Prediabetes: Secondary | ICD-10-CM

## 2023-01-21 DIAGNOSIS — Z0000 Encounter for general adult medical examination without abnormal findings: Secondary | ICD-10-CM

## 2023-01-21 LAB — LIPID PANEL
Cholesterol: 163 mg/dL (ref 0–200)
HDL: 47.6 mg/dL (ref >39.00)
LDL Cholesterol: 91 mg/dL (ref 0–99)
NonHDL: 115.48
Total CHOL/HDL Ratio: 3
Triglycerides: 123 mg/dL (ref 0.0–149.0)
VLDL: 24.6 mg/dL (ref 0.0–40.0)

## 2023-01-21 LAB — HEMOGLOBIN A1C: Hgb A1c MFr Bld: 5.6 % (ref 4.6–6.5)

## 2023-01-21 NOTE — Progress Notes (Signed)
Subjective:    Joel Marshall is a 42 y.o. male and is here for a comprehensive physical exam.  HPI  There are no preventive care reminders to display for this patient.   Acute Concerns: Hematuria:  He has been following up with [facility].  He states he has a CT urogram and cystoscopy scheduled for 7/12.   Chronic Issues: Hypertension: He has been managing his hypertension with 50-12.5 mg Hyzaar and bystolic 10 mg daily. His blood pressure is elevated during this visit, which he attributes to the rush of getting to his appointment this morning.  BP Readings from Last 3 Encounters:  01/21/23 (!) 150/100  11/26/22 118/82  01/19/22 128/90   Prediabetes: He has been exercising and eating healthier. He reports having lost about 11 lbs in the last year.  Lab Results  Component Value Date   HGBA1C 5.8 01/19/2022    Vit D Deficiency: He is prescribed 2000 UT Vit D.  He is receptive to starting 5000 UT.   Health Maintenance: Immunizations -- UTD Colonoscopy -- N/A PSA -- 0.81 in November 27 2022 Diet -- Maintaining healthy diet. Eating 3 meals with healthier food options. Avoiding fast foods.  Sleep habits -- No concerns Exercise -- He has been staying active and exercising regularly. Tends to use recumbent bike and walking.   Weight --  Recent weight history Wt Readings from Last 10 Encounters:  01/21/23 258 lb 8 oz (117.3 kg)  11/26/22 263 lb (119.3 kg)  01/19/22 269 lb (122 kg)  11/10/21 267 lb (121.1 kg)  11/05/21 271 lb (122.9 kg)  11/03/21 271 lb 3.2 oz (123 kg)  01/17/21 266 lb 9.6 oz (120.9 kg)  10/09/20 259 lb 12.8 oz (117.8 kg)  01/17/20 264 lb (119.7 kg)  09/15/19 261 lb (118.4 kg)   Body mass index is 33.19 kg/m.  Mood -- Stable Alcohol use --  reports no history of alcohol use.  Tobacco use --  Tobacco Use: Low Risk  (01/21/2023)   Patient History    Smoking Tobacco Use: Never    Smokeless Tobacco Use: Never    Passive Exposure: Not on file     Eligible for Low Dose CT? No  UTD with eye doctor? Yes, next annual exam is scheduled 9/18. UTD with dentist? Yes. Last visit was late 2023.      01/21/2023    8:11 AM  Depression screen PHQ 2/9  Decreased Interest 0  Down, Depressed, Hopeless 0  PHQ - 2 Score 0  Altered sleeping 0  Tired, decreased energy 1  Change in appetite 0  Feeling bad or failure about yourself  0  Trouble concentrating 0  Moving slowly or fidgety/restless 0  Suicidal thoughts 0  PHQ-9 Score 1  Difficult doing work/chores Not difficult at all    Other providers/specialists: Patient Care Team: Jarold Motto, [place] as PCP - General (Physician Assistant)    PMHx, SurgHx, SocialHx, Medications, and Allergies were reviewed in the Visit Navigator and updated as appropriate.   Past Medical History:  Diagnosis Date   Anxiety and depression 11/26/2022   Arthritis    Borderline diabetes    Breast mass 10/15/2016   behind nipple; R   History of intestine removal    Hypertension    Panic attacks    Sleep apnea    had used cpap but has not used it in several months     Past Surgical History:  Procedure Laterality Date   CYST EXCISION Bilateral 09/07/2019  Procedure: Excision of bilateral lower eye lid cysts;  Surgeon: Peggye Form, DO;  Location: [facility];  Service: Plastics;  Laterality: Bilateral;   SMALL INTESTINE SURGERY  1983   "infected" intestine     Family History  Problem Relation Age of Onset   Rheum arthritis Mother    Arthritis Mother    Hypertension Father    HIV Brother    Breast cancer Other        cousin   Colon cancer Other 37       cousin   Colon cancer Maternal Uncle 75    Social History   Tobacco Use   Smoking status: Never   Smokeless tobacco: Never  Vaping Use   Vaping Use: Never used  Substance Use Topics   Alcohol use: No   Drug use: No    Review of Systems:   Review of Systems  Constitutional:  Negative for chills, fever,  malaise/fatigue and weight loss.  HENT:  Negative for hearing loss, sinus pain and sore throat.   Respiratory:  Negative for cough and hemoptysis.   Cardiovascular:  Negative for chest pain, palpitations, leg swelling and PND.  Gastrointestinal:  Negative for abdominal pain, constipation, diarrhea, heartburn, nausea and vomiting.  Genitourinary:  Negative for dysuria, frequency and urgency.  Musculoskeletal:  Negative for back pain, myalgias and neck pain.  Skin:  Negative for itching and rash.  Neurological:  Negative for dizziness, tingling, seizures and headaches.  Endo/Heme/Allergies:  Negative for polydipsia.  Psychiatric/Behavioral:  Negative for depression. The patient is not nervous/anxious.     Objective:    Vitals:   01/21/23 0811  BP: (!) 150/100  Pulse: 63  Temp: (!) 97.5 F (36.4 C)  SpO2: 97%    Body mass index is 33.19 kg/m.  General  Alert, cooperative, no distress, appears stated age  Head:  Normocephalic, without obvious abnormality, atraumatic  Eyes:  PERRL, conjunctiva/corneas clear, EOM's intact, fundi benign, both eyes       Ears:  Normal TM's and external ear canals, both ears  Nose: Nares normal, septum midline, mucosa normal, no drainage or sinus tenderness  Throat: Lips, mucosa, and tongue normal; teeth and gums normal  Neck: Supple, symmetrical, trachea midline, no adenopathy;     thyroid:  No enlargement/tenderness/nodules; no carotid bruit or JVD  Back:   Symmetric, no curvature, ROM normal, no CVA tenderness  Lungs:   Clear to auscultation bilaterally, respirations unlabored  Chest wall:  No tenderness or deformity  Heart:  Regular rate and rhythm, S1 and S2 normal, no murmur, rub or gallop  Abdomen:   Soft, non-tender, bowel sounds active all four quadrants, no masses, no organomegaly  Extremities: Extremities normal, atraumatic, no cyanosis or edema  Prostate : Deferred   Skin: Skin color, texture, turgor normal, no rashes or lesions  Lymph  nodes: Cervical, supraclavicular, and axillary nodes normal  Neurologic: CNII-XII grossly intact. Normal strength, sensation and reflexes throughout   AssessmentPlan:   Routine physical examination Today patient counseled on age appropriate routine health concerns for screening and prevention, each reviewed and up to date or declined. Immunizations reviewed and up to date or declined. Labs ordered and reviewed. Risk factors for depression reviewed and negative. Hearing function and visual acuity are intact. ADLs screened and addressed as needed. Functional ability and level of safety reviewed and appropriate. Education, counseling and referrals performed based on assessed risks today. Patient provided with a copy of personalized plan for preventive services.  Essential hypertension Above goal Recommend keeping close eye on this at home - no evidence of end organ damage Continue losartan-hydrochloroTHIAZIDE 50-12.5 mg daily and bystolic 10 mg daily Follow-up with [place] or cardiology if uncontrolled or any concerns  Mixed hyperlipidemia Update lipid panel today and provide recommendations accordingly Consider adding statin per cardiology Continue zetia 10 mg daily  Pre-diabetes Update A1c and provide recommendations accordingly  Obesity, unspecified classification, unspecified obesity type, unspecified whether serious comorbidity present Continue healthy lifestyle efforts  Hematuria, unspecified type Under care of urology - -continue close follow-up    I,Rachel Rivera,acting as a scribe for [employer], PA.,have documented all relevant documentation on the behalf of Jarold Motto, PA,as directed by  Jarold Motto, PA while in the presence of Jarold Motto, [place].  I, Jarold Motto, [place], have reviewed all documentation for this visit. The documentation on 01/21/23 for the exam, diagnosis, procedures, and orders are all accurate and complete.   Jarold Motto, PA-C [place] [facility]

## 2023-01-21 NOTE — Patient Instructions (Addendum)
It was great to see you!  I recommend 5,000 IU vitamin D daily as your Vitamin D is very low. TAKE WITH FOOD.  Keep an eye on your blood pressure!  Please go to the lab for blood work.   Our office will call you with your results unless you have chosen to receive results via MyChart.  If your blood work is normal we will follow-up each year for physicals and as scheduled for chronic medical problems.  If anything is abnormal we will treat accordingly and get you in for a follow-up.  Take care,  Lelon Mast

## 2023-02-11 ENCOUNTER — Ambulatory Visit (INDEPENDENT_AMBULATORY_CARE_PROVIDER_SITE_OTHER): Payer: 59 | Admitting: Clinical

## 2023-02-11 DIAGNOSIS — F411 Generalized anxiety disorder: Secondary | ICD-10-CM

## 2023-02-11 NOTE — Progress Notes (Signed)
                Aaden Buckman, LCSW 
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
## 2023-02-19 DIAGNOSIS — R310 Gross hematuria: Secondary | ICD-10-CM | POA: Diagnosis not present

## 2023-02-19 DIAGNOSIS — N3289 Other specified disorders of bladder: Secondary | ICD-10-CM | POA: Diagnosis not present

## 2023-02-20 ENCOUNTER — Other Ambulatory Visit (HOSPITAL_COMMUNITY): Payer: Self-pay

## 2023-02-22 ENCOUNTER — Other Ambulatory Visit (HOSPITAL_COMMUNITY): Payer: Self-pay

## 2023-02-23 ENCOUNTER — Other Ambulatory Visit (HOSPITAL_COMMUNITY): Payer: Self-pay

## 2023-02-23 ENCOUNTER — Other Ambulatory Visit: Payer: Self-pay

## 2023-02-26 DIAGNOSIS — R310 Gross hematuria: Secondary | ICD-10-CM | POA: Diagnosis not present

## 2023-03-04 ENCOUNTER — Other Ambulatory Visit: Payer: Self-pay

## 2023-03-13 ENCOUNTER — Other Ambulatory Visit (HOSPITAL_COMMUNITY): Payer: Self-pay

## 2023-03-13 ENCOUNTER — Other Ambulatory Visit: Payer: Self-pay | Admitting: Physician Assistant

## 2023-03-15 ENCOUNTER — Other Ambulatory Visit (HOSPITAL_COMMUNITY): Payer: Self-pay

## 2023-03-15 MED ORDER — NEBIVOLOL HCL 10 MG PO TABS
10.0000 mg | ORAL_TABLET | Freq: Every day | ORAL | 1 refills | Status: DC
  Filled 2023-03-15: qty 90, 90d supply, fill #0
  Filled 2023-06-17 (×2): qty 90, 90d supply, fill #1

## 2023-03-18 ENCOUNTER — Other Ambulatory Visit (HOSPITAL_COMMUNITY): Payer: Self-pay

## 2023-03-24 ENCOUNTER — Ambulatory Visit: Payer: 59 | Admitting: Clinical

## 2023-03-24 DIAGNOSIS — F411 Generalized anxiety disorder: Secondary | ICD-10-CM | POA: Diagnosis not present

## 2023-03-24 NOTE — Progress Notes (Signed)
[facility] Counselor/Therapist Progress Note  Patient ID: Joel Marshall, MRN: [id number]    Date: 03/24/23  Time Spent: 2:39  pm - 3:33 pm : 54 Minutes  Treatment Type: Individual Therapy.  Reported Symptoms: Patient reported muscle tension and fatigue.   Mental Status Exam: Appearance:  Neat and Well Groomed     Behavior: Appropriate  Motor: Normal  Speech/Language:  Clear and Coherent  Affect: Appropriate  Mood: normal  Thought process: circumstantial  Thought content:   WNL  Sensory/Perceptual disturbances:   WNL  Orientation: oriented to person, place, and situation  Attention: Good  Concentration: Good  Memory: WNL  Fund of knowledge:  Good  Insight:   Fair  Judgment:  Good  Impulse Control: Good   Risk Assessment: Danger to Self:  No Patient denied current suicidal ideation  Self-injurious Behavior: No Danger to Others: No Patient denied current homicidal ideation Duty to Warn:no Physical Aggression / Violence:No  Access to Firearms a concern: No  Gang Involvement:No   Subjective:  Patient stated, "about the same" in response to symptoms and stressors since last session. Patient stated, "all over the place really, some days good, some days not so good" in response to patient's mood since last session. Patient reported current fatigue. Patient reported experiencing muscle tension and stated muscle tension occurs "pretty much all the time". Patient reported he is open to an evaluation for ADHD. Patient stated, "I was afraid of that" in response to recommendation for a consultation with a psychiatrist. Patient expressed concerned about the potential side affects of medications. Patient stated, "Id definitely want to rule out ADHD because I think both of my kids have it". Patient stated, "I'm probably going to wait on that" in response to medication management consultation. Patient reported his mother wants patient to surrender a life insurance policy in his name  and patient reported he will have to pay taxes on the funds. Patient reported his mother's request made patient angry. Patient reported mixed feelings as it relates to his parents and reported the relationship with his parents is a current stressor. Patient reported his relationship with his father is "contentious".  Patient stated, "at this point I'll try anything" in response to participation in therapy.   Interventions: Clinician conducted session in person at clinician's office at [facility]. Assessed patient's mood and status of stressors since last session. Clinician reviewed diagnosis and treatment recommendations. Provided psycho education related to diagnosis and treatment. Clinician requested for homework patient documentation his thoughts as it relates to potential goals for therapy.   Collaboration of Care: Other Clinician will initiate a referral for ADHD evaluation  Diagnosis:  Generalized anxiety disorder with panic attacks R/O ADHD   Plan: Goals to be determined during follow up appointment on 04/28/23.                     Doree Barthel, LCSW

## 2023-04-06 ENCOUNTER — Other Ambulatory Visit: Payer: Self-pay | Admitting: Physician Assistant

## 2023-04-06 ENCOUNTER — Encounter: Payer: Self-pay | Admitting: Physician Assistant

## 2023-04-06 ENCOUNTER — Other Ambulatory Visit (HOSPITAL_COMMUNITY): Payer: Self-pay

## 2023-04-06 ENCOUNTER — Ambulatory Visit: Payer: Self-pay | Admitting: Physician Assistant

## 2023-04-06 VITALS — BP 135/92 | HR 65 | Temp 97.1°F | Resp 16

## 2023-04-06 DIAGNOSIS — F411 Generalized anxiety disorder: Secondary | ICD-10-CM

## 2023-04-06 MED ORDER — ALPRAZOLAM 0.5 MG PO TABS: 0.5000 mg | ORAL_TABLET | Freq: Two times a day (BID) | ORAL | 0 refills | Status: AC | PRN

## 2023-04-06 MED ORDER — ALPRAZOLAM 0.5 MG PO TABS
0.5000 mg | ORAL_TABLET | Freq: Two times a day (BID) | ORAL | 0 refills | Status: DC | PRN
  Filled 2023-04-06: qty 10, 5d supply, fill #0
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
## 2023-04-18 ENCOUNTER — Other Ambulatory Visit (HOSPITAL_COMMUNITY): Payer: Self-pay

## 2023-04-19 ENCOUNTER — Other Ambulatory Visit (HOSPITAL_COMMUNITY): Payer: Self-pay

## 2023-04-20 ENCOUNTER — Other Ambulatory Visit (HOSPITAL_COMMUNITY): Payer: Self-pay

## 2023-04-20 ENCOUNTER — Other Ambulatory Visit: Payer: Self-pay

## 2023-04-21 ENCOUNTER — Other Ambulatory Visit (HOSPITAL_COMMUNITY): Payer: Self-pay

## 2023-04-22 ENCOUNTER — Other Ambulatory Visit: Payer: Self-pay

## 2023-04-22 ENCOUNTER — Other Ambulatory Visit (HOSPITAL_COMMUNITY): Payer: Self-pay

## 2023-04-23 ENCOUNTER — Other Ambulatory Visit (HOSPITAL_COMMUNITY): Payer: Self-pay

## 2023-04-23 MED ORDER — CABERGOLINE 0.5 MG PO TABS
0.5000 mg | ORAL_TABLET | ORAL | 0 refills | Status: DC
Start: 2023-04-26 — End: 2024-01-24
  Filled 2023-04-23: qty 8, 28d supply, fill #0
  Filled 2023-05-20: qty 2, 7d supply, fill #1

## 2023-04-24 ENCOUNTER — Other Ambulatory Visit (HOSPITAL_COMMUNITY): Payer: Self-pay

## 2023-04-25 ENCOUNTER — Other Ambulatory Visit (HOSPITAL_COMMUNITY): Payer: Self-pay

## 2023-04-25 MED ORDER — EZETIMIBE 10 MG PO TABS
10.0000 mg | ORAL_TABLET | Freq: Every day | ORAL | 0 refills | Status: DC
  Filled 2023-04-25: qty 90, 90d supply, fill #0

## 2023-04-26 ENCOUNTER — Other Ambulatory Visit: Payer: Self-pay

## 2023-04-28 ENCOUNTER — Ambulatory Visit (INDEPENDENT_AMBULATORY_CARE_PROVIDER_SITE_OTHER): Payer: 59 | Admitting: Clinical

## 2023-04-28 DIAGNOSIS — F411 Generalized anxiety disorder: Secondary | ICD-10-CM | POA: Diagnosis not present
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
## 2023-05-05 DIAGNOSIS — D352 Benign neoplasm of pituitary gland: Secondary | ICD-10-CM | POA: Diagnosis not present

## 2023-05-06 ENCOUNTER — Ambulatory Visit (INDEPENDENT_AMBULATORY_CARE_PROVIDER_SITE_OTHER): Payer: 59 | Admitting: Clinical

## 2023-05-06 DIAGNOSIS — F411 Generalized anxiety disorder: Secondary | ICD-10-CM

## 2023-05-06 NOTE — Progress Notes (Addendum)
[facility] Counselor/Therapist Progress Note  Patient ID: Joel Marshall, MRN: [id number]    Date: 05/06/23  Time Spent: 2:36  pm - 3:32 pm : 56 Minutes  Treatment Type: Individual Therapy.  Reported Symptoms: Patient reported he continues to experience anger and symptoms of anxiety.   Mental Status Exam: Appearance:  Neat and Well Groomed     Behavior: Appropriate  Motor: Normal  Speech/Language:  Clear and Coherent  Affect: Appropriate  Mood: dysthymic  Thought process: circumstantial  Thought content:   WNL  Sensory/Perceptual disturbances:   WNL  Orientation: oriented to person, place, and situation  Attention: Good  Concentration: Good  Memory: WNL  Fund of knowledge:  Good  Insight:   Fair  Judgment:  Good  Impulse Control: Fair   Risk Assessment: Danger to Self:  No Patient denied current suicidal ideation  Self-injurious Behavior: No Danger to Others: No Patient denied current homicidal ideation Duty to Warn:no Physical Aggression / Violence:No  Access to Firearms a concern: No  Gang Involvement:No   Subjective:  Patient stated, "I see where I stand when it comes to my mother and I'm about to make the hard decision to just cut them off all together". Patient reported contact with his family is a trigger for feelings of anger. Patient stated, "just trying to figure out how to break the news to my family" in reference to patient's decision to discontinue communication with parents. Patient reported current financial related stressors. Patient stated, "I'm always second guessing myself and doubting myself". Patient reported his son's behaviors when son is told no is a trigger for anger/irritability. Patient stated, "I have a very quick temper".  Patient reported he has experienced low self confidence since childhood.   Interventions: Motivational Interviewing. Clinician conducted session in person at clinician's office at [facility]. Provided psycho  education related to modeling healthy/unhealthy behaviors for his children. Clinician utilized motivational interviewing to explore potential goals for therapy. Clinician utilized a task centered approach in collaboration with patient to develop goals for therapy. Patient participated in development of goals and agreed to goals for therapy.   Collaboration of Care: Other not required at this time  Diagnosis:  Generalized anxiety disorder with panic attacks R/O ADHD   Plan: Patient is to utilize Cognitive Behavioral Therapy, thought re-framing, relaxation techniques, healthy communication strategies, and coping strategies to decrease symptoms associated with their diagnosis. Frequency: bi-weekly  Modality: individual     Long-term goal:   Reduce overall level, frequency, and intensity of the feelings of anxiety and anger as evidenced by decrease in panic attacks, irritability, easily agitated, fluctuation in appetite, difficulty falling asleep and staying asleep, difficulty concentrating, feeling on edge, restlessness,  worry, difficulty controlling the worry, easily distracted, "always fidgety", difficulty completing tasks, difficulty with organization, and difficulty staying on tasks from 7 days/week to 0 to 1 days/week per patient report for at least 3 consecutive months. Target Date: 05/05/24  Progress: goals established 05/06/23   Short-term goal:  Identify triggers for anger/irritability Target Date: 11/03/23  Progress: goals established 05/06/23   develop and implement coping strategies to utilize in response to feelings of anger/irritability to reduce anger outbursts per patient's report  Target Date: 11/03/23  Progress: goals established 05/06/23   Develop and implement healthy communication strategies for patient to utilize when expressing his thoughts and feelings to others in a controlled and assertive way  Target Date: 11/03/23  Progress: goals established 05/06/23   Identify,  challenge, and replace negative  core beliefs, thought patterns, and negative self talk that contribute to feelings of anger, anxiety, and low self confidence with positive thoughts, beliefs, and positive self talk per patient's report Target Date: 11/03/23  Progress: goals established 05/06/23   Develop, establish, and maintain healthy boundaries with others  Target Date: 11/03/23  Progress: goals established 05/06/23                      Doree Barthel, LCSW

## 2023-05-13 ENCOUNTER — Ambulatory Visit: Payer: 59 | Admitting: Clinical

## 2023-05-13 DIAGNOSIS — F411 Generalized anxiety disorder: Secondary | ICD-10-CM | POA: Diagnosis not present

## 2023-05-13 NOTE — Progress Notes (Signed)
[facility] Counselor/Therapist Progress Note  Patient ID: Joel [facility], MRN: [id number],    Date: 05/13/2023  Time Spent: 2:36pm - 3:22pm : 46 minutes   Treatment Type: Individual Therapy  Reported Symptoms: Patient reported recent feelings of anger and frustration  Mental Status Exam: Appearance:  Neat and Well Groomed     Behavior: Appropriate  Motor: Normal  Speech/Language:  Clear and Coherent  Affect: Appropriate  Mood: normal  Thought process: normal  Thought content:   WNL  Sensory/Perceptual disturbances:   WNL  Orientation: oriented to person, place, and situation  Attention: Good  Concentration: Good  Memory: WNL  Fund of knowledge:  Good  Insight:   Fair  Judgment:  Good  Impulse Control: Fair   Risk Assessment: Danger to Self:  No Patient denied current suicidal ideation  Self-injurious Behavior: No Danger to Others: No Patient denied current homicidal ideation Duty to Warn:no Physical Aggression / Violence:No  Access to Firearms a concern: No  Gang Involvement:No   Subjective: Patient stated, "last night I had a bit of a blow up on the highway". Patient reported his daughter was in the car with patient when he became angry as a result of traffic. Patient reported he felt angry and frustrated in response to traffic yesterday and reported experiencing the thought,  "why are you impeding traffic" in response. Patient reported he starting cursing and yelling in response to traffic. Patient reported frustration in response to patient's son's behaviors last night. Patient reported his son fidgets when sitting down and patient's wife asked son to sit up in his seat and son started crying. Patient reported his son's behaviors trigger feelings of frustration. Patient reported he frequently yells when he becomes angry.  Patient stated, "another warning sign is when I become deathly quiet".  Patient stated, "content" in response to patient's mood the majority of  days since last session.  Patient reported he has been reprimanded at work for vocalizing his feelings. Patient stated, "I have anger issues". Patient reported feeling shaky yesterday in response to son crying and in response to loud/high pitched noises.    Interventions: Cognitive Behavioral Therapy. Clinician conducted session in person at clinician's office at [facility]. Reviewed patient's thought record entries. Assisted patient in discussing and identifying thoughts/feelings triggered by traffic and patient's son's behaviors. Provided psycho education related to cognitive restructuring, warning signs of anger and triggers for anger. Explored warning signs patient exhibits when angry. Reviewed recommendation for ADHD evaluation and referral options. Provided psycho education related to ADHD evaluation and symptoms of ADHD. Clinician requested for homework patient continue thought record and identify warning signs for anger.   Collaboration of Care: Other Discussed consent required for referral for ADHD evaluation   Diagnosis:  Generalized anxiety disorder with panic attacks R/O ADHD     Plan: Patient is to utilize Cognitive Behavioral Therapy, thought re-framing, relaxation techniques, healthy communication strategies, and coping strategies to decrease symptoms associated with their diagnosis. Frequency: bi-weekly  Modality: individual      Long-term goal:   Reduce overall level, frequency, and intensity of the feelings of anxiety and anger as evidenced by decrease in panic attacks, irritability, easily agitated, fluctuation in appetite, difficulty falling asleep and staying asleep, difficulty concentrating, feeling on edge, restlessness,  worry, difficulty controlling the worry, easily distracted, "always fidgety", difficulty completing tasks, difficulty with organization, and difficulty staying on tasks from 7 days/week to 0 to 1 days/week per patient report for at least 3  consecutive months.  Target Date: 05/05/24  Progress: progressing    Short-term goal:  Identify triggers for anger/irritability Target Date: 11/03/23  Progress: progressing    develop and implement coping strategies to utilize in response to feelings of anger/irritability to reduce anger outbursts per patient's report  Target Date: 11/03/23  Progress: progressing    Develop and implement healthy communication strategies for patient to utilize when expressing his thoughts and feelings to others in a controlled and assertive way  Target Date: 11/03/23  Progress: progressing    Identify, challenge, and replace negative core beliefs, thought patterns, and negative self talk that contribute to feelings of anger, anxiety, and low self confidence with positive thoughts, beliefs, and positive self talk per patient's report Target Date: 11/03/23  Progress: progressing    Develop, establish, and maintain healthy boundaries with others  Target Date: 11/03/23  Progress: progressing                              Doree Barthel, LCSW

## 2023-05-13 NOTE — Progress Notes (Signed)
                Dezi Schaner, LCSW 

## 2023-05-20 ENCOUNTER — Other Ambulatory Visit (HOSPITAL_COMMUNITY): Payer: Self-pay

## 2023-05-20 ENCOUNTER — Ambulatory Visit: Payer: 59 | Admitting: Clinical

## 2023-05-20 DIAGNOSIS — F411 Generalized anxiety disorder: Secondary | ICD-10-CM

## 2023-05-20 DIAGNOSIS — F410 Panic disorder [episodic paroxysmal anxiety] without agoraphobia: Secondary | ICD-10-CM | POA: Diagnosis not present

## 2023-05-20 NOTE — Progress Notes (Signed)
                Dezi Schaner, LCSW 

## 2023-05-20 NOTE — Progress Notes (Addendum)
[facility] Counselor/Therapist Progress Note  Patient ID: Joel Marshall, MRN: [id number],    Date: 05/20/2023  Time Spent: 2:30pm - 3:16pm : 46 minutes   Treatment Type: Individual Therapy  Reported Symptoms: Patient reported anger at times  Mental Status Exam: Appearance:  Neat and Well Groomed     Behavior: Appropriate  Motor: Normal  Speech/Language:  Clear and Coherent  Affect: Appropriate  Mood: normal  Thought process: normal  Thought content:   WNL  Sensory/Perceptual disturbances:   WNL  Orientation: oriented to person, place, and situation  Attention: Good  Concentration: Good  Memory: WNL  Fund of knowledge:  Good  Insight:   Fair  Judgment:  Good  Impulse Control: Fair   Risk Assessment: Danger to Self:  No Patient denied current suicidal ideation  Self-injurious Behavior: No Danger to Others: No Patient denied current homicidal ideation Duty to Warn:no Physical Aggression / Violence:No  Access to Firearms a concern: No  Gang Involvement:No   Subjective: Patient reported no entries in patient's thought record since last session. Patient stated, "pretty good actually" in response to patient's mood since last session. Patient reported he is concerned about his student loans and stated, "everything else has been pretty good". Patient stated, "definitely a good mood" in response to patient's mood today. Patient reported his wife provided feedback regarding patient's homework assignment and shared with patient warning signs wife observes when patient becomes angry. Patient reported he becomes aggressive and insults others when driving in response to traffic. Patient stated, "if I go quiet and I leave lets just say that I hold onto stuff" and stated, "means I'm not letting that go anytime soon". Patient reported he paces when patient is angry while on the phone. Patient reported a history of  verbal altercations with co-workers and reported he has verbally  threatened co-workers in the past at a previous place of employment. Patient stated, "treating me like I'm less than a human being" in response to thoughts associated with previous altercations with co-workers. Patient reported feeling he is being disrespected is a trigger for anger. Patient reported he feels his parents do not have respect for patient. Patient reported when patient utilizes a condescending tone, patient's tone is a warning sign for anger. Patient reported patient not being allowed to leave a previous job when a family emergency occurred was a trigger for anger.   Interventions: Cognitive Behavioral Therapy. Clinician conducted session in person at clinician's office at [facility]. Reviewed patient's thought record. Assessed patient's mood since last session and assessed patient's current mood. Reviewed patient's homework to identify warning signs for anger. Provided psycho education related to triggers and the potential impact on mood. Assisted patient in exploring and identifying triggers for anger. Assisted patient in discussing and identifying thoughts/feelings triggered by interactions with co-workers at patient's previous place of employment. Clinician requested for homework patient continue thought record and identify triggers for anger.    Collaboration of Care: Other not required at this time   Diagnosis:  Generalized anxiety disorder with panic attacks R/O ADHD     Plan: Patient is to utilize Cognitive Behavioral Therapy, thought re-framing, relaxation techniques, healthy communication strategies, and coping strategies to decrease symptoms associated with their diagnosis. Frequency: bi-weekly  Modality: individual      Long-term goal:   Reduce overall level, frequency, and intensity of the feelings of anxiety and anger as evidenced by decrease in panic attacks, irritability, easily agitated, fluctuation in appetite, difficulty falling asleep and staying  asleep,  difficulty concentrating, feeling on edge, restlessness,  worry, difficulty controlling the worry, easily distracted, "always fidgety", difficulty completing tasks, difficulty with organization, and difficulty staying on tasks from 7 days/week to 0 to 1 days/week per patient report for at least 3 consecutive months. Target Date: 05/05/24  Progress: progressing    Short-term goal:  Identify triggers for anger/irritability Target Date: 11/03/23  Progress: progressing    develop and implement coping strategies to utilize in response to feelings of anger/irritability to reduce anger outbursts per patient's report  Target Date: 11/03/23  Progress: progressing    Develop and implement healthy communication strategies for patient to utilize when expressing his thoughts and feelings to others in a controlled and assertive way  Target Date: 11/03/23  Progress: progressing    Identify, challenge, and replace negative core beliefs, thought patterns, and negative self talk that contribute to feelings of anger, anxiety, and low self confidence with positive thoughts, beliefs, and positive self talk per patient's report Target Date: 11/03/23  Progress: progressing    Develop, establish, and maintain healthy boundaries with others  Target Date: 11/03/23  Progress: progressing                            Doree Barthel, LCSW

## 2023-05-27 ENCOUNTER — Ambulatory Visit: Payer: 59 | Admitting: Clinical

## 2023-05-27 DIAGNOSIS — F410 Panic disorder [episodic paroxysmal anxiety] without agoraphobia: Secondary | ICD-10-CM | POA: Diagnosis not present

## 2023-05-27 DIAGNOSIS — F411 Generalized anxiety disorder: Secondary | ICD-10-CM | POA: Diagnosis not present

## 2023-05-27 NOTE — Progress Notes (Signed)
                Dezi Schaner, LCSW 

## 2023-05-27 NOTE — Progress Notes (Signed)
[facility] Counselor/Therapist Progress Note  Patient ID: Joel [facility], MRN: [id number],    Date: 05/27/2023  Time Spent: 2:40pm - 3:33pm : 53 minutes   Treatment Type: Individual Therapy  Reported Symptoms: Patient reported recent feelings of anxiety and anger  Mental Status Exam: Appearance:  Neat and Well Groomed     Behavior: Appropriate  Motor: Normal  Speech/Language:  Clear and Coherent  Affect: Appropriate  Mood: Patient stated, "its been all over the place today" in response to mood  Thought process: normal  Thought content:   WNL  Sensory/Perceptual disturbances:   WNL  Orientation: oriented to person, place, and situation  Attention: Good  Concentration: Good  Memory: WNL  Fund of knowledge:  Good  Insight:   Fair  Judgment:  Good  Impulse Control: Fair   Risk Assessment: Danger to Self:  No Patient denied current suicidal ideation  Self-injurious Behavior: No Danger to Others: No Patient denied current homicidal ideation Duty to Warn:no Physical Aggression / Violence:No  Access to Firearms a concern: No  Gang Involvement:No   Subjective: Patient stated, "I had an anxiety attack on Saturday" and stated, "that didn't go too well". Patient reported his family went out to eat on Saturday and patient stated his son started "pitching a fit" when patient told his son he could not have dessert without eating his meal. Patient reported he felt frustrated and stated, "beyond frustrated at that point" in response to son's behaviors. Patient reported he yelled at his son in response. Patient reported he experienced a panic attack in response to the situation.  Patient stated, "its been all over the place today" in response to patient's mood today.  Patient reported he was running late this morning which impacted patient's mood. Patient stated, "that's 80% of my anxiety" in reference to traffic. Patient reported traffic, children's "tantrums", when children do not  clean up trash in the car,  and a recent call from patient's brother "to fish for information" on patient's mother's behalf. Patient stated, "every time I talk with my family it brings up more and more rage". Patient reported a statement patient's wife made after patient did not receive his pay check prior to the Christmas holiday and statements his wife made while patient was on a trip in [place] are triggers for anger. Patient stated, "when I was growing up I learned quick to keep my mouth shut". Patient reported his father left the home when father was anger and patient stated, "my dad's infamous line, be seen and not heard". Patient reported his mother yelled at patient and spanked patient if patient threw a tantrum. Patient stated,  "unfortunately I do" in response to similarities in patient's response and parents' responses to anger and parenting.   Interventions: Cognitive Behavioral Therapy. Clinician conducted session in person at clinician's office at [facility]. Assessed patient's mood since last session and assessed patient's current mood. Reviewed patient's thought record entries. Assisted patient in discussing and identifying thoughts/feelings triggered by interaction with son on Saturday. Challenged statements/thoughts and interpretations to help patient reframe recent interaction with patient's son. Provided psycho education related to taking a time out and utilizing deep breathing exercises in response to anxiety and anger. Provided psycho education related to modeling healthy/unhealthy behaviors and setting realistic expectations as it relates to his children. Reviewed patient's homework to identify triggers for anxiety/anger and assisted patient in exploring and identifying additional triggers. Reviewed recommendation for couples counseling. Provided psycho education related to forgiveness. Assisted patient  in exploring models in patient's life that have influenced patient's response  when feeling angry. Assisted patient in exploring and identifying similarities in patient's parents' and patient's response to anger and their parenting styles. Provided psycho education related to healthy communication techniques to utilize when responding to patient's son. Clinician requested for homework patient continue thought record and continue to identify triggers for anger.     Collaboration of Care: Other not required at this time   Diagnosis:  Generalized anxiety disorder with panic attacks R/O ADHD     Plan: Patient is to utilize Cognitive Behavioral Therapy, thought re-framing, relaxation techniques, healthy communication strategies, and coping strategies to decrease symptoms associated with their diagnosis. Frequency: bi-weekly  Modality: individual      Long-term goal:   Reduce overall level, frequency, and intensity of the feelings of anxiety and anger as evidenced by decrease in panic attacks, irritability, easily agitated, fluctuation in appetite, difficulty falling asleep and staying asleep, difficulty concentrating, feeling on edge, restlessness,  worry, difficulty controlling the worry, easily distracted, "always fidgety", difficulty completing tasks, difficulty with organization, and difficulty staying on tasks from 7 days/week to 0 to 1 days/week per patient report for at least 3 consecutive months. Target Date: 05/05/24  Progress: progressing    Short-term goal:  Identify triggers for anger/irritability Target Date: 11/03/23  Progress: progressing    develop and implement coping strategies to utilize in response to feelings of anger/irritability to reduce anger outbursts per patient's report  Target Date: 11/03/23  Progress: progressing    Develop and implement healthy communication strategies for patient to utilize when expressing his thoughts and feelings to others in a controlled and assertive way  Target Date: 11/03/23  Progress: progressing    Identify,  challenge, and replace negative core beliefs, thought patterns, and negative self talk that contribute to feelings of anger, anxiety, and low self confidence with positive thoughts, beliefs, and positive self talk per patient's report Target Date: 11/03/23  Progress: progressing    Develop, establish, and maintain healthy boundaries with others  Target Date: 11/03/23  Progress: progressing                      Doree Barthel, LCSW

## 2023-06-02 DIAGNOSIS — D352 Benign neoplasm of pituitary gland: Secondary | ICD-10-CM | POA: Diagnosis not present

## 2023-06-02 DIAGNOSIS — E291 Testicular hypofunction: Secondary | ICD-10-CM | POA: Diagnosis not present

## 2023-06-03 ENCOUNTER — Other Ambulatory Visit (HOSPITAL_COMMUNITY): Payer: Self-pay

## 2023-06-03 ENCOUNTER — Ambulatory Visit: Payer: 59 | Admitting: Clinical

## 2023-06-03 DIAGNOSIS — F411 Generalized anxiety disorder: Secondary | ICD-10-CM | POA: Diagnosis not present

## 2023-06-03 DIAGNOSIS — F410 Panic disorder [episodic paroxysmal anxiety] without agoraphobia: Secondary | ICD-10-CM

## 2023-06-03 MED ORDER — CABERGOLINE 0.5 MG PO TABS
0.5000 mg | ORAL_TABLET | ORAL | 0 refills | Status: DC
  Filled 2023-06-03: qty 8, 28d supply, fill #0

## 2023-06-03 MED ORDER — TESTOSTERONE 1.62 % TD GEL
TRANSDERMAL | 0 refills | Status: DC
  Filled 2023-06-03: qty 75, 30d supply, fill #0

## 2023-06-03 NOTE — Progress Notes (Signed)
                Dezi Schaner, LCSW 

## 2023-06-03 NOTE — Progress Notes (Signed)
[facility] Counselor/Therapist Progress Note  Patient ID: Joel [facility], MRN: [id number],    Date: 06/03/2023  Time Spent: 2:33pm - 3:28pm : 55 minutes   Treatment Type: Individual Therapy  Reported Symptoms: Patient reported recent feelings of anger  Mental Status Exam: Appearance:  Neat and Well Groomed     Behavior: Appropriate  Motor: Normal  Speech/Language:  Clear and Coherent  Affect: Appropriate  Mood: normal  Thought process: normal  Thought content:   WNL  Sensory/Perceptual disturbances:   WNL  Orientation: oriented to person, place, and situation  Attention: Good  Concentration: Good  Memory: WNL  Fund of knowledge:  Good  Insight:   Fair  Judgment:  Good  Impulse Control: Fair   Risk Assessment: Danger to Self:  No Patient denied current suicidal ideation  Self-injurious Behavior: No Danger to Others: No Patient denied current homicidal ideation Duty to Warn:no Physical Aggression / Violence:No  Access to Firearms a concern: No  Gang Involvement:No   Subjective: Patient reported on Sunday afternoon patient received a call from his mother to notify patient his mother had fallen. Patient reported the call from his mother scared patient. Patient reported later that day patient's wife called patient to notify patient his son had fallen and patient/wife took son to the emergency room. Patient reported this morning patient's son told patient he did not want to listen to patient and patient stated, "I did pretty good not yelling" in reference to patient's response to his son. Patient reported he asked his wife to respond to patient's son this morning. Patient stated, "for him to say I don't want to listen to you, I want to listen to mommy, that hurt" in response to interaction with patient's son this morning. Patient reported he feels his son may have a diagnosis of ADHD.  Patient stated, "I just want my son to talk normal and stop crying and whining".  Patient reported his wife takes away their children's electronics in response to behaviors. Patient stated, "she's the calmer of the two of [place] by far". Patient reported he feels his wife is nurturing  and stated,  "I have some nurturing qualities, not a lot". Patient stated, "pretty much just all over the place" in response to patient's mood since last session.  Patient reported he feels anger has impacted his relationship with his son, daughter, wife, previous co-workers, parents, patient's brother, and friendships. Patient reported anger causes physical symptoms, such as, an increase in blood pressure. Patient reported he feels his anger was too intense when he was working at [employer] and when his brother was being threatened by others. Patient stated, "I'm actually in a great mood" in response to patient's current mood.   Interventions: Cognitive Behavioral Therapy. Clinician conducted session in person at clinician's office at [facility]. Reviewed events since last session and discussed patient's thoughts/feelings in response. Assisted patient in discussing and identifying thoughts/feelings triggered by interaction with patient's son this morning and discussed patient's response to the situation. Provided psycho education related to symptoms of ADHD. Provided reflective listening and validation. Provided psycho education related to communication, modeling healthy forms of communication/expression of emotions and needs. Explored similarities and differences in patient and wife's parenting styles/communication.  Reviewed patient's homework, assisted patient in exploring and identifying when anger has been a problem in areas of patient's life. Clinician requested for homework patient continue thought record and continue to identify triggers for anger.    Collaboration of Care: Other not required at this  time   Diagnosis:  Generalized anxiety disorder with panic attacks R/O ADHD     Plan: Patient is  to utilize [employer] [facility], thought re-framing, relaxation techniques, healthy communication strategies, and coping strategies to decrease symptoms associated with their diagnosis. Frequency: bi-weekly  Modality: individual      Long-term goal:   Reduce overall level, frequency, and intensity of the feelings of anxiety and anger as evidenced by decrease in panic attacks, irritability, easily agitated, fluctuation in appetite, difficulty falling asleep and staying asleep, difficulty concentrating, feeling on edge, restlessness,  worry, difficulty controlling the worry, easily distracted, "always fidgety", difficulty completing tasks, difficulty with organization, and difficulty staying on tasks from 7 days/week to 0 to 1 days/week per patient report for at least 3 consecutive months. Target Date: 05/05/24  Progress: progressing    Short-term goal:  Identify triggers for anger/irritability Target Date: 11/03/23  Progress: progressing    develop and implement coping strategies to utilize in response to feelings of anger/irritability to reduce anger outbursts per patient's report  Target Date: 11/03/23  Progress: progressing    Develop and implement healthy communication strategies for patient to utilize when expressing his thoughts and feelings to others in a controlled and assertive way  Target Date: 11/03/23  Progress: progressing    Identify, challenge, and replace negative core beliefs, thought patterns, and negative self talk that contribute to feelings of anger, anxiety, and low self confidence with positive thoughts, beliefs, and positive self talk per patient's report Target Date: 11/03/23  Progress: progressing    Develop, establish, and maintain healthy boundaries with others  Target Date: 11/03/23  Progress: progressing                    Doree Barthel, LCSW

## 2023-06-08 ENCOUNTER — Other Ambulatory Visit (HOSPITAL_COMMUNITY): Payer: Self-pay

## 2023-06-08 DIAGNOSIS — E291 Testicular hypofunction: Secondary | ICD-10-CM | POA: Diagnosis not present

## 2023-06-08 DIAGNOSIS — D352 Benign neoplasm of pituitary gland: Secondary | ICD-10-CM | POA: Diagnosis not present

## 2023-06-08 MED ORDER — CABERGOLINE 0.5 MG PO TABS
0.5000 mg | ORAL_TABLET | ORAL | 1 refills | Status: AC
  Filled 2023-06-17: qty 32, 75d supply, fill #0
  Filled 2024-01-20 – 2024-01-29 (×2): qty 32, 75d supply, fill #1

## 2023-06-08 MED ORDER — TESTOSTERONE 1.62 % TD GEL
3.0000 | Freq: Every day | TRANSDERMAL | 2 refills | Status: AC
  Filled 2023-06-17: qty 75, 30d supply, fill #0
  Filled 2023-06-27: qty 75, fill #0
  Filled 2023-07-01: qty 75, 20d supply, fill #0
  Filled 2023-08-10: qty 75, 20d supply, fill #1

## 2023-06-17 ENCOUNTER — Ambulatory Visit: Payer: 59 | Admitting: Clinical

## 2023-06-17 ENCOUNTER — Other Ambulatory Visit (HOSPITAL_COMMUNITY): Payer: Self-pay

## 2023-06-17 DIAGNOSIS — F410 Panic disorder [episodic paroxysmal anxiety] without agoraphobia: Secondary | ICD-10-CM | POA: Diagnosis not present

## 2023-06-17 DIAGNOSIS — F411 Generalized anxiety disorder: Secondary | ICD-10-CM | POA: Diagnosis not present

## 2023-06-17 NOTE — Progress Notes (Signed)
[facility] Counselor/Therapist Progress Note  Patient ID: Joel Marshall, MRN: [id number],    Date: 06/17/2023  Time Spent: 2:34pm - 3:23pm : 49 minutes   Treatment Type: Individual Therapy  Reported Symptoms: Patient reported a recent decrease in anger and increased fatigue  Mental Status Exam: Appearance:  Neat and Well Groomed     Behavior: Appropriate  Motor: Normal  Speech/Language:  Clear and Coherent  Affect: Appropriate  Mood: normal  Thought process: normal  Thought content:   WNL  Sensory/Perceptual disturbances:   WNL  Orientation: oriented to person, place, and situation  Attention: Good  Concentration: Good  Memory: WNL  Fund of knowledge:  Good  Insight:   Fair  Judgment:  Good  Impulse Control: Fair   Risk Assessment: Danger to Self:  No Patient denied current suicidal ideation  Self-injurious Behavior: No Danger to Others: No Patient denied current homicidal ideation Duty to Warn:no Physical Aggression / Violence:No  Access to Firearms a concern: No  Gang Involvement:No   Subjective: Patient reported his mother recently called to notify patient one of his mentors passed away. Patient reported his mentor inspired patient to run track and reported his mentor would run with patient. Patient stated, "that was a tough pill to swallow" in response to mentor's passing. Patient stated, "a lot of people were more like a father to me than my own dad". Patient stated, "everything else has been going pretty good". Patient reported no entries in his thought record and reported no anger outbursts since last session.  Patient reported increased fatigue due to recent increase in medications. Patient reported the size of the tumor has not changed since April which prompted increase in medication. Patient reported he will go back in 6 months for another MRI and at that time it will be determined if radiation is needed to treat the tumor. Patient reported recently when  his son cries, patient tells himself he's not going to yell or scream. Patient stated, "I've got to work on it" in regard to patient communicating with son on an age appropriate level. Patient stated, "I think a lot of it is also burnout too". Patient reported he did not take time off between patient's previous job and current job. Patient reported he feels burnout from previous job has carried over into patient's current job. Patient stated,  "I wouldn't say its the best of mood but its not the worst either" in response to patient's mood since last session. Patient stated, "today's more frustration because I didn't get what I wanted to get done" in reference to patient's job.  Patient reported "being disrespected is number one",  "incessant whining", people cutting patient off in traffic, situations that endanger his children, when his family calls, and political text messages are triggers for anger.  Interventions: Cognitive Behavioral Therapy. Clinician conducted session in person at clinician's office at [facility]. Reviewed events since last session. Provided supportive therapy, active listening, and validation as patient discussed recent loss of his mentor and patient's thoughts/feelings in response.  Reviewed patient's thought record. Discussed recent decrease in anger and explored/identified contributing factors to recent decrease. Explored recent changes in patient's behavioral response when angry. Assisted patient in exploring and identifying additional triggers for anger. Provided psycho education related to positive self talk. Provided reflective listening and validated patient's recent behavioral changes in response to anger. Clinician requested for homework patient continue thought record and continue to identify triggers for anger.     Collaboration of Care:  Other not required at this time   Diagnosis:  Generalized anxiety disorder with panic attacks R/O ADHD     Plan: Patient is  to utilize Cognitive Behavioral Therapy, thought re-framing, relaxation techniques, healthy communication strategies, and coping strategies to decrease symptoms associated with their diagnosis. Frequency: bi-weekly  Modality: individual      Long-term goal:   Reduce overall level, frequency, and intensity of the feelings of anxiety and anger as evidenced by decrease in panic attacks, irritability, easily agitated, fluctuation in appetite, difficulty falling asleep and staying asleep, difficulty concentrating, feeling on edge, restlessness,  worry, difficulty controlling the worry, easily distracted, "always fidgety", difficulty completing tasks, difficulty with organization, and difficulty staying on tasks from 7 days/week to 0 to 1 days/week per patient report for at least 3 consecutive months. Target Date: 05/05/24  Progress: progressing    Short-term goal:  Identify triggers for anger/irritability Target Date: 11/03/23  Progress: progressing    develop and implement coping strategies to utilize in response to feelings of anger/irritability to reduce anger outbursts per patient's report  Target Date: 11/03/23  Progress: progressing    Develop and implement healthy communication strategies for patient to utilize when expressing his thoughts and feelings to others in a controlled and assertive way  Target Date: 11/03/23  Progress: progressing    Identify, challenge, and replace negative core beliefs, thought patterns, and negative self talk that contribute to feelings of anger, anxiety, and low self confidence with positive thoughts, beliefs, and positive self talk per patient's report Target Date: 11/03/23  Progress: progressing    Develop, establish, and maintain healthy boundaries with others  Target Date: 11/03/23  Progress: progressing                Doree Barthel, LCSW

## 2023-06-17 NOTE — Progress Notes (Signed)
                Dezi Schaner, LCSW 

## 2023-06-18 ENCOUNTER — Other Ambulatory Visit (HOSPITAL_COMMUNITY): Payer: Self-pay

## 2023-06-19 ENCOUNTER — Other Ambulatory Visit (HOSPITAL_COMMUNITY): Payer: Self-pay

## 2023-06-24 ENCOUNTER — Ambulatory Visit: Payer: 59 | Admitting: Clinical

## 2023-06-24 DIAGNOSIS — F410 Panic disorder [episodic paroxysmal anxiety] without agoraphobia: Secondary | ICD-10-CM

## 2023-06-24 DIAGNOSIS — F411 Generalized anxiety disorder: Secondary | ICD-10-CM

## 2023-06-24 NOTE — Progress Notes (Signed)
[facility] Counselor/Therapist Progress Note  Patient ID: Joel Marshall, MRN: [id number],    Date: 06/24/2023  Time Spent: 2:33pm - 3:25pm : 52 minutes   Treatment Type: Individual Therapy  Reported Symptoms: Patient reported recent symptoms of a panic attack  Mental Status Exam: Appearance:  Neat and Well Groomed     Behavior: Appropriate  Motor: Normal  Speech/Language:  Clear and Coherent  Affect: Appropriate  Mood: Patient stated, "not in the best of moods" in response to current mood.   Thought process: normal  Thought content:   WNL  Sensory/Perceptual disturbances:   Headache   Orientation: oriented to person, place, and situation  Attention: Good  Concentration: Good  Memory: WNL  Fund of knowledge:  Good  Insight:   Fair  Judgment:  Good  Impulse Control: Fair   Risk Assessment: Danger to Self:  No Patient denied current suicidal ideation  Self-injurious Behavior: No Danger to Others: No Patient denied current homicidal ideation Duty to Warn:no Physical Aggression / Violence:No  Access to Firearms a concern: No  Gang Involvement:No   Subjective:  Patient stated, "today's been kind of rough because I have been fighting off a nasty headache" and stated, "not in the best of moods". Patient stated, "its very upsetting" in response to mood since last session. Patient reported the recent election and political text messages have negatively impacted patient's mood. Patient reported his cousin sent patient a text message regarding the election and patient stated, "my response was nonchalant". Patient stated, "it was more of an annoyance that she sent it while I was at work". Patient reported feeling annoyed in response to cousin's message and reported he felt annoyed due to patient's feelings regarding politics and the timing of when cousin sent the message. Patient stated, "couldn't really think of any other triggers other than the ones we discussed" in response  to triggers for anger. Patient reported no recent feelings of anger. Patient stated, "Im making a concerted effort to let it not get to me" in regard patient's response to triggers for anger. Patient reported his son's behaviors (tantrums) continue to be a trigger for feelings of anger.  Patient reported stress from the week of the election and his son not wanting to take a bath were triggers for anxiety. Patient reported he experienced a panic attack in response to his son not wanting to take a bath. Patient stated, "I was deep breathing and it wasn't working". Patient reported there is a park in [place] that is a serene place for patient and reported [place] is also a calming place to patient.   Interventions: Cognitive Behavioral Therapy. Clinician conducted session in person at clinician's office at [facility]. Assessed patient's mood since last session and assessed current mood. Reviewed patient's thought record entries and patient's emotional/behavioral responses to entries. Explored and identified triggers for decline in mood and anxiety. Provided psycho education related to the use of a behavior chart in response to son's behaviors and provided psycho education related to strategies to encourage healthy expressions of emotion. Provided psycho education related to imagery/visualization. Explored and identified places patient could utilize for imagery/visualization exercise. Clinician requested for homework patient continue thought record, practice deep breathing and imagery/visualization exercise.      Collaboration of Care: Other not required at this time   Diagnosis:  Generalized anxiety disorder with panic attacks R/O ADHD     Plan: Patient is to utilize Cognitive Behavioral Therapy, thought re-framing, relaxation techniques, healthy communication  strategies, and coping strategies to decrease symptoms associated with their diagnosis. Frequency: bi-weekly  Modality:  individual      Long-term goal:   Reduce overall level, frequency, and intensity of the feelings of anxiety and anger as evidenced by decrease in panic attacks, irritability, easily agitated, fluctuation in appetite, difficulty falling asleep and staying asleep, difficulty concentrating, feeling on edge, restlessness,  worry, difficulty controlling the worry, easily distracted, "always fidgety", difficulty completing tasks, difficulty with organization, and difficulty staying on tasks from 7 days/week to 0 to 1 days/week per patient report for at least 3 consecutive months. Target Date: 05/05/24  Progress: progressing    Short-term goal:  Identify triggers for anger/irritability Target Date: 11/03/23  Progress: progressing    develop and implement coping strategies to utilize in response to feelings of anger/irritability to reduce anger outbursts per patient's report  Target Date: 11/03/23  Progress: progressing    Develop and implement healthy communication strategies for patient to utilize when expressing his thoughts and feelings to others in a controlled and assertive way  Target Date: 11/03/23  Progress: progressing    Identify, challenge, and replace negative core beliefs, thought patterns, and negative self talk that contribute to feelings of anger, anxiety, and low self confidence with positive thoughts, beliefs, and positive self talk per patient's report Target Date: 11/03/23  Progress: progressing    Develop, establish, and maintain healthy boundaries with others  Target Date: 11/03/23  Progress: progressing                Doree Barthel, LCSW

## 2023-06-24 NOTE — Progress Notes (Signed)
                Dezi Schaner, LCSW 

## 2023-06-28 ENCOUNTER — Other Ambulatory Visit: Payer: Self-pay

## 2023-06-28 ENCOUNTER — Other Ambulatory Visit (HOSPITAL_COMMUNITY): Payer: Self-pay

## 2023-06-30 ENCOUNTER — Other Ambulatory Visit (HOSPITAL_COMMUNITY): Payer: Self-pay

## 2023-07-01 ENCOUNTER — Ambulatory Visit: Payer: 59 | Admitting: Clinical

## 2023-07-01 ENCOUNTER — Other Ambulatory Visit (HOSPITAL_COMMUNITY): Payer: Self-pay

## 2023-07-01 DIAGNOSIS — F411 Generalized anxiety disorder: Secondary | ICD-10-CM

## 2023-07-01 DIAGNOSIS — F410 Panic disorder [episodic paroxysmal anxiety] without agoraphobia: Secondary | ICD-10-CM

## 2023-07-01 NOTE — Progress Notes (Signed)
[facility] Counselor/Therapist Progress Note  Patient ID: Scotte Gildea, MRN: [id number],    Date: 07/01/2023  Time Spent: 2:32pm - 3:15pm : 43 minutes   Treatment Type: Individual Therapy  Reported Symptoms: none reported   Mental Status Exam: Appearance:  Neat and Well Groomed     Behavior: Appropriate  Motor: Normal  Speech/Language:  Clear and Coherent  Affect: Appropriate  Mood: normal  Thought process: normal  Thought content:   WNL  Sensory/Perceptual disturbances:   WNL  Orientation: oriented to person, place, and situation  Attention: Good  Concentration: Good  Memory: WNL  Fund of knowledge:  Good  Insight:   Good  Judgment:  Good  Impulse Control: Good   Risk Assessment: Danger to Self:  No Patient denied current suicidal ideation  Self-injurious Behavior: No Danger to Others: No Patient denied current homicidal ideation Duty to Warn:no Physical Aggression / Violence:No  Access to Firearms a concern: No  Gang Involvement:No   Subjective: Patient stated, "surprisingly nothing on the thought record" in regard to patient's homework assignment. Patient stated, "been going great" in response to events since last session. Patient reported work and patient's home life have been going well. Patient stated, "surprisingly I really don't let it (anger) get to me anymore". Patient stated, "when he (son) gets out of line I take his electronics". Patient reported he recently attended his college alma mater and was able to reconnect with previous classmates/friends. Patient reported he enjoyed the visit and was able to visit a local park he finds calming. Patient reported he was recently informed that a close friend is recently divorced and patient reported he is upset with friend's husband. Patient acknowledged his behavioral responses to anger have the potential to affect patient's wife and children. Patient stated, "my mood has been great" in response to mood since  last session. Patient reported "great" mood today.   Interventions: Cognitive Behavioral Therapy. Clinician conducted session in person at clinician's office at [facility] Reviewed patient's thought record. Reviewed events since last session. Explored recent changes in patient's response to to triggers for anger and changes in patient's parenting strategies. Reviewed the use of deep breathing and imagery/visualization. Assessed patient's mood. Discussed patient's thoughts/feelings in response to recent news of friend's divorce.  Explored and identified potential consequences to patient's initial response to friend's divorce and the potential impact on other's. Challenged thoughts and assisted patient in reframing his response.  Clinician requested for homework patient continue thought record, practice deep breathing and imagery/visualization exercise.    Collaboration of Care: Other not required at this time   Diagnosis:  Generalized anxiety disorder with panic attacks R/O ADHD     Plan: Patient is to utilize Cognitive Behavioral Therapy, thought re-framing, relaxation techniques, healthy communication strategies, and coping strategies to decrease symptoms associated with their diagnosis. Frequency: bi-weekly  Modality: individual      Long-term goal:   Reduce overall level, frequency, and intensity of the feelings of anxiety and anger as evidenced by decrease in panic attacks, irritability, easily agitated, fluctuation in appetite, difficulty falling asleep and staying asleep, difficulty concentrating, feeling on edge, restlessness,  worry, difficulty controlling the worry, easily distracted, "always fidgety", difficulty completing tasks, difficulty with organization, and difficulty staying on tasks from 7 days/week to 0 to 1 days/week per patient report for at least 3 consecutive months. Target Date: 05/05/24  Progress: progressing    Short-term goal:  Identify triggers for  anger/irritability Target Date: 11/03/23  Progress: progressing  develop and implement coping strategies to utilize in response to feelings of anger/irritability to reduce anger outbursts per patient's report  Target Date: 11/03/23  Progress: progressing    Develop and implement healthy communication strategies for patient to utilize when expressing his thoughts and feelings to others in a controlled and assertive way  Target Date: 11/03/23  Progress: progressing    Identify, challenge, and replace negative core beliefs, thought patterns, and negative self talk that contribute to feelings of anger, anxiety, and low self confidence with positive thoughts, beliefs, and positive self talk per patient's report Target Date: 11/03/23  Progress: progressing    Develop, establish, and maintain healthy boundaries with others  Target Date: 11/03/23  Progress: progressing              Doree Barthel, LCSW

## 2023-07-01 NOTE — Progress Notes (Signed)
                Dezi Schaner, LCSW 

## 2023-07-06 ENCOUNTER — Other Ambulatory Visit (HOSPITAL_COMMUNITY): Payer: Self-pay

## 2023-07-08 ENCOUNTER — Ambulatory Visit: Payer: 59 | Admitting: Clinical

## 2023-07-08 DIAGNOSIS — F410 Panic disorder [episodic paroxysmal anxiety] without agoraphobia: Secondary | ICD-10-CM

## 2023-07-08 DIAGNOSIS — F411 Generalized anxiety disorder: Secondary | ICD-10-CM | POA: Diagnosis not present

## 2023-07-08 NOTE — Progress Notes (Signed)
                Dezi Schaner, LCSW 

## 2023-07-08 NOTE — Progress Notes (Signed)
[facility] Counselor/Therapist Progress Note  Patient ID: Joel Marshall, MRN: [id number],    Date: 07/08/2023  Time Spent: 2:34pm - 3:28pm : 54 minutes   Treatment Type: Individual Therapy  Reported Symptoms: Patient reported recent feelings of anger, frustration, hopelessness, rage  Mental Status Exam: Appearance:  Neat and Well Groomed     Behavior: Appropriate  Motor: Normal  Speech/Language:  Clear and Coherent  Affect: Appropriate  Mood: Patient stated, "somewhere between apathy and akinetic mutism" in response to current mood  Thought process: normal  Thought content:   WNL  Sensory/Perceptual disturbances:   WNL  Orientation: oriented to person, place, and situation  Attention: Good  Concentration: Good  Memory: WNL  Fund of knowledge:  Good  Insight:   Good  Judgment:  Good  Impulse Control: Good   Risk Assessment: Danger to Self:  No Patient denied current suicidal ideation  Self-injurious Behavior: No Danger to Others: No Patient denied current homicidal ideation Duty to Warn:no Physical Aggression / Violence:No  Access to Firearms a concern: No  Gang Involvement:No   Subjective: Patient reported 8 days before thanksgiving is a trigger for anger, frustration, hopelessness, and rage each year. Patient stated, "I just don't like this time of year". Patient reported he was laid off from a previous employer 8 days before thanksgiving.  Patient reported fear his wife would leave if he did not have a job. Patient stated, "every other girlfriend I've had left", "every time I'm down everyone deserts me".   Patient stated, "It feels like I'm the one always putting in the effort" in reference to relationships with friends. Patient stated, "Yea I'm still a little bit tense" and stated, "somewhere between apathy and akinetic mutism" in response to patient's mood. Patient reported during this time each year patient's thoughts revert back to when patient was laid off  by previous employer. Patient stated,  "I would have thought that after 15 years id be over it but I'm not". Patient stated,  "I've never been able to let go of anything", "I've held grudges from years ago". Patient stated, "none" in response to ways patient has benefited from not "letting go". Patient stated, "the stress, its taking a toll", "I think it might have contributed to this tumor", "my mood definitely" in response to negative impact of not "letting go". Patient stated, "I've had that done to me my whole life" in response to disqualifying the positives and stated, "I know I do" in response to inquiry regarding patient experiencing disqualifying the positives. Patient stated,  "that's something I've experienced my whole life" in response to magnification and minimizing. Patient stated, "yes a lot" in response to jumping to conclusions.  During session, patient practiced challenging negative thoughts related to this time of year and was able to identify evidence for/against thoughts. Patient stated, "the job situation is a whole lot better".   Interventions: Cognitive Behavioral Therapy. Clinician conducted session in person at clinician's office at [facility]. Assessed patient's mood since last session and assessed current mood. Assisted patient in exploring and identifying thoughts/feelings triggered by this time of year. Assisted patient in examining fear of wife leaving after patient was laid off and assisted patient in identifying thoughts associated with feelings of fear. Challenged cognitive distortions. Explored difficulty patient experiences "letting go" and barriers to "letting go". Assisted patient in exploring ways in which patient has benefited from not "letting go" and explored any negative impact patient has experienced from not "letting go". Provided psycho  education related to cognitive distortions and explored cognitive distortions patient experiences. Provided psycho education  related to challenging cognitive distortions and assisted patient in practicing challenging cognitive distortion during session. Clinician requested for homework patient continue thought record and practice identifying evidence for/against thoughts.    Collaboration of Care: Other not required at this time   Diagnosis:  Generalized anxiety disorder with panic attacks R/O ADHD     Plan: Patient is to utilize Cognitive Behavioral Therapy, thought re-framing, relaxation techniques, healthy communication strategies, and coping strategies to decrease symptoms associated with their diagnosis. Frequency: bi-weekly  Modality: individual      Long-term goal:   Reduce overall level, frequency, and intensity of the feelings of anxiety and anger as evidenced by decrease in panic attacks, irritability, easily agitated, fluctuation in appetite, difficulty falling asleep and staying asleep, difficulty concentrating, feeling on edge, restlessness,  worry, difficulty controlling the worry, easily distracted, "always fidgety", difficulty completing tasks, difficulty with organization, and difficulty staying on tasks from 7 days/week to 0 to 1 days/week per patient report for at least 3 consecutive months. Target Date: 05/05/24  Progress: progressing    Short-term goal:  Identify triggers for anger/irritability Target Date: 11/03/23  Progress: progressing    develop and implement coping strategies to utilize in response to feelings of anger/irritability to reduce anger outbursts per patient's report  Target Date: 11/03/23  Progress: progressing    Develop and implement healthy communication strategies for patient to utilize when expressing his thoughts and feelings to others in a controlled and assertive way  Target Date: 11/03/23  Progress: progressing    Identify, challenge, and replace negative core beliefs, thought patterns, and negative self talk that contribute to feelings of anger, anxiety, and low  self confidence with positive thoughts, beliefs, and positive self talk per patient's report Target Date: 11/03/23  Progress: progressing    Develop, establish, and maintain healthy boundaries with others  Target Date: 11/03/23  Progress: progressing            Doree Barthel, LCSW

## 2023-07-13 DIAGNOSIS — Z09 Encounter for follow-up examination after completed treatment for conditions other than malignant neoplasm: Secondary | ICD-10-CM | POA: Diagnosis not present

## 2023-07-13 DIAGNOSIS — Z87448 Personal history of other diseases of urinary system: Secondary | ICD-10-CM | POA: Diagnosis not present

## 2023-07-18 ENCOUNTER — Other Ambulatory Visit: Payer: Self-pay | Admitting: Physician Assistant

## 2023-07-19 ENCOUNTER — Other Ambulatory Visit (HOSPITAL_COMMUNITY): Payer: Self-pay

## 2023-07-19 IMAGING — MR MR HEAD W/O CM
10 series · 48 of 48 positions shown · non-contrast
Comparison: None.

CLINICAL DATA: Headache

EXAM:
MRI HEAD WITHOUT CONTRAST
TECHNIQUE: Multiplanar, multiecho pulse sequences of the brain and surrounding
structures were obtained without intravenous contrast.

[Series 5: DWI · axial · 3.0mm · 1.36mm/px · z∈[-53,+88]mm · 8 of 96 slices shown (1 of 2)]
[im 1/96]
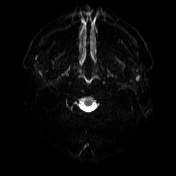
[im 14/96]
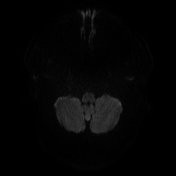
[im 28/96]
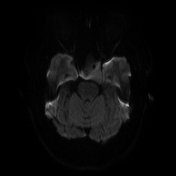
[im 41/96]
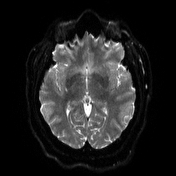
[im 55/96]
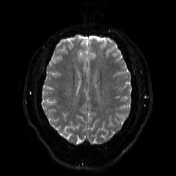
[im 68/96]
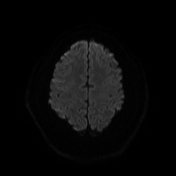
[im 82/96]
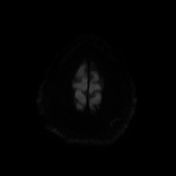
[im 96/96]
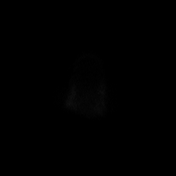

[Series 6: DWI · axial · 3.0mm · 1.36mm/px · z∈[-53,+88]mm · 4 of 48 slices shown (2 of 2)]
[im 1/48]
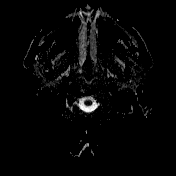
[im 16/48]
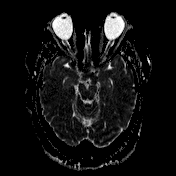
[im 32/48]
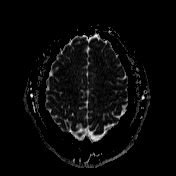
[im 48/48]
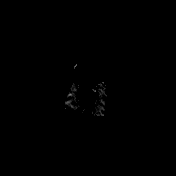

[Series 7: T1 · sagittal · 5.0mm · 0.75mm/px · 2 of 26 slices shown (1 of 2)]
[im 1/26]
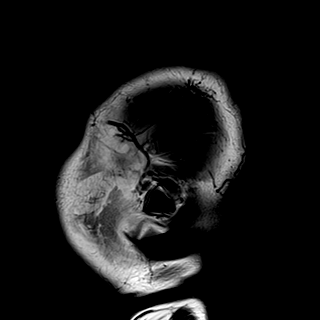
[im 26/26]
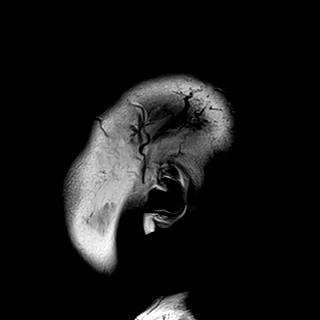

[Series 8: T2 · axial · 5.0mm · 0.62mm/px · z∈[-61,+101]mm · 2 of 26 slices shown (1 of 2)]
[im 1/26]
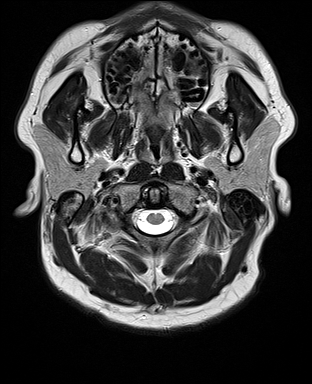
[im 26/26]
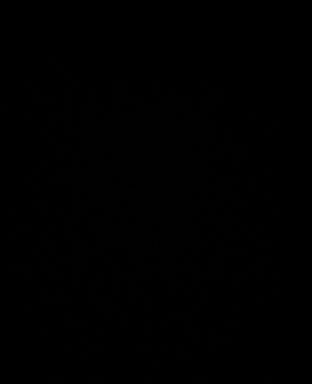

[Series 9: swi_images · axial · 3.0mm · 0.75mm/px · z∈[-87,+126]mm · 6 of 72 slices shown]
[im 1/72]
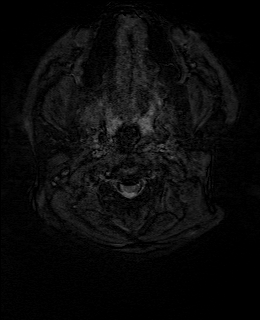
[im 15/72]
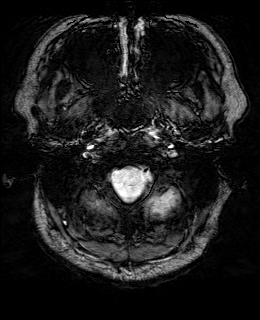
[im 29/72]
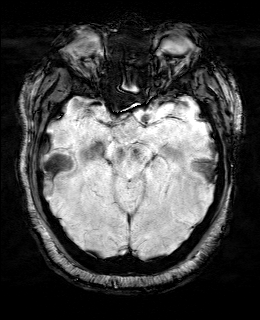
[im 43/72]
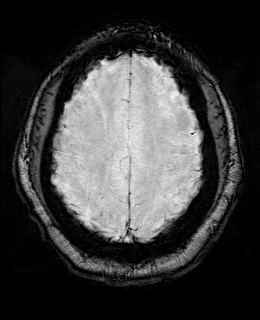
[im 57/72]
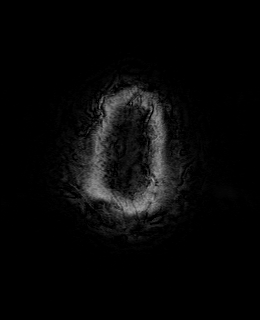
[im 72/72]
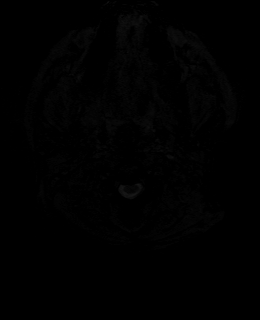

[Series 11: FLAIR · axial · 3.0mm · 0.75mm/px · z∈[-57,+96]mm · 4 of 52 slices shown]
[im 1/52]
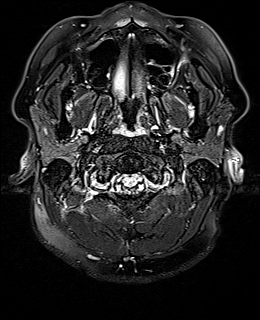
[im 18/52]
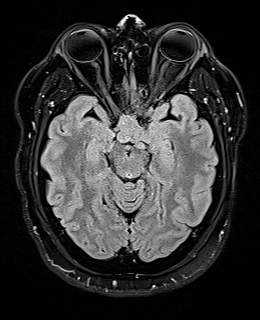
[im 35/52]
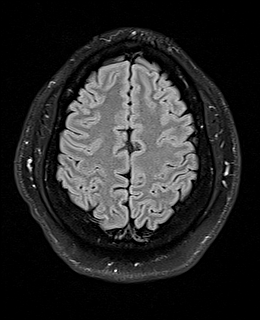
[im 52/52]
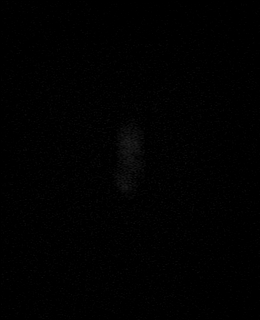

[Series 12: T1 · axial · 1.0mm · 0.94mm/px · z∈[-54,+89]mm · 12 of 144 slices shown (2 of 2)]
[im 1/144]
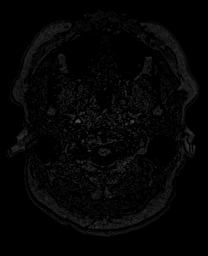
[im 14/144]
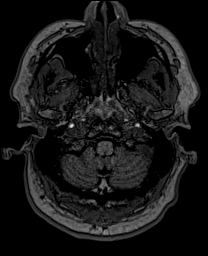
[im 27/144]
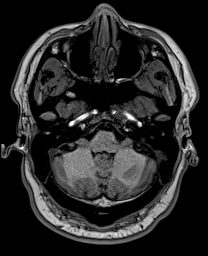
[im 40/144]
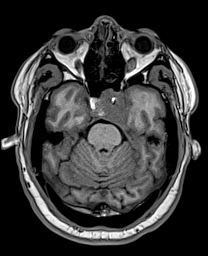
[im 53/144]
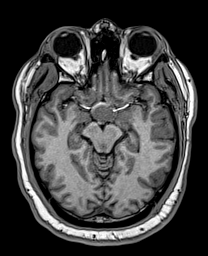
[im 66/144]
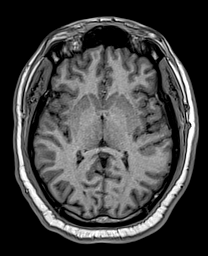
[im 79/144]
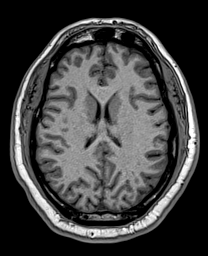
[im 92/144]
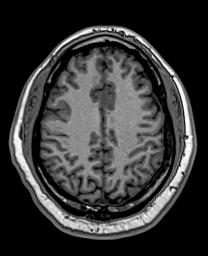
[im 105/144]
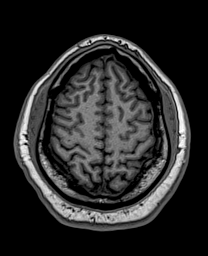
[im 118/144]
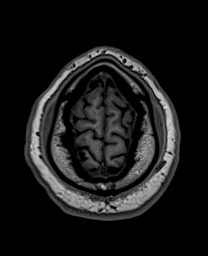
[im 131/144]
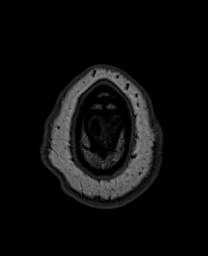
[im 144/144]
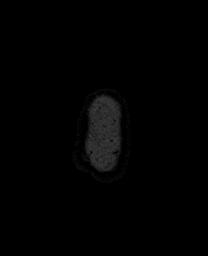

[Series 13: cor dwi_tracew · coronal · 5.0mm · 1.53mm/px · 5 of 60 slices shown]
[im 1/60]
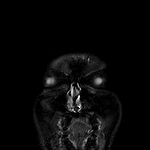
[im 15/60]
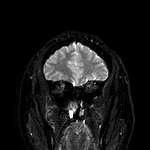
[im 30/60]
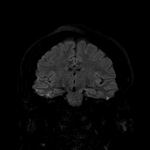
[im 45/60]
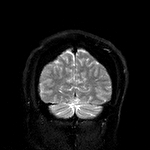
[im 60/60]
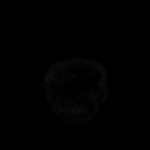

[Series 14: cor dwi_adc · coronal · 5.0mm · 1.53mm/px · 2 of 30 slices shown]
[im 1/30]
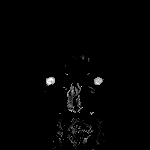
[im 30/30]
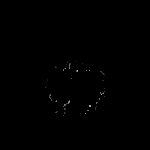

[Series 15: T2 · coronal · 5.0mm · 0.57mm/px · 3 of 36 slices shown (2 of 2)]
[im 1/36]
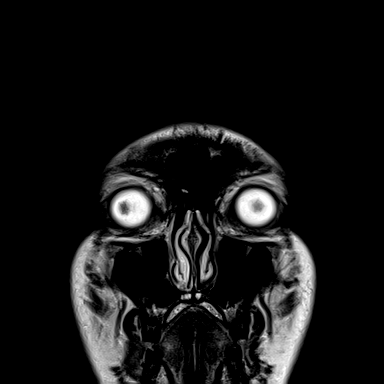
[im 18/36]
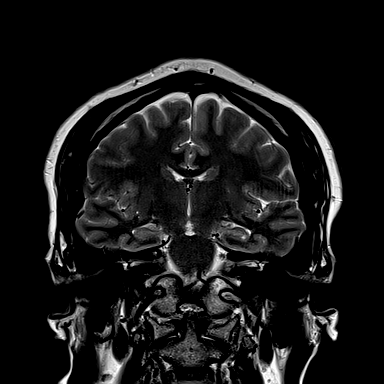
[im 36/36]
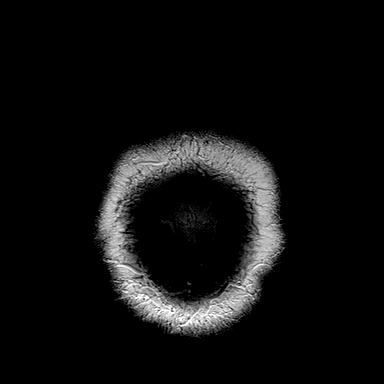

[48 of 48 positions shown; findings below may reference images not displayed]

FINDINGS: Brain: No acute infarct, mass effect or extra-axial collection. No
acute or chronic hemorrhage. Normal white matter signal, parenchymal
volume and CSF spaces. There is an intra sellar mass that measures
2.2 x 2.5 x 2.5 cm. This invades the left cavernous sinus and
encases the cavernous left internal carotid artery. There is mass
effect on the underside of the optic chiasm.

Vascular: Major flow voids are preserved.

Skull and upper cervical spine: Normal calvarium and skull base.
Visualized upper cervical spine and soft tissues are normal.

Sinuses/Orbits:No paranasal sinus fluid levels or advanced mucosal
thickening. No mastoid or middle ear effusion. Normal orbits.
IMPRESSION: Intrasellar mass measuring 2.2 x 2.5 x 2.5 cm with left cavernous
sinus invasion and mass effect on the underside of the optic chiasm.
This is most consistent with a pituitary macroadenoma, but
postcontrast pituitary sequence images should be obtained for more
complete characterization.

## 2023-07-19 MED ORDER — LOSARTAN POTASSIUM-HCTZ 50-12.5 MG PO TABS
1.0000 | ORAL_TABLET | Freq: Every day | ORAL | 1 refills | Status: DC
  Filled 2023-07-19: qty 90, 90d supply, fill #0
  Filled 2023-12-11: qty 90, 90d supply, fill #1

## 2023-07-20 ENCOUNTER — Other Ambulatory Visit (HOSPITAL_COMMUNITY): Payer: Self-pay

## 2023-07-21 ENCOUNTER — Other Ambulatory Visit (HOSPITAL_COMMUNITY): Payer: Self-pay

## 2023-07-21 MED ORDER — TESTOSTERONE 1.62 % TD GEL
60.7500 mg | Freq: Every day | TRANSDERMAL | 2 refills | Status: AC
  Filled 2023-07-21: qty 75, 30d supply, fill #0

## 2023-07-22 ENCOUNTER — Ambulatory Visit: Payer: 59 | Admitting: Clinical

## 2023-07-22 ENCOUNTER — Other Ambulatory Visit (HOSPITAL_COMMUNITY): Payer: Self-pay

## 2023-07-22 DIAGNOSIS — I10 Essential (primary) hypertension: Secondary | ICD-10-CM | POA: Diagnosis not present

## 2023-07-22 DIAGNOSIS — E782 Mixed hyperlipidemia: Secondary | ICD-10-CM | POA: Diagnosis not present

## 2023-07-22 DIAGNOSIS — F410 Panic disorder [episodic paroxysmal anxiety] without agoraphobia: Secondary | ICD-10-CM

## 2023-07-22 DIAGNOSIS — F411 Generalized anxiety disorder: Secondary | ICD-10-CM

## 2023-07-22 MED ORDER — EZETIMIBE 10 MG PO TABS
10.0000 mg | ORAL_TABLET | Freq: Every day | ORAL | 3 refills | Status: DC
  Filled 2023-07-22: qty 90, 90d supply, fill #0
  Filled 2023-10-25: qty 90, 90d supply, fill #1
  Filled 2024-01-20: qty 90, 90d supply, fill #2
  Filled 2024-04-18: qty 90, 90d supply, fill #3

## 2023-07-22 NOTE — Progress Notes (Signed)
                Dezi Schaner, LCSW 
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
## 2023-07-29 ENCOUNTER — Ambulatory Visit: Payer: 59 | Admitting: Psychology

## 2023-07-29 DIAGNOSIS — F89 Unspecified disorder of psychological development: Secondary | ICD-10-CM
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
## 2023-08-05 ENCOUNTER — Ambulatory Visit: Payer: 59 | Admitting: Clinical

## 2023-08-05 DIAGNOSIS — F410 Panic disorder [episodic paroxysmal anxiety] without agoraphobia: Secondary | ICD-10-CM | POA: Diagnosis not present

## 2023-08-05 DIAGNOSIS — I10 Essential (primary) hypertension: Secondary | ICD-10-CM | POA: Diagnosis not present

## 2023-08-05 DIAGNOSIS — D352 Benign neoplasm of pituitary gland: Secondary | ICD-10-CM | POA: Diagnosis not present

## 2023-08-05 DIAGNOSIS — E291 Testicular hypofunction: Secondary | ICD-10-CM | POA: Diagnosis not present

## 2023-08-05 DIAGNOSIS — F411 Generalized anxiety disorder: Secondary | ICD-10-CM | POA: Diagnosis not present

## 2023-08-05 NOTE — Progress Notes (Signed)
                Dezi Schaner, LCSW 

## 2023-08-05 NOTE — Progress Notes (Signed)
[facility] Counselor/Therapist Progress Note  Patient ID: Wilkins Mance, MRN: [id number],    Date: 08/05/2023  Time Spent: 2:31pm- 3:19pm : 48 minutes   Treatment Type: Individual Therapy  Reported Symptoms: Patient reported a decrease in symptoms of anxiety and feelings of anger  Mental Status Exam: Appearance:  Neat and Well Groomed     Behavior: Appropriate  Motor: Normal  Speech/Language:  Clear and Coherent  Affect: Appropriate  Mood: normal  Thought process: normal  Thought content:   WNL  Sensory/Perceptual disturbances:   WNL  Orientation: oriented to person, place, and situation  Attention: Good  Concentration: Good  Memory: WNL  Fund of knowledge:  Good  Insight:   Good  Judgment:  Good  Impulse Control: Good   Risk Assessment: Danger to Self:  No Patient denied current suicidal ideation  Self-injurious Behavior: No Danger to Others: No Patient denied current homicidal ideation Duty to Warn:no Physical Aggression / Violence:No  Access to Firearms a concern: No  Gang Involvement:No   Subjective: Patient reported he received a response from the dealership and patient's vehicle is currently being repaired. Patient reported the dealership is reimbursing patient for the rental car patient obtained and is providing a loaner vehicle until the repairs on patient's vehicle are complete.  Patient reported the dealership is replacing patient's engine. Patient stated, "I'm feeling a whole lot better" in response to status of stressor. Patient stated, "after that hiccup everything's been going great". Patient stated, "I'm actually doing really good" in response to patient's mood. Patient reported he finds listening to classical music calming. Patient reported he enjoys looking at the stars and reported he recently downloaded an adult coloring book he enjoys. Patient stated, "surprisingly I hadn't" in response to documenting in patient's thought record. Patient stated,  "I have seen an improvement in my mood", "my wife said I have been less angry". Patient stated, "that's going to take an act of the almighty to keep me from cussing". Patient stated, "I try to curb it" in regard to cursing in patient's children's presence. Patient stated, "some days are better than others" in response to symptoms of anxiety and feelings of anger. Patient stated, "surprisingly I haven't had as many" in reference to panic attacks.  Interventions: Cognitive Behavioral Therapy. Clinician conducted session in person at clinician's office at [facility]. Discussed the status of recent stressors and patient's thoughts/feelings in response. Assessed patient's mood since last session and current mood. Explored activities patient finds calming and discussed incorporating calming activities (listening to classical music, looking at the stars, coloring) as coping strategies to utilize in response to anger and anxiety. Reviewed patient's thought record. Provided psycho education related to gratitude exercises. Discussed patient's progress and assessed the intensity/frequency of anger/anxiety. Clinician requested for homework patient continue thought record, practice identifying evidence for/against thoughts, and identify 3 positives each day.    Collaboration of Care: Other not required at this time   Diagnosis:  Generalized anxiety disorder with panic attacks R/O ADHD     Plan: Patient is to utilize Cognitive Behavioral Therapy, thought re-framing, relaxation techniques, healthy communication strategies, and coping strategies to decrease symptoms associated with their diagnosis. Frequency: bi-weekly  Modality: individual      Long-term goal:   Reduce overall level, frequency, and intensity of the feelings of anxiety and anger as evidenced by decrease in panic attacks, irritability, easily agitated, fluctuation in appetite, difficulty falling asleep and staying asleep, difficulty  concentrating, feeling on edge, restlessness,  worry,  difficulty controlling the worry, easily distracted, "always fidgety", difficulty completing tasks, difficulty with organization, and difficulty staying on tasks from 7 days/week to 0 to 1 days/week per patient report for at least 3 consecutive months. Target Date: 05/05/24  Progress: progressing    Short-term goal:  Identify triggers for anger/irritability Target Date: 11/03/23  Progress: progressing    develop and implement coping strategies to utilize in response to feelings of anger/irritability to reduce anger outbursts per patient's report  Target Date: 11/03/23  Progress: progressing    Develop and implement healthy communication strategies for patient to utilize when expressing his thoughts and feelings to others in a controlled and assertive way  Target Date: 11/03/23  Progress: progressing    Identify, challenge, and replace negative core beliefs, thought patterns, and negative self talk that contribute to feelings of anger, anxiety, and low self confidence with positive thoughts, beliefs, and positive self talk per patient's report Target Date: 11/03/23  Progress: progressing    Develop, establish, and maintain healthy boundaries with others  Target Date: 11/03/23  Progress: progressing       Doree Barthel, LCSW

## 2023-08-06 NOTE — Progress Notes (Unsigned)
Date: 08/12/2023   Appointment Start Time: 5:02pm Duration: 90 minutes Provider: Helmut Muster, PsyD Type of Session: Testing Appointment for Evaluation  Location of Patient: Home Location of Provider: Provider's Home (private office) Type of Contact: Caregility video visit with audio  Session Content: Today's appointment was a telepsychological visit. Joel Marshall is aware it is his responsibility to secure confidentiality on his end of the session. Prior to proceeding with today's appointment, Joel Marshall's physical location at the time of this appointment was obtained as well a phone number he could be reached at in the event of technical difficulties. Joel Marshall denied anyone else being present in the room or on the virtual appointment. This provider reviewed that video should not be captured, photos should not be taken, nor should testing stimuli be copied or recorded as it would be a copyright violation. Joel Marshall expressed understanding of the aforementioned, and verbally consented to proceed. The WAIS-IV and MDQ were administered, scored, and interpreted by this evaluator. Joel Marshall is aware of the limitations of teleheath visits and verbally consented to proceed.  Billing codes will be input on the feedback appointment. There are no billing codes for the testing appointment.   Provisional DSM-5 diagnosis(es):  F89 Unspecified Disorder of Psychological Development   Plan: Joel Marshall was scheduled for a feedback appointment on 08/24/2022 at 4pm via Caregility video visit with audio.                Margarite Gouge, PsyD

## 2023-08-12 ENCOUNTER — Ambulatory Visit: Payer: 59 | Admitting: Psychology

## 2023-08-12 ENCOUNTER — Other Ambulatory Visit: Payer: Self-pay

## 2023-08-12 ENCOUNTER — Other Ambulatory Visit (HOSPITAL_COMMUNITY): Payer: Self-pay

## 2023-08-12 DIAGNOSIS — N35912 Unspecified bulbous urethral stricture, male: Secondary | ICD-10-CM | POA: Diagnosis not present

## 2023-08-12 DIAGNOSIS — N35919 Unspecified urethral stricture, male, unspecified site: Secondary | ICD-10-CM | POA: Diagnosis not present

## 2023-08-12 DIAGNOSIS — F89 Unspecified disorder of psychological development: Secondary | ICD-10-CM

## 2023-08-26 ENCOUNTER — Ambulatory Visit (INDEPENDENT_AMBULATORY_CARE_PROVIDER_SITE_OTHER): Payer: 59 | Admitting: Psychology

## 2023-08-26 ENCOUNTER — Ambulatory Visit (INDEPENDENT_AMBULATORY_CARE_PROVIDER_SITE_OTHER): Payer: 59 | Admitting: Clinical

## 2023-08-26 DIAGNOSIS — F902 Attention-deficit hyperactivity disorder, combined type: Secondary | ICD-10-CM

## 2023-08-26 DIAGNOSIS — F411 Generalized anxiety disorder: Secondary | ICD-10-CM | POA: Diagnosis not present

## 2023-08-26 NOTE — Progress Notes (Signed)
 [facility] Counselor/Therapist Progress Note  Patient ID: Joel Marshall, MRN: [id number],    Date: 08/26/2023  Time Spent: 2:35pm - 3:21pm : 46 minutes   Treatment Type: Individual Therapy  Reported Symptoms: Patient reported recent feelings of frustration and anger  Mental Status Exam: Appearance:  Neat and Well Groomed     Behavior: Appropriate  Motor: Normal  Speech/Language:  Clear and Coherent  Affect: Appropriate  Mood: Patient stated, today I'm here, not happy, not sad, not angry  Thought process: normal  Thought content:   WNL  Sensory/Perceptual disturbances:   WNL  Orientation: oriented to person, place, and situation  Attention: Good  Concentration: Good  Memory: WNL  Fund of knowledge:  Good  Insight:   Good  Judgment:  Good  Impulse Control: Good   Risk Assessment: Danger to Self:  No Patient denied current suicidal ideation  Self-injurious Behavior: No Danger to Others: No Patient denied current homicidal ideation Duty to Warn:no Physical Aggression / Violence:No  Access to Firearms a concern: No  Gang Involvement:No   Subjective: Patient stated, it was a good Christmas and stated, I had quite the lengthy conversation with my mom in response to events since last session. Patient reported patient's brother read patient's book while he was visiting and shared the information with patient's mother. Patient reported his brother's disclosure of the book to patient's mother prompted the conversation with his mother. Patient stated, kind of cathartic, kind of therapeutic in response to conversation with patient's mother. Patient stated, aside from my son still having the occasional accident because he's not wanting to stop playing or playing video games  and reported son's accidents are triggers for changes in patient's mood. Patient reported feeling somewhere between a cross of frustration, anger, with a dash of disappointment in response to  son's accidents. Patient reported his son is wetting his clothes while at the after school program and at home.  Patient reported patient did not have experience being around children until his children were born. Patient stated, today I'm here, not happy not sad not angry in response to current mood. Patient stated, some days are better than others in reference to frequency/intensity of anger. Patient reported feeling grateful for being on this side of earth and not in it, stated I'm grateful for my kids, grateful for his wife, grateful for his job, food on the table, starting to mend stuff with my mom now, grateful his brother's visit and grateful his brother enjoyed his book.   Interventions: Cognitive Behavioral Therapy. Clinician conducted session in person at clinician's office at [facility]. Reviewed events since last session. Discussed patient's conversion with his mother and patient's thoughts/feelings in response. Assessed patient's mood since last session and current mood. Provided psycho education related to age appropriate behaviors, communication, and expectations as it relates to children. Discussed patient initiating a conversation with son's pediatrician to discuss patient's concerns. Assessed intensity and frequency of anger. Reviewed patient's thought record. Reviewed patient's homework to identify 3 positives each day. Challenged thoughts/statements to assist patient in reframing situations. Clinician requested for homework patient continue thought record, practice identifying evidence for/against thoughts, and identify 3 positives each day.   Collaboration of Care: Other not required at this time   Diagnosis:  Generalized anxiety disorder with panic attacks R/O ADHD     Plan: Patient is to utilize Cognitive Behavioral Therapy, thought re-framing, relaxation techniques, healthy communication strategies, and coping strategies to decrease symptoms associated with  their diagnosis. Frequency:  bi-weekly  Modality: individual      Long-term goal:   Reduce overall level, frequency, and intensity of the feelings of anxiety and anger as evidenced by decrease in panic attacks, irritability, easily agitated, fluctuation in appetite, difficulty falling asleep and staying asleep, difficulty concentrating, feeling on edge, restlessness,  worry, difficulty controlling the worry, easily distracted, always fidgety, difficulty completing tasks, difficulty with organization, and difficulty staying on tasks from 7 days/week to 0 to 1 days/week per patient report for at least 3 consecutive months. Target Date: 05/05/24  Progress: progressing    Short-term goal:  Identify triggers for anger/irritability Target Date: 11/03/23  Progress: progressing    develop and implement coping strategies to utilize in response to feelings of anger/irritability to reduce anger outbursts per patient's report  Target Date: 11/03/23  Progress: progressing    Develop and implement healthy communication strategies for patient to utilize when expressing his thoughts and feelings to others in a controlled and assertive way  Target Date: 11/03/23  Progress: progressing    Identify, challenge, and replace negative core beliefs, thought patterns, and negative self talk that contribute to feelings of anger, anxiety, and low self confidence with positive thoughts, beliefs, and positive self talk per patient's report Target Date: 11/03/23  Progress: progressing    Develop, establish, and maintain healthy boundaries with others  Target Date: 11/03/23  Progress: progressing    Darice Seats, LCSW

## 2023-08-26 NOTE — Progress Notes (Signed)
   Joel Barthel, LCSW

## 2023-08-26 NOTE — Progress Notes (Addendum)
 Pt needing to be rescheduled. Encounter created in an error.                 Margarite Gouge, PsyD

## 2023-09-01 ENCOUNTER — Other Ambulatory Visit (HOSPITAL_COMMUNITY): Payer: Self-pay

## 2023-09-02 ENCOUNTER — Other Ambulatory Visit (HOSPITAL_COMMUNITY): Payer: Self-pay

## 2023-09-02 MED ORDER — TESTOSTERONE 1.62 % TD GEL
TRANSDERMAL | 2 refills | Status: AC
  Filled 2023-09-02: qty 75, 20d supply, fill #0

## 2023-09-03 ENCOUNTER — Ambulatory Visit (INDEPENDENT_AMBULATORY_CARE_PROVIDER_SITE_OTHER): Payer: 59 | Admitting: Psychology

## 2023-09-03 DIAGNOSIS — F902 Attention-deficit hyperactivity disorder, combined type: Secondary | ICD-10-CM

## 2023-09-03 NOTE — Progress Notes (Signed)
Testing and Report Writing Information: The following measures  were administered, scored, and interpreted by this provider:  Generalized Anxiety Disorder-7 (GAD-7; 5 minutes), Patient Health Questionnaire-9 (PHQ-9; 5 minutes), Wechsler Adult Intelligence Scale-Fourth Edition (WAIS-5; 70 minutes), CNS Vital Signs (45 minutes), Adult Attention Deficit/Hyperactivity Disorder Self-Report Scale Checklist (ASRSv1.1; 15 minutes), Mood Disorder Questionnaire (MDQ; 15 minutes) Adult OCD Inventory (OCD-A) SF-20 (15 minutes), Behavior Rating Inventory for Executive Function - A - Self Report (BRIEF A; 10 minutes), and Behavior Rating Inventory for Executive Function - A - Informant  (BRIEF-A; 10 minutes), and Personality Assessment Inventory (PAI; 50 minutes). A total of 240 minutes was spent on the administration and scoring of the aforementioned measures. Codes 44010 and [id number] (7 units) were billed.  Please see the assessment for additional details. This provider completed the written report which includes integration of patient data, interpretation of standardized test results, interpretation of clinical data, review of information provided by Jonny Ruiz and any collateral information/documentation, and clinical decision making (300 minutes in total).  Feedback Appointment: Date: 09/03/2023 Appointment Start Time: 4pm Duration: 60 minutes Provider: Helmut Muster, PsyD Type of Session: Feedback Appointment for Evaluation  Location of Patient: Home Location of Provider: Provider's Home (private office) Type of Contact: Caregility video visit with audio  Session Content: Today's appointment was a telepsychological visit due to COVID-19. Keelen is aware it is his responsibility to secure confidentiality on his end of the session. He provided verbal consent to proceed with today's appointment. Prior to proceeding with today's appointment, Raunel's physical location at the time of this appointment was obtained as well a  phone number he could be reached at in the event of technical difficulties. Nizar denied anyone else being present in the room or on the virtual appointment. Horus is aware of the limitations of teleheath visits and verbally consented to proceed.  This provider and Jonny Ruiz completed the interactive feedback session which includes reviewing the aforementioned measures, treatment recommendations, and diagnostic conclusions.   The interactive feedback session was completed today and a total of 60 minutes was spent on feedback. Code 66440 was billed for feedback session.   DSM-5 Diagnosis(es):  F90.2 Attention-Deficit/Hyperactivity Disorder, Combined Presentation, Moderate   Time Requirements: Assessment scoring and interpreting: 240 minutes (billing code 34742 and [phone] [7 units]) Feedback: 60 minutes (billing code 87564) Report writing: 300 total minutes. 07/23/2023: 10:15-10:30am (inputting chart review information into the evaluation). 08/02/2023: 10-10:10am, 4:25-4:40pm, 5:55-6:25pm, and 8:45-9pm. 08/03/2023: 11:50-12:20pm. 08/12/2023: 6:45-7:10pm. 08/13/2023: 6-7:05pm. 08/24/2022: 6:25-6:55pm. 08/25/2022: 9-9:45am. 11:55-12:15pm (billing code 33295 [5 units])  Plan: Jonny Ruiz provided verbal consent for his evaluation to be sent via e-mail. No further follow-up planned by this provider.        CONFIDENTIAL PSYCHOLOGICAL EVALUATION ______________________________________________________________________________ Name: Vida Rigger Date of Birth: [birth year]    Age: 43 Dates of Evaluation: 07/29/2023, 08/10/2024, 08/12/2023, and 08/24/2023  SOURCE AND REASON FOR REFERRAL: Mr. Onofrio Tessendorf was referred by Doree Barthel, LCSW, for an evaluation to ascertain if he meets the criteria for Attention Deficit/Hyperactivity Disorder (ADHD).   EVALUATIVE PROCEDURES: Clinical Interview with Mr. Kamari Kopack (07/29/2023) Wechsler Adult Intelligence Scale-Fourth Edition (WAIS-IV; 08/12/2023) CNS Vital Signs (08/23/2022) Adult  Attention Deficit/Hyperactivity Disorder Self-Report Scale Checklist (08/23/2022) Behavior Rating Inventory for Executive Function - A - Self Report Behavior Rating Inventory for Executive Function - 2A - Self Report (BRIEF-2A; 08/11/2023) and Informant (08/11/2023) Personality Assessment Inventory (PAI; 08/11/2023) Patient Health Questionnaire-9 (PHQ-9) Generalized Anxiety Disorder-7 (GAD-7) Mood Disorder Questionnaire (MDQ; 08/12/2023) Adult OCD Inventory (OCD-A) SF-20 (08/24/2023)   BACKGROUND  INFORMATION AND PRESENTING PROBLEM: Mr. Lake Schnitker is a 43 year old male who resides in [place].  Mr. Tina reported "major issues with some anxiety" and concerns he "might have ADHD," adding that he has experienced ADHD-related symptoms since childhood and his son and daughter exhibit some of the symptoms of ADHD and/or Autism.  Symptoms Frequency   Often Occasionally Infrequent/No Significant Issues  Inattention Criterion (DSM-5-TR)     Making careless mistakes and missing small details.  He reported this commonly occurs and inattention contributes.    Being easily distracted by various stimuli and the mind often being elsewhere even when no apparent distractions exist.  He shared he is commonly distracted by various stimuli (e.g., sounds, movement, and thoughts) and that his mind is often elsewhere even when there are no obvious distractions.    Trouble sustaining attention during conversations.   He noted this difficulty occurs occasionally but happens more frequently when in group social settings.     Does not follow through on instructions and fails to finish tasks.  He described that inattention and "hyperfocus" both negatively impact his ability to complete tasks.    Difficulty organizing tasks and activities.  He reported he commonly has clutter in his environment as well as difficulty prioritizing and determining the required steps to tasks.     Avoids, dislikes, or is reluctant to engage in  tasks that require sustained mental effort. He stated he "tend[s] to procrastinate," and that time blindness, planning problems, concerns about potential obstacles, and being a "bit of a perfectionist" contribute.    Loses things necessary for tasks or activities.  He shared that he regularly misplaces items.    Forgetful in daily activities.  He noted he often forgets what he wants to say and items he needs for tasks.     Hyperactivity and Impulsivity Criterion (DSM-5-TR)     Fidgets with hands or feet or squirms in seat. He discussed that he regularly fidgets which can lead to "plucking hair" on his face, although added "mostly when anxious."    Leaves seat in situations in which remaining seated is expected.  He stated when anxious he is prone to "get[ting] up and pac[ing]."   Feelings of restlessness. He indicated he often feels restless.     Difficulty playing or engaging in leisure activities quietly.   He denied this symptom occurs frequently or causes significant issues.  "On the go" or often acts as if "driven by a motor." He reported this commonly occurs.     Talks excessively.    He denied this symptom occurs frequently or causes significant issues.  Interrupts others.  He reported he is prone to interrupting others, which he attributed to being excited about the topic and/or concerns he will forget what he wants to say.    Impatience.  He reported he "hate[s] waiting in line" and that others have commented on his impatience.     ADHD-Related Symptoms that Assist in Identifying ADHD but are not in the DSM-5-TR Laureen Ochs, 2021, p. 6-12 and [phone])     Emotional dysregulation and overstimulation. He noted he is prone to irritation, tends to speak loudly when "excited" or "upset," becoming overstimulated in "busy" environments," experiencing significant distress upon experiencing obstacles to his plans, and becoming upset if others are perceived as doing something erroneously or when required  to wait.    Making decisions impulsively. He stated he is prone to "impulse buying" and saying things he later regrets, although noted he has  reduced impulsive spending by engaging in efforts to pause and ask himself "Do I really need this?"    Tending to drive much faster than others. He discussed having a history of driving much faster than others, which resulted in multiple speeding tickets. He further discussed how the speeding tickets resulted in him reducing his speeding.     Trouble following through on promises or commitments made to others.  He shared that he is prone to forgetting promises or commitments he has made, which frequently leads to him doing them "last minute." He further shared that inattention contributes.     Trouble doing things in proper order or sequence.  He reported this symptom occasionally occurs.    Mr. Ptak discussed a history of misophonia-related symptoms (e.g., experiencing significant distress upon hearing "whining" and "nails on a chalkboard"); periods of low mood, irritability, social withdrawal, and anhedonia; two periods of hypomania- or mania-related symptoms (e.g., elevated mood and energy as well as impulsivity) that he attributed to "things were going well" for him; generalized anxiety; obsessive-compulsive disorder-related symptomatology (e.g., "lik[ing] to do things in three," "constantly" washing his hands, and liking things to be in just a certain way); regular sleep onset issues; and being diagnosed with sleep apnea in 2018, although he noted a belief his obstructive sleep apnea-related symptoms may have started in 2011 or 2012 after he "put on weight." He acknowledged he no longer uses his CPAP despite ongoing sleep apnea symptoms. Mr. Ory expressed a belief his ADHD-related symptoms are consistent and independent of mood. However, he noted disorganization, inattention, and restlessness exacerbate and "fuel" anxiety.  Mr. Legner denied awareness of having ever  experienced any developmental milestone delays, grade retention, learning disability diagnosis, or having an individualized education plan. He stated he was "academically gifted," was in "honors" classes, and primarily obtained "As." He further said he was prone to becoming bored in class, would wait until the "last minute" to start and complete tasks, and would doodle as a form of stimulation and fidgeting, but noted teachers commented on it. He discussed in college he would experience task initiation issues and indicated if he was not interested in the subject his grades tended to be lower, as well as that after family members passed, his "depression" made these issues worse as he "stopped caring about everything." Mr. 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 He also described his employment disciplinary history as significant for being written up for tardiness at a previous employment and "messing up one of [his] projects" at his current employment.   Mr. Merlan reported his medical history is significant for a pituitary tumor that was identified during an MRI in March of 2023, although he expressed a belief it may have been present as far back as 2011 as he was experiencing "discharge" from his nipples at that time; "vision issues" that he attributed to the pituitary tumor; "scarring" in his urethra; and hypertension. He shared he is currently utilizing medication for the pituitary tumor and hypertension. He noted he "fell and hit [his] head on the corner of the refrigerator" in 2006 but did not receive medical attention for it. He denied awareness of experiencing any lingering effects from the head injury. Mr. Flasher reported he is currently utilizing psychotherapeutic services for "anxiety" and "some anger issues. He  noted past  problematic alcohol use during high school, adding his current alcohol use is having one standard size drink of alcohol "here and there to celebrate." He shared he consumes 12oz of coffee daily. He denied the use of all other recreational and illicit substances. He also denied ever experiencing psychiatric hospitalization or ever meeting the full criteria for autism spectrum disorder; eating disorder; psychosis; trauma- and stressor-related disorder; suicidal and homicidal ideation, plan, or intent; or legal involvement. He stated his familial mental health history is significant for possible ADHD and/or autism (children and various family members), as well as having experienced a "nervous breakdown" and psychiatric hospitalization (mother).   Chart Review: Various notes by Doree Barthel, LCSW, indicate Mr. Ferryman was reporting multiple ADHD-related concerns (e.g., "always fidgety," "difficulty with organization," and "short attention span"), and recommendations were given that he pursue an ADHD evaluation. A diagnosis of Generalized anxiety disorder with panic attacks and a rule-out diagnosis of "ADHD" was given.   BEHAVIORAL OBSERVATIONS: Mr. Roosevelt presented on time for the evaluation. He was well-groomed. He was oriented to the time, place, person, and purpose of the appointment. During the interview, Mr. Amari was regularly tangential and would occasionally interrupt this evaluator. During the evaluation, Mr. Bursey verbalized and/or demonstrated occasional self-expression difficulties (e.g., providing lengthy answers to verbal comprehension index items), working memory-related problems (e.g., repeated verbally provided information to himself out loud as he attempted to process it and perform required calculations, indicating he had abruptly lost the verbally provided digits he was attempting to retain, and closing his eyes in an effort to limit distractors), impulsivity (e.g., making irrelevant  comments to his answers to a test item), and occasional long-term memory retrieval issues (e.g., indicating he knew an answer but was "drawing a blank"). Throughout the evaluation, he maintained appropriate eye contact. His thought processes and content were logical, coherent, and goal-directed. There were no overt signs of a thought disorder or perceptual disturbances, nor did he report such symptomatology. There was no evidence of paraphasias (i.e., errors in speech, gross mispronunciations, and word substitutions), repetition deficits, or disturbances in volume or prosody (i.e., rhythm and intonation). Overall, based on Mr. Tatom approach to testing, the current results are believed to be a fair estimate of his abilities.  PROCEDURAL CONSIDERATIONS:  Psychological testing measures were conducted through a virtual visit with video and audio capabilities, but otherwise in a standard manner.   The Wechsler Adult Intelligence Scale, Fourth Edition (WAIS-IV) was administered via remote telepractice using digital stimulus materials on [employer]'s Q-global system. The remote testing environment appeared free of distractions, adequate rapport was established with the examinee via video/audio capabilities, and Mr. Cichowski appeared appropriately engaged in the task throughout the session. No significant technological problems or distractions were noted during administration. Modifications to the standardization procedure included: none. The WAIS-IV subtests, or similar tasks, have received initial validation in several samples for remote telepractice and digital format administration, and the results are considered a valid description of Mr. Blumenstock skills and abilities.  CLINICAL FINDINGS:  COGNITIVE FUNCTIONING  Wechsler Adult Intelligence Scale, Fourth Edition (WAIS-IV):  Mr. Hanninen completed subtests of the WAIS-IV, a full-scale measure of cognitive ability. The WAIS-IV comprises four indices that measure cognitive  processes that are components of intellectual ability; however, only subtests from the Verbal Comprehension and Working Memory indices were administered. As a result, Full-Scale-IQ (FSIQ) and General Ability Index (GAI) could not be determined.   WAIS-IV Scale/Subtest IQ/Scaled Score 95% Confidence Interval Percentile Rank Qualitative Description  Verbal Comprehension (  VCI) 122 115-127 93 Superior  Similarities 13     Vocabulary 12     Information 17     Working Memory (WMI) 122 114-127 93 Superior  Digit Span 13     Arithmetic 15       The Verbal Comprehension Index (VCI) measures one's ability to receive, comprehend, and express language. It also measures the ability to retrieve previously learned information and to understand relationships between words and concepts presented orally. Mr. Kinas obtained a VCI score of 122 (93rd percentile), placing him in the superior range compared to same-aged peers. His performance on the subtests comprising this index was diverse. Out of the three subtests, Mr. Macfadyen demonstrated the strongest performance on the Information subtest is primarily a measure of his fund of general knowledge but may also be influenced by cultural experience, quality of education, and ability to retrieve information from long-term memory. The Vocabulary subtest required him to explain the meaning of words presented in isolation. Additionally, performance on this subtest requires abilities to verbalize meaningful concepts, as well as retrieve information from long-term memory.     The Working Memory Index (WMI) measures one's ability to sustain attention, concentrate, and exert mental control. Mr. Fargnoli obtained a WMI score of 122 (93rd percentile), placing him in the superior range compared to same-aged peers. He demonstrated similar performance on the subtests comprising this index. However, his Digit Span performance was unusual as he was able to recall longer digits backwards than  forwards. It is unclear what caused this performance, although it may be at least partially explained by Mr. Tartaglione indication that he struggles to sustain his attention on boring or repetitive tasks. Upon follow-up, Mr. Wilks reported he tends to "excel" at tasks that are more challenging versus tasks he perceives as boring or repetitive.   ATTENTION AND PROCESSING  CNS Vital Signs: The CNS Vital Signs assessment evaluates the neurocognitive status of an individual and covers a range of mental processes. The results of the CNS Vital Signs testing indicated above neurocognitive processing ability. Attentional abilities ranged from the average to above range. Executive function, working memory, and cognitive flexibility were comparable, although working memory was in the average range, and executive function and cognitive flexibility were in the above range. Psychomotor speed, motor speed, reaction time, and processing speed were above, indicating strong hand-eye coordination, thinking speed, and responsiveness. Visual memory (images) and verbal memory (words) were above, which implies they are comparably developed. The results suggest Mr. Hergert experiences strengths in Management consultant, visual memory, psychomotor speed, reaction time, complex attention, cognitive flexibility, processing speed, executive function, sustained attention, and motor speed. Upon follow-up, Mr. Runnells described the test as becoming fatiguing towards the end and that he increasingly began trying to anticipate the next test item.   Domain  Standard Score Percentile Validity Indicator Guideline  Neurocognitive Index 121 92 Yes Above  Composite Memory 127 96 Yes Above  Verbal Memory 119 90 Yes Above  Visual Memory 125 95 Yes Above  Psychomotor Speed 137 99 Yes Above  Reaction Time 114 82 Yes Above  Complex Attention 111 77 Yes Above  Cognitive Flexibility 117 87 Yes Above  Processing Speed  124 95 Yes Above  Executive Function 117 87  Yes Above  Working Memory 109 73 Yes Average  Sustained Attention 112 79 Yes Above  Simple Attention 106 66 Yes Average  Motor Speed 132 98 Yes Above   EXECUTIVE FUNCTION  Behavior Rating Inventory of Executive Function, Second Edition (  BRIEF-2A) Self-Report:  Mr. Felton completed the Self-Report Form of the Behavior Rating Inventory of Executive Function-Adult Version, Second Edition [employer]), which has three domains that evaluate cognitive, behavioral, and emotional regulation, and a Global Executive Composite score provides an overall snapshot of executive functioning. There are no missing item responses in the protocol.  The Negativity, Infrequency, and Inconsistency scales are not elevated, suggesting he did not respond to the protocol in an overly negative, haphazard, extreme, or inconsistent manner. In the context of these validity considerations, ratings of Mr. Lounsberry everyday executive function suggest some areas of concern. The overall index score, the GEC, was moderately elevated (GEC T = 70, %ile = 97). The Behavior Regulation Index (BRI), Emotion Regulation Index (ERI), and Cognitive Regulation Index (CRI) scores were all elevated (BRI T = 67, %ile = 95; ERI T = 76, %ile = 99, CRI T = 67, %ile = 95), suggesting self-regulatory problems in multiple domains. Mr. Greear indicated difficulty with his ability to resist impulses, be aware of his functioning in social settings, adjust well to changes, react to events appropriately, get going on tasks and activities and independently generate ideas, plan and organize his approach to problem solving appropriately, and be appropriately cautious in his approach to tasks and check for mistakes. He did not describe his ability to sustain working memory and keep materials and belongings reasonably well-organized as problematic, although the Working Memory scale approached an abnormal elevation. The elevated scores on the Shift and Emotional Control scales  suggest he experiences significant problem-solving rigidity combined with emotional dysregulation, which may leave him prone to losing emotional control when her routine or perspective is challenged and/or flexibility is required. Moreover, the elevated scores on scales reflecting problems with fundamental behavioral and/or emotional regulation (Inhibit, Emotional Control, and Shift) suggest that self-regulation problems are likely negatively impacting his active cognitive problem solving (elevated CRI).   Scale/Index  Raw Score T Score Percentile Qualitative Description  Inhibit 14 65 95 Mildly Elevated  Self-Monitor 11 65 94 Mildly Elevated  Behavior Regulation Index (BRI) 25 67 95 Mildly Elevated  Shift 13 71 98 Moderately Elevated  Emotional Control 18 74 97 Moderately Elevated  Emotion Regulation Index (ERI) 31 76 99 Highly Elevated  Initiate 16 67 95 Mildly Elevated  Working Memory 14 64 90 Approaching an Elevation  Plan/Organize 18 74 99 Moderately Elevated  Task Monitor 12 68 99 Mildly Elevated  Organization of Materials 12 54 76 Within Normal Limits  Cognitive Regulation Index (CRI) 72 67 95 Mildly Elevated  Global Executive Composite (GEC) 128 70 97 Moderately Elevated   Validity Scale Raw Score Cumulative Percentile Protocol Classification  Inconsistency 3 98 Acceptable  Negativity 0 98 Acceptable  Infrequency 0 98 Acceptable   Behavior Rating Inventory of Executive Function, Second Edition Event organiser) Informant:  Mr. Miraglia partner, Lake Schnitker, completed the Informant Form of the Behavior Rating Inventory of Executive Function-Adult Version, Second Edition [employer]), which is equivalent to the Self-Report version and has three domains that evaluate cognitive, behavioral, and emotional regulation, and a Global Executive Composite score provides an overall snapshot of executive functioning. There are no missing item responses in the protocol. The Negativity, Infrequency, and  Inconsistency scales are not elevated, suggesting they did not respond to the protocol in an overly negative, haphazard, extreme, or inconsistent manner. In the context of these validity considerations, Georgette's ratings of Mr. Berardo everyday executive function suggest some areas of concern. The overall index score, the GEC, was mildly elevated (GEC T =  69, %ile = 97). The Behavior Regulation Index (BRI) score was within normal limits (BRI T = 61, %ile = 86), but the Emotion Regulation Index (ERI) score was moderately elevated (ERI T = 70, %ile = 95) and the Cognitive Regulation Index (CRI) score was mildly elevated (CRI T = 68, %ile = 95). Arita Miss indicated Mr. Abad experiences difficulty with his ability to react to events appropriately, get going on tasks and activities and independently generate ideas, sustain working memory, and plan and organize his approach to problem solving appropriately. His ability to resist impulses, be aware of his functioning in social settings, adjust well to changes, be appropriately cautious in his approach to tasks and check for mistakes, and keep materials and belongings reasonably well-organized was not described as problematic. The elevated scores on the Plan/Organize, Working Memory, and Initiate scales suggest a disorganized approach to solving problems and resulting in becoming overwhelmed with complex task demands. This may subsequently lead to Mr. Markwell being prone to shutting down and demonstrating task initiation difficulties. Moreover, the elevated scores on the Inhibit, Emotional Control, and Shift scales suggest self-regulation problems are likely negatively impacting his ability to engage in active cognitive problem solving (elevated CRI). Upon follow-up, Mr. Waltman stated he is his "own toughest critic" but that he believes Lance Coon may have the more accurate perception of his working memory-related abilities.    Scale/Index  Raw Score T Score Percentile  Qualitative Description  Inhibit 14 63 92 Approaching an Elevation  Self-Monitor 9 55 76 Within Normal Limits  Behavior Regulation Index (BRI) 23 61 86 Approaching an Elevation  Shift 11 60 92 Approaching an Elevation  Emotional Control 19 71 95 Moderately Elevated  Emotion Regulation Index (ERI) 30 70 95 Moderately Elevated  Initiate 16 68 98 Mildly Elevated  Working Memory 18 76 >99 Highly Elevated  Plan/Organize 15 65 94 Mildly Elevated  Task-Monitor 9 55 82 Within Normal Limits  Organization of Materials 14 58 86 Within Normal Limits  Cognitive Regulation Index (CRI) 72 68 95 Mildly Elevated  Global Executive Composite (GEC) 125 69 97 Mildly Elevated   Validity Scale Raw Score Cumulative Percentile Protocol Classification  Inconsistency 4 99 Acceptable  Negativity 1 98 Acceptable  Infrequency 0 98 Acceptable   BEHAVIORAL FUNCTIONING   Patient Health Questionnaire-9 (PHQ-9): Mr. Volner completed the PHQ-9, a self-report measure that assesses symptoms of depression. He scored 2/27, which indicates minimal depression.   Generalized Anxiety Disorder-7 (GAD-7): Mr. Foth completed the GAD-7, a self-report measure that assesses symptoms of anxiety. He scored 3/21, which indicates minimal anxiety.   Adult ADHD Self-Report Scale Symptom Checklist (ASRS): Mr. Frisque reported the following symptoms as sometimes: problems remembering appointments or obligations, feeling overly active and compelled to do things, making careless mistakes when working on boring or complex projects, struggling to concentrate on what people say even when they are speaking directly to him, misplacing or has difficulty finding things, feeling restless or fidgety, and difficulty waiting for his turn in turn-taking situations. He endorsed the following symptoms as occurring often: difficulty wrapping up the final details of a project following the completion of challenging aspects, difficulty getting things in order when a  task requires organization, fidgeting or squirming, being distracted by the noise around him, difficulty relaxing, talking too much in social situations, and interrupting others or finishing their sentences. He endorsed the following symptoms as very often: avoiding or delaying getting started on tasks requiring a lot of thought and struggling to sustain attention  when doing boring or repetitive work. The endorsement of at least four items in Part A is highly consistent with ADHD in adults. The frequency scores of Part B provide additional cues. Mr. Bellar scored a 5/6 on Part A and 6/12 on Part B, which is considered a positive screening for ADHD.   Adult OCD Inventory (OCD-A) SF-20: The OCD-A SF-20 was administered. Mr. Verbeke scored 95/300, which is indicative of mild problems with OCD-related concerns. He endorsed the following as "Yes, this stops me a little or wastes a little of my time": having a special number, worrying about being clean, washing his hands over and over, arranging things in certain ways or symmetrically, having thoughts or words repeat themselves over and over in his mind, having to put things away just right, getting angry if other people mess up his desk or work area, liking to eat the same foods, having to check things several times, hating dirt or dirty things, if something is touched by someone else being unwilling to touch it, feeling guilty over minor infractions, and worrying about the germs that are on things. He endorsed the following as "Yes, this stops me from doing other things or wastes some of my time": spending more time than needed to check his work, having trouble making up his mind, and having to do things over a certain number of times before it's just right. He did not endorse any concerns as "Yes, this stops me from doing a lot of things and wastes a lot of my time."  Mood Disorder Questionnaire (MDQ): The MDQ is a self-report measure of bipolar and related disorder  symptomatology. Mr. Lengle endorsed 8/13 symptoms, noted several have occurred during the same period, and have caused mild-moderate problems. Upon follow-up, he said that his increased interest in sex is "all the time;" he experienced elevated energy, reduced sleep and not missing it, and increased interest in sex regularly occurred together while in college; since college, he only has experienced an increased interest in sex and irritability if he is not receiving the desired amount of sex; and that periods of hypomania- or mania-related symptoms last up to two days. When considering his endorsements and the follow-up information, it is currently unclear if the full criteria for bipolar- and related- disorders is met. He denied awareness of any blood relatives as having been diagnosed with manic-depressive illness or bipolar disorder or having been told he has manic-depressive illness or bipolar disorder by a health professional.   Personality Assessment Inventory (PAI): The PAI is an objective inventory of adult personality. The validity indicators suggested Mr. Babiak profile is interpretable (ICN T = 37, INF T = 44, NIM T = 59, and PIM T = 31). He is endorsing sensory and motor problems (e.g., vision problems, hearing issues, numbness, and/or paralysis; SOM-C T = 66); some stress and worry (ANX T = 68, ANX-C T = 68, DEP-C T = 61, and BOR-A T = 75) and the possibility of specific fears (ARD-P T = 65), unhappiness at least part of the time (DEP T = 67, DEP-A T = 69, and BOR-A T = 75), spending significant time monitoring the environment for evidence others are not trustworthy and may be trying to harm him in some way (PAR-H T = 80 and PAR-P T = 60) and dwelling on past slights by others (PAR-R T = 81 and BOR-N T = 72), difficulty relaxing and fatigue (ANX-A T = 68), overt physical signs of tension (e.g., trembling hands,  complaints of irregular heartbeat, and/or shortness of breath; ANX-P T = 64), vegetative  signs of depression (e.g., sleep problems, appetite issues, and/or lack of drive; DEP-P T = 62), and seeking to relinquish control in relationships which may open him to the possibility of mistreatment or exploitation by others (DOM T = 33), with the aforementioned concerns possibly being at least partially explained by having experienced a past disturbing or traumatic event(s) (ARD-T T = 79). He also endorsed being fairly rigid in his guidelines for personal conduct (ARD-O T = 70), having an activity level that is noticeably high even to casual observers (MAN-A T = 60), optimism and the possibility of inflated self-esteem (MAN-G T = 67), and being prone to impatience and being easily frustrated to an extent that is likely leading to losing his temper (AGG-A T = 73 and AGG-P T = 63), and relational strains with others (MAN-I T = 74, PAR T = 78, and BOR-N T = 72, SCZ-S T = 66, NON T = 69, and WRM T = 40). Mr. Fincannon also indicated entertaining some ideas that others may find unconventional or unusual (SCZ-P T = 63) and experiencing uncertainty about major life issues and difficulties maintaining a sense of purpose (BOR-I T = 77). He appears to acknowledge significant difficulties in functioning and has the perception that help is needed in dealing with them (RXR T = 33). Upon follow-up, Mr. 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     SUMMARY AND CLINICAL IMPRESSIONS: Mr. Daiwik Beardslee is a 43 year old male who was referred by Doree Barthel, LCSW for an evaluation to determine if he currently meets criteria for a diagnosis of Attention-Deficit/Hyperactivity Disorder (ADHD).   Mr. Quebodeaux reported experiencing "major issues with anxiety" but that he has concerns he "might have ADHD," noting he has  experienced ADHD-related symptoms since childhood and that his son and daughter have exhibited symptoms of ADHD and/or autism spectrum disorder. He expressed a belief that his ADHD-related symptoms are consistent and independent of mood, but disorganization, inattention, and restlessness exacerbate and "fuel anxiety."   Mr. Tetteh was administered assessments during the evaluation to measure his current cognitive abilities. His verbal comprehension abilities were in the superior range, and he demonstrated the strongest performance on the Information subtest is primarily a measure of his fund of general knowledge but may also be influenced by cultural experience, quality of education, and ability to retrieve information from long-term memory. The Vocabulary subtest required him to explain the meaning of words presented in isolation. Additionally, performance on this subtest requires abilities to verbalize meaningful concepts, as well as retrieve information from long-term memory. His ability to sustain attention, concentrate, and exert mental control was also in the superior range, which suggests it is comparably developed with his verbal reasoning abilities. Results of the CNS Vital Signs indicated an above neurocognitive processing ability, and he demonstrated strengths in verbal memory, visual memory, psychomotor speed, reaction time, complex attention, cognitive flexibility, processing speed, executive function, sustained attention, and motor speed.   During the clinical interview and on self-report measures, Mr. Georgeson endorsed executive functioning concerns that include attentional dysregulation, hyperactivity- and impulsivity-related symptoms, and meeting the full criteria for ADHD. Moreover, his partner, Marq Milic, also indicated he is experiencing multiple significant  executive functioning issues. When considering self-reported symptoms; endorsed and/or demonstrated significant issues with executive  functioning; a possible familial history of ADHD; and his mental health provider recommending he pursue an ADHD evaluation, a diagnosis of F90.2 Attention-Deficit/Hyperactivity Disorder, Combined Presentation, Moderate appears warranted. The specifier of "Moderate" was given as he endorsed symptoms over what is needed to make the diagnosis and indicated they cause impairment in his academic (e.g., was prone to becoming bored in class and regularly waited until "last minute" to start and complete school assignments), occupational (e.g., being written up for tardiness and having made a mistake on an employment task), social (e.g., frequently engages in excessive talking and interrupting of others), and daily (e.g., regularly experiencing being easily distracted, task initiation and completion issues, disorganization, and forgetfulness) functioning.   Mr. Perlick also endorsed misophonia-related symptoms; periods of low mood, irritability, social withdrawal, and anhedonia; two periods of hypomania- or mania-related symptoms that he attributed to "things were going well" for him; generalized anxiety; obsessive-compulsive disorder-related symptomatology; regular sleep onset issues; and sleep apnea without use of CPAP. As such, the PHQ-9, GAD-7, OCD-A, MDQ, and PAI were administered. His results indicated he experiences minimal depression- and anxiety-related symptomatology; mild OCD-related concerns; and symptoms of hypomanic and manic episode. However, given the limited scope of this evaluation, it was unable to be determined if full criteria for these disorders are met or if his diagnosis of ADHD better explains the symptoms. As such, he would likely benefit from further evaluation of these symptoms to definitively rule in or out depressive disorder, anxiety disorder(s), bipolar- and related-related disorder, OCD, and sleep-wake disorder. Should any of the aforementioned be ruled in, they would likely be in addition to  his diagnosis of ADHD as he described his ADHD-related symptoms as occurring prior to some of the concerns as well as consistent across situations and independent of mood.    DSM-5 Diagnostic Impressions: F90.2 Attention-Deficit/Hyperactivity Disorder, Combined Presentation, Moderate   RECOMMENDATIONS: Mr. Brunick would likely benefit from making use of strategies for ADHD symptoms:  Setting a timer to complete tasks. Break tasks into manageable chunks and spread them out over more extended periods with breaks.  Utilizing lists and day calendars to keep track of tasks.  Answering emails daily.  Improve listening skills by asking the speaker to give information in smaller chunks and asking for explanations and clarification as needed. Leaving more than the anticipated time to complete tasks. It may help to keep tasks brief, well within your attention span, and a mix of both high and low-interest tasks. Tasks may be gradually increased in length. Practice proactive planning by setting aside time every evening to plan for the next day (e.g., prepare needed materials or pack the car the night before).  Learn how to make a practical and reasonable "to-do" list of important tasks and priorities and always keep it easily accessible. Make additional copies in case it is lost or misplaced. Utilize visual reminders by posting appointments, "to-do lists," or schedules in strategic areas at home and work.  Practice using an appointment book, smartphone, or other tech device, or a daily planning calendar, and learn to write down appointments and commitments immediately. Keep notepads or use a portable audio recorder to capture important ideas that would be beneficial to recall later. Learn and practice time management skills. Purchase a programmable alarm watch or set an alarm on a smartphone to avoid losing track of time.  Use a color-coded file system, desk and closet organizers, storage boxes,  or other  organization devices to reduce clutter and improve efficiency and structure.  Implement ways to become more aware of your actions and to inhibit or adjust them as warranted (e.g., reviewing videos of your actions, considering consequences of obeying or not obeying the rules of various upcoming situations, having a trusted other to discuss plans with, and/or provide cues to stop certain behaviors, and make visual cues for rules you would like to follow). The 4Rs: Read just one paragraph, recite out loud in a soft voice or whisper what was important in that material, write that material down in a notebook, then review what you just wrote. Stay flexible and be prepared to change your plans, as symptom breakthroughs and crises will likely occur periodically. Mr. Jacobo may benefit from mindfulness training to address symptoms of inattention.  Mr. Frances would likely benefit from a consultation regarding medication for ADHD symptoms.   Individual therapeutic services may assist in processing a diagnosis of ADHD and discussing coping and compensatory strategies. Mental alertness/energy can be raised by increasing exercise; improving sleep; eating a healthy diet; and managing stress. Consulting with a physician regarding any changes to the physical regimen is recommended. "Failing at Normal: An ADHD Success Story" by Calvert Cantor is a great overview of ADHD. Dr. Janese Banks also has a YouTube channel with helpful videos on ADHD-related topics: http://www.mitchell-reyes.biz/ Applications:  RescueTime. Tracks your activities on your phone and/or computer to determine how productive you have been and what distracted you. Free two-week trial.  [email or web address] . It uses engineered audio that may reduce distractions and assist with focus. Free 15-day trial. Freedom. Allows you to highlight days and times you want to block yourself from certain sites or apps. Free trial. Vesta Mixer.  It allows you  to input your bank accounts and creates a visual layout of information about your financial goals, budget management, alerts, etc. May offer a free trial. Boomerang. It allows you to schedule times an email is sent and to see if others have received or opened your email. Ten messages free per month and a free trial of the premium version. IFTTT. Uses "channels" to create various actions (e.g., if you are mentioned in an email, highlight it in your inbox, and if you miss a call, add it to a to-do list). Free and premium versions. Unroll.me. Cleans up your email by unsubscribing from what you do not want to receive while still getting everything you do. Free. Finish. It allows you to divide two-list tasks into short-term, mid-term, and long-term tasks and determine how much time is left for a task. Focus mode hides non-priority tasks.  Autosilent. Turns your phone ringer on and off based on specified calendars, geo-fences, timers, etc. $3.99. Freakyalarm. It makes you solve math problems to disable an alarm. $1.99. Wake N Shake. It makes you vigorously shake your phone to stop the alarm. $.99. Todoist. It allows you to add sub-tasks to tasks and includes email and web plugins to make it work across the system. Premium has location-based reminders, calendar sync, productive tracking, etc.  Sleep Cycle. Utilize your phone's motion sensors to catch movement while you are asleep. The alarm will wake you as early as 30 minutes before your alarm based on your lightest sleep phase and show you how daily activities affect your sleep quality.  Books: "Taking Charge of Adult ADHD Second Edition" by Dr. Janese Banks "The ADHD Effect on Marriage" by Annamarie Dawley "The Couples Guide to Thriving with ADHD" by Annamarie Dawley Organizations  that are a reliable source of information on ADHD:  Children and Adults with Attention-Deficit/Hyperactivity Disorder (CHADD): chadd.org  Attention Deficit Disorder Association  (ADDA): HotterNames.de ADD Resources: addresources.org ADD WareHouse: addwarehouse.com World Federation of ADHD: adhd-federation.org ADDConsults: FightListings.se. Compilation of ADHD resources: https://www.harrell.com/ Future evaluation, if deemed necessary, and/or to determine the effectiveness of recommended interventions.   Helmut Muster, Psy.D. Licensed Psychologist - [facility] [phone]    References  Laureen Ochs, R. A. (2021). Taking charge of adult ADHD: proven strategies to succeed at work, at   home, and in relationships (pp. 6-10 and 272-276). [employer].             Margarite Gouge, PsyD

## 2023-09-04 ENCOUNTER — Other Ambulatory Visit (HOSPITAL_COMMUNITY): Payer: Self-pay

## 2023-09-04 MED ORDER — CABERGOLINE 0.5 MG PO TABS
0.5000 mg | ORAL_TABLET | ORAL | 1 refills | Status: AC
  Filled 2023-09-04: qty 16, 37d supply, fill #0
  Filled 2023-09-06: qty 20, 47d supply, fill #0
  Filled 2023-09-21: qty 32, 75d supply, fill #1
  Filled 2023-10-04: qty 16, 37d supply, fill #1
  Filled 2024-07-09: qty 16, 37d supply, fill #2
  Filled 2024-08-27: qty 16, 37d supply, fill #3

## 2023-09-06 ENCOUNTER — Other Ambulatory Visit: Payer: Self-pay

## 2023-09-06 ENCOUNTER — Other Ambulatory Visit (HOSPITAL_COMMUNITY): Payer: Self-pay

## 2023-09-09 ENCOUNTER — Ambulatory Visit: Payer: 59 | Admitting: Clinical

## 2023-09-09 DIAGNOSIS — F411 Generalized anxiety disorder: Secondary | ICD-10-CM

## 2023-09-09 NOTE — Progress Notes (Signed)
                Dezi Schaner, LCSW 

## 2023-09-09 NOTE — Progress Notes (Signed)
[facility] Counselor/Therapist Progress Note  Patient ID: Joel Marshall, MRN: [id number],    Date: 09/09/2023  Time Spent: 2:34pm - 3:31pm : 57 minutes  Treatment Type: Individual Therapy  Reported Symptoms: Patient reported feelings of anger in response to recent triggers  Mental Status Exam: Appearance:  Neat and Well Groomed     Behavior: Appropriate  Motor: Normal  Speech/Language:  Clear and Coherent  Affect: Appropriate  Mood: normal  Thought process: normal  Thought content:   WNL  Sensory/Perceptual disturbances:   WNL  Orientation: oriented to person, place, and situation  Attention: Good  Concentration: Good  Memory: WNL  Fund of knowledge:  Good  Insight:   Good  Judgment:  Good  Impulse Control: Good   Risk Assessment: Danger to Self:  No Patient denied current suicidal ideation  Self-injurious Behavior: No Danger to Others: No Patient denied current homicidal ideation Duty to Warn:no Physical Aggression / Violence:No  Access to Firearms a concern: No  Gang Involvement:No   Subjective: Patient stated, "kids are out of school for a couple of days due to this weather and has impacted me working".  Patient reported his children returned to school today. Patient stated, "I'm doing great". Patient stated, "my son still doesn't want to take a bath and constantly saying no he doesn't want to do something". Patient stated, "I'm at the point where I'm just going to walk away" in reference to son's behaviors. Patient reported he recently attended an appointment with Dr. Dewaine Conger to review the results of his ADHD evaluation. Patient reported he received a diagnosis of ADHD, combined type as a result of the testing. Patient stated, "catharsis", "I always knew something was wrong but I didn't know what" in response to evaluation results. Patient stated, "I notice that in my son and my daughter" in reference to symptoms of ADHD. Patient stated, "surprisingly no" in  response to entries in thought record. Patient stated,  "I'm still on this side of the earth and not in it, my wife is still living, I still have my kids, I still have my job, for the most part I still have my health" in response to homework to identify positives. Patient also identified his ankle not hurting and gas prices are lower as additional positives. Patient reported his wife sent patient a text that the president is trying to repeal the 14th amendment and patient stated, "it infuriates me". Patient stated, "in that moment I try to do anything else" in response to anger.     Interventions: Cognitive Behavioral Therapy. Clinician conducted session in person at clinician's office at [facility]. Reviewed events since last session. Assessed patient's mood since last session and current mood. Discussed triggers for feelings of anger and strategies to utilize in response to anger, such as, taking a time out before responding, playing a game on patient's phone, going into another room/space, deep breathing. Discussed patient's recent evaluation for ADHD and patient's thoughts/feelings in response. Reviewed patient's thought record and patient's homework to identify 3 positives each day. Provided psycho education related to symptoms of ADHD, symptoms of anxiety, and deep breathing exercises. Clinician requested for homework patient continue thought record, practice identifying evidence for/against thoughts, and identify 3 positives each day, practice deep breathing exercise.    Collaboration of Care: Discussed consent for Dr. Helmut Muster at [facility]   Diagnosis:  Generalized anxiety disorder with panic attacks R/O ADHD     Plan: Patient is to utilize Cognitive Behavioral  Therapy, thought re-framing, relaxation techniques, healthy communication strategies, and coping strategies to decrease symptoms associated with their diagnosis. Frequency: bi-weekly  Modality: individual       Long-term goal:   Reduce overall level, frequency, and intensity of the feelings of anxiety and anger as evidenced by decrease in panic attacks, irritability, easily agitated, fluctuation in appetite, difficulty falling asleep and staying asleep, difficulty concentrating, feeling on edge, restlessness,  worry, difficulty controlling the worry, easily distracted, "always fidgety", difficulty completing tasks, difficulty with organization, and difficulty staying on tasks from 7 days/week to 0 to 1 days/week per patient report for at least 3 consecutive months. Target Date: 05/05/24  Progress: progressing    Short-term goal:  Identify triggers for anger/irritability Target Date: 11/03/23  Progress: progressing    develop and implement coping strategies to utilize in response to feelings of anger/irritability to reduce anger outbursts per patient's report  Target Date: 11/03/23  Progress: progressing    Develop and implement healthy communication strategies for patient to utilize when expressing his thoughts and feelings to others in a controlled and assertive way  Target Date: 11/03/23  Progress: progressing    Identify, challenge, and replace negative core beliefs, thought patterns, and negative self talk that contribute to feelings of anger, anxiety, and low self confidence with positive thoughts, beliefs, and positive self talk per patient's report Target Date: 11/03/23  Progress: progressing    Develop, establish, and maintain healthy boundaries with others  Target Date: 11/03/23  Progress: progressing     Doree Barthel, LCSW

## 2023-09-10 ENCOUNTER — Other Ambulatory Visit: Payer: Self-pay | Admitting: Physician Assistant

## 2023-09-10 ENCOUNTER — Other Ambulatory Visit (HOSPITAL_COMMUNITY): Payer: Self-pay

## 2023-09-10 MED ORDER — NEBIVOLOL HCL 10 MG PO TABS
10.0000 mg | ORAL_TABLET | Freq: Every day | ORAL | 1 refills | Status: DC
  Filled 2023-09-10: qty 90, 90d supply, fill #0
  Filled 2023-12-11: qty 90, 90d supply, fill #1

## 2023-09-11 ENCOUNTER — Other Ambulatory Visit (HOSPITAL_COMMUNITY): Payer: Self-pay

## 2023-09-16 ENCOUNTER — Ambulatory Visit (INDEPENDENT_AMBULATORY_CARE_PROVIDER_SITE_OTHER): Payer: 59 | Admitting: Clinical

## 2023-09-16 DIAGNOSIS — F411 Generalized anxiety disorder: Secondary | ICD-10-CM

## 2023-09-16 NOTE — Progress Notes (Signed)
[facility] Counselor/Therapist Progress Note  Patient ID: Joel Marshall, MRN: [id number],    Date: 09/16/2023  Time Spent: 2:39pm - 3:30pm : 51 minutes   Treatment Type: Individual Therapy  Reported Symptoms: Patient reported recent feelings of anger  Mental Status Exam: Appearance:  Neat and Well Groomed     Behavior: Appropriate  Motor: Normal  Speech/Language:  Clear and Coherent and Normal Rate  Affect: Appropriate  Mood: normal  Thought process: normal  Thought content:   WNL  Sensory/Perceptual disturbances:   WNL  Orientation: oriented to person, place, and situation  Attention: Good  Concentration: Good  Memory: WNL  Fund of knowledge:  Good  Insight:   Good  Judgment:  Good  Impulse Control: Fair   Risk Assessment: Danger to Self:  No Patient denied current suicidal ideation  Self-injurious Behavior: No Danger to Others: No Patient denied current homicidal ideation Duty to Warn:no Physical Aggression / Violence:No  Access to Firearms a concern: No  Gang Involvement:No   Subjective: Patient stated, "everything was going great until yesterday, best and worst day ever". Patient reported yesterday was his birthday and he was sick on his birthday. Patient stated, "the trip to the license plate agency was what made it the worst day ever". Patient reported the license plate agency indicated patient has had a lapse in insurance coverage. Patient reported his insurance company sent the license plate agency the form required to show proof of coverage. Patient reported he was not able to obtain his tags yesterday due to the insurance matter. Patient reported he slammed the door when he walked out of the license plate agency. Patient stated, "probably not go there to begin with" in response to an alternative response. Patient stated, "I didn't have a mindset to breathe at that point, I was seeing red and livid". Patient stated, "probably with me my warning signs is at  already too late of a point". Patient identified the following warning signs of anger: "breathing quickens", eyes narrow, heart starts racing, sweating.  Patient reported he plans to apologize to the license plate agency staff member when he returns to obtain his tag. Patient stated, "it sounded like she didn't want to help me and that's what infuriated me".  Patient stated, "people actually remembered it" in reference to patient's birthday and reported friends/family remembering contributed to patient having a good birthday. Patient stated, "I go out of the way to wish family and friends a happy birthday", "this was probably one of the best birthdays because I was remembered". Patient reported he feels his friendships are "one sided" and stated, "I'm always the one that has to reach out". Patient stated,  "I've already come to the conclusion that they're (friends) not going to change". Patient stated, "all I ever wanted was to fit in when I was growing up". Patient reported he feels others do not want patient to be happy and stated, "I was always the butt of their jokes", "none of them ever listened to me". Patient reported he has a friend from high school, friends from college, and another friend that he feels shows reciprocity in the relationship. Patient stated, "still not feeling that great".    Interventions: Cognitive Behavioral Therapy. Clinician conducted session in person at clinician's office at [facility]. Reviewed events since last session. Discussed patient's feelings regarding patient's birthday. Discussed incident at the license plate agency, assisted patient in exploring and identifying triggers for feelings of anger, examined patient's behavioral response to feelings  of anger and explored barriers to utilizing coping skills in response to the situation. Challenged statements/thoughts to help patient reframe situation and patient's response at license plate agency. Explored and  identified strategies for patient to utilize in response to anger when returning to the license plate agency, such as, deep breathing exercises, counting, walking away from conflict. Assisted patient in exploring and identifying warning signs of anger. Discussed patient's concerns related to current friendships and dynamics of friendships. Explored and identified thoughts/feelings triggered by friends not contacting patient on patient's birthday. Clinician requested for homework patient continue thought record, practice identifying evidence for/against thoughts, and identify 3 positives each day, practice deep breathing exercise.   Collaboration of Care: not required at this time   Diagnosis:  Generalized anxiety disorder with panic attacks R/O ADHD     Plan: Patient is to utilize [employer], thought re-framing, relaxation techniques, healthy communication strategies, and coping strategies to decrease symptoms associated with their diagnosis. Frequency: bi-weekly  Modality: individual      Long-term goal:   Reduce overall level, frequency, and intensity of the feelings of anxiety and anger as evidenced by decrease in panic attacks, irritability, easily agitated, fluctuation in appetite, difficulty falling asleep and staying asleep, difficulty concentrating, feeling on edge, restlessness,  worry, difficulty controlling the worry, easily distracted, "always fidgety", difficulty completing tasks, difficulty with organization, and difficulty staying on tasks from 7 days/week to 0 to 1 days/week per patient report for at least 3 consecutive months. Target Date: 05/05/24  Progress: progressing    Short-term goal:  Identify triggers for anger/irritability Target Date: 11/03/23  Progress: progressing    develop and implement coping strategies to utilize in response to feelings of anger/irritability to reduce anger outbursts per patient's report  Target Date: 11/03/23  Progress:  progressing    Develop and implement healthy communication strategies for patient to utilize when expressing his thoughts and feelings to others in a controlled and assertive way  Target Date: 11/03/23  Progress: progressing    Identify, challenge, and replace negative core beliefs, thought patterns, and negative self talk that contribute to feelings of anger, anxiety, and low self confidence with positive thoughts, beliefs, and positive self talk per patient's report Target Date: 11/03/23  Progress: progressing    Develop, establish, and maintain healthy boundaries with others  Target Date: 11/03/23  Progress: progressing    Doree Barthel, LCSW

## 2023-09-16 NOTE — Progress Notes (Signed)
Doree Barthel, LCSW

## 2023-09-21 ENCOUNTER — Other Ambulatory Visit (HOSPITAL_COMMUNITY): Payer: Self-pay

## 2023-09-30 ENCOUNTER — Ambulatory Visit: Payer: 59 | Admitting: Clinical

## 2023-09-30 DIAGNOSIS — F410 Panic disorder [episodic paroxysmal anxiety] without agoraphobia: Secondary | ICD-10-CM | POA: Diagnosis not present

## 2023-09-30 DIAGNOSIS — F411 Generalized anxiety disorder: Secondary | ICD-10-CM | POA: Diagnosis not present

## 2023-09-30 NOTE — Progress Notes (Signed)
Doree Barthel, LCSW

## 2023-09-30 NOTE — Progress Notes (Signed)
[facility] Counselor/Therapist Progress Note  Patient ID: Joel Marshall, MRN: [id number],    Date: 09/30/2023  Time Spent: 2:35pm - 3:29pm : 54 minutes   Treatment Type: Individual Therapy  Reported Symptoms: Patient reported recent feelings of anger  Mental Status Exam: Appearance:  Neat and Well Groomed     Behavior: Appropriate  Motor: Normal  Speech/Language:  Clear and Coherent and Normal Rate  Affect: Appropriate  Mood: normal  Thought process: tangential  Thought content:   Tangential  Sensory/Perceptual disturbances:   WNL  Orientation: oriented to person, place, and situation  Attention: Good  Concentration: Good  Memory: WNL  Fund of knowledge:  Good  Insight:   Good  Judgment:  Good  Impulse Control: Good   Risk Assessment: Danger to Self:  No Patient denied current suicidal ideation  Self-injurious Behavior: No Danger to Others: No Patient denied current homicidal ideation Duty to Warn:no Physical Aggression / Violence:No  Access to Firearms a concern: No  Gang Involvement:No   Subjective: Patient stated, "work is still good, home life is decent". Patient stated, "its a work in progress in regard to my son, he's gotten a little bit better, I've gotten a little bit better myself". Patient reported feeling angry during recent conversation with daughter and reported thinking, "I'm going to get up before I say something I end up regretting". Patient reported he went upstairs, took some deep breaths, and returned to the conversation with his daughter. Patient stated, "I felt good about the fact that I didn't go off on her" and "was able to convey it without mostly cussing". Patient reported feeling patient/daughter's conversation was more productive after patient was able to utilize therapeutic tools. Patient reported his daughter started "whining and crying" and stated "that is a trigger for me, that is one of the biggest triggers for me". Patient stated,  "dependent upon the weather and how people drive it varies" in response to intensity and frequency of anger. Patient reported listening to smooth jazz and classical music is calming. Patient reported anger has "decreased a lot". During session, patient inquired about the medication, xanax, and medications used to treat ADHD. Patient reported "in the moment taking a step back and breathing" is a distraction. Patient identified music, writing, cooking, baking, rolling dough, using stress balls, throwing a football, going for a drive, and playing video game as potential distractions. Patient stated, "the game is helpful". Patient stated, "I'm actually feeling pretty good today".   Interventions: Cognitive Behavioral Therapy. Clinician conducted session in person at clinician's office at [facility]. Reviewed events since last session. Discussed recent feelings of anger and patient's response. Praised patient's use of therapeutic tools in response to anger. Explored patient's feelings in response to implementation of therapeutic tools and the outcome. Assessed intensity and frequency of anger since last session. Assisted patient in exploring and identifying additional triggers for anger. Provided psycho education related to psychotropic medications. Provided psycho education related to use of distractions in response to anger. Assisted patient in exploring and identifying distractions for patient to utilize in response to anger. Assessed patient's current mood. Clinician requested for homework patient continue thought record, practice identifying evidence for/against thoughts, and identify 3 positives each day, practice deep breathing exercise and distractions.    Collaboration of Care: not required at this time   Diagnosis:  Generalized anxiety disorder with panic attacks R/O ADHD     Plan: Patient is to utilize [employer] Therapy, thought re-framing, relaxation techniques, healthy  communication strategies, and coping strategies to decrease symptoms associated with their diagnosis. Frequency: bi-weekly  Modality: individual      Long-term goal:   Reduce overall level, frequency, and intensity of the feelings of anxiety and anger as evidenced by decrease in panic attacks, irritability, easily agitated, fluctuation in appetite, difficulty falling asleep and staying asleep, difficulty concentrating, feeling on edge, restlessness,  worry, difficulty controlling the worry, easily distracted, "always fidgety", difficulty completing tasks, difficulty with organization, and difficulty staying on tasks from 7 days/week to 0 to 1 days/week per patient report for at least 3 consecutive months. Target Date: 05/05/24  Progress: progressing    Short-term goal:  Identify triggers for anger/irritability Target Date: 11/03/23  Progress: progressing    develop and implement coping strategies to utilize in response to feelings of anger/irritability to reduce anger outbursts per patient's report  Target Date: 11/03/23  Progress: progressing    Develop and implement healthy communication strategies for patient to utilize when expressing his thoughts and feelings to others in a controlled and assertive way  Target Date: 11/03/23  Progress: progressing    Identify, challenge, and replace negative core beliefs, thought patterns, and negative self talk that contribute to feelings of anger, anxiety, and low self confidence with positive thoughts, beliefs, and positive self talk per patient's report Target Date: 11/03/23  Progress: progressing    Develop, establish, and maintain healthy boundaries with others  Target Date: 11/03/23  Progress: progressing     Doree Barthel, LCSW

## 2023-10-04 ENCOUNTER — Other Ambulatory Visit: Payer: Self-pay

## 2023-10-05 ENCOUNTER — Other Ambulatory Visit (HOSPITAL_COMMUNITY): Payer: Self-pay

## 2023-10-05 DIAGNOSIS — R739 Hyperglycemia, unspecified: Secondary | ICD-10-CM | POA: Diagnosis not present

## 2023-10-05 DIAGNOSIS — I10 Essential (primary) hypertension: Secondary | ICD-10-CM | POA: Diagnosis not present

## 2023-10-05 DIAGNOSIS — E291 Testicular hypofunction: Secondary | ICD-10-CM | POA: Diagnosis not present

## 2023-10-05 DIAGNOSIS — D352 Benign neoplasm of pituitary gland: Secondary | ICD-10-CM | POA: Diagnosis not present

## 2023-10-05 MED ORDER — DEXAMETHASONE 1 MG PO TABS
1.0000 mg | ORAL_TABLET | Freq: Once | ORAL | 0 refills | Status: DC
  Filled 2023-10-05: qty 1, 1d supply, fill #0

## 2023-10-05 MED ORDER — CABERGOLINE 0.5 MG PO TABS
0.5000 mg | ORAL_TABLET | ORAL | 1 refills | Status: AC
  Filled 2023-10-05 – 2023-11-12 (×3): qty 36, 84d supply, fill #0
  Filled 2023-11-18: qty 16, 38d supply, fill #0
  Filled 2023-12-02 – 2023-12-27 (×2): qty 16, 38d supply, fill #1
  Filled 2024-09-20: qty 16, 38d supply, fill #2
  Filled ????-??-??: fill #2

## 2023-10-06 ENCOUNTER — Other Ambulatory Visit (HOSPITAL_COMMUNITY): Payer: Self-pay

## 2023-10-06 MED ORDER — TESTOSTERONE 1.62 % TD GEL
TRANSDERMAL | 2 refills | Status: AC
  Filled 2023-10-06: qty 75, 30d supply, fill #0
  Filled 2024-03-14: qty 75, 30d supply, fill #1

## 2023-10-09 ENCOUNTER — Other Ambulatory Visit (HOSPITAL_COMMUNITY): Payer: Self-pay

## 2023-10-12 DIAGNOSIS — Z87448 Personal history of other diseases of urinary system: Secondary | ICD-10-CM | POA: Diagnosis not present

## 2023-10-12 DIAGNOSIS — Z09 Encounter for follow-up examination after completed treatment for conditions other than malignant neoplasm: Secondary | ICD-10-CM | POA: Diagnosis not present

## 2023-10-14 ENCOUNTER — Ambulatory Visit: Payer: 59 | Admitting: Clinical

## 2023-10-14 DIAGNOSIS — F411 Generalized anxiety disorder: Secondary | ICD-10-CM | POA: Diagnosis not present

## 2023-10-14 DIAGNOSIS — D352 Benign neoplasm of pituitary gland: Secondary | ICD-10-CM | POA: Diagnosis not present

## 2023-10-14 NOTE — Progress Notes (Signed)
 [facility] Counselor/Therapist Progress Note  Patient ID: Joel Marshall, MRN: [id number],    Date: 10/14/2023  Time Spent: 2:31pm - 3:22pm : 51 minutes   Treatment Type: Individual Therapy  Reported Symptoms: Patient reported recent feelings of anger, irritability   Mental Status Exam: Appearance:  Neat and Well Groomed     Behavior: Appropriate  Motor: Normal  Speech/Language:  Clear and Coherent and Normal Rate  Affect: Appropriate  Mood: normal  Thought process: normal  Thought content:   WNL  Sensory/Perceptual disturbances:   WNL  Orientation: oriented to person, place, and situation  Attention: Good  Concentration: Good  Memory: WNL  Fund of knowledge:  Good  Insight:   Good  Judgment:  Good  Impulse Control: Good   Risk Assessment: Danger to Self:  No Patient denied current suicidal ideation  Self-injurious Behavior: No Danger to Others: No Patient denied current homicidal ideation Duty to Warn:no Physical Aggression / Violence:No  Access to Firearms a concern: No  Gang Involvement:No   Subjective: Patient stated, "its been going pretty good" in response to events since last session. Patient reported several upcoming medical appointments and an upcoming surgery in May. Patient reported patient's wife and patient discussed seeking another pediatrician for their son and reported son's current pediatrician does not support an evaluation for ADHD for patient's son. Patient stated, "something's got to give as far as that behavior". Patient reported concern regarding his experiencing urinary accidents. Patient reported he feels his son is experiencing accidents due to his son being distracted by electronic devices/toys and son delaying going to the bathroom. Patient stated, "not the best in the world" in response to patient's reaction to son's behaviors and stated, "that is one thing that really irritates me" in reference to his son having accidents. Patient  stated, "I think it has a lot to do with him being defiant". Patient reported his son replies, "stop telling me what to do". Patient stated, "I yelled" in response to son's accidents. Patient reported he was not able to practice coping strategies in response to son having an accident due to the immediate need to clean the floor and getting his son ready for school. Patient stated,  "Its been pretty good" in response to patient's mood. Patient stated, "its been decent" in response to current mood. Patient stated, "I can attempt it", "I will give it a try" in response to mindfulness exercise of visualizing the calm.   Interventions: Cognitive Behavioral Therapy. Clinician conducted session in person at clinician's office at [facility]. Reviewed events since last session. Discussed patient's upcoming medical appointments and patient's concerns related to his son. Discussed contacting son's pediatrician to discuss patient's concerns related to his son experiencing accidents. Discussed recent triggers for feelings of anger/irritability and examined patient's response. Explored barriers to patient implementing coping strategies in response to situation with patient's son. Explored and identified thoughts triggered by son's accidents. Assessed frequency and intensity of feelings of anger and anxiety since last session. Assessed patient's mood since last session and current mood. Provided psycho education related to mindfulness exercise of visualizing the calm. Clinician requested for homework patient continue thought record, practice identifying evidence for/against thoughts, and identify 3 positives each day, practice deep breathing exercise and distractions.    Collaboration of Care: not required at this time   Diagnosis:  Generalized anxiety disorder with panic attacks R/O ADHD     Plan: Patient is to utilize [employer] Therapy, thought re-framing, relaxation techniques, healthy  communication strategies, and coping strategies to decrease symptoms associated with their diagnosis. Frequency: bi-weekly  Modality: individual      Long-term goal:   Reduce overall level, frequency, and intensity of the feelings of anxiety and anger as evidenced by decrease in panic attacks, irritability, easily agitated, fluctuation in appetite, difficulty falling asleep and staying asleep, difficulty concentrating, feeling on edge, restlessness,  worry, difficulty controlling the worry, easily distracted, "always fidgety", difficulty completing tasks, difficulty with organization, and difficulty staying on tasks from 7 days/week to 0 to 1 days/week per patient report for at least 3 consecutive months. Target Date: 05/05/24  Progress: progressing    Short-term goal:  Identify triggers for anger/irritability Target Date: 11/03/23  Progress: progressing    develop and implement coping strategies to utilize in response to feelings of anger/irritability to reduce anger outbursts per patient's report  Target Date: 11/03/23  Progress: progressing    Develop and implement healthy communication strategies for patient to utilize when expressing his thoughts and feelings to others in a controlled and assertive way  Target Date: 11/03/23  Progress: progressing    Identify, challenge, and replace negative core beliefs, thought patterns, and negative self talk that contribute to feelings of anger, anxiety, and low self confidence with positive thoughts, beliefs, and positive self talk per patient's report Target Date: 11/03/23  Progress: progressing    Develop, establish, and maintain healthy boundaries with others  Target Date: 11/03/23  Progress: progressing    Doree Barthel, LCSW

## 2023-10-14 NOTE — Progress Notes (Signed)
   Joel Barthel, LCSW

## 2023-10-15 ENCOUNTER — Other Ambulatory Visit (HOSPITAL_COMMUNITY): Payer: Self-pay

## 2023-10-15 MED ORDER — TESTOSTERONE 1.62 % TD GEL
TRANSDERMAL | 2 refills | Status: AC
  Filled 2023-10-15 – 2023-11-30 (×2): qty 75, 20d supply, fill #0
  Filled 2024-02-17: qty 75, 20d supply, fill #1
  Filled 2024-03-31: qty 75, 20d supply, fill #2

## 2023-10-28 ENCOUNTER — Ambulatory Visit: Payer: 59 | Admitting: Clinical

## 2023-10-28 DIAGNOSIS — F411 Generalized anxiety disorder: Secondary | ICD-10-CM

## 2023-10-28 NOTE — Progress Notes (Signed)
   Joel Barthel, LCSW

## 2023-10-28 NOTE — Progress Notes (Signed)
 [facility] Counselor/Therapist Progress Note  Patient ID: Joel Marshall, MRN: [id number],    Date: 10/28/2023  Time Spent: 2:34pm - 3:24pm : 50 minutes   Treatment Type: Individual Therapy  Reported Symptoms: difficulty falling asleep, fatigue  Mental Status Exam: Appearance:  Neat and Well Groomed     Behavior: Appropriate  Motor: Normal  Speech/Language:  Clear and Coherent and Normal Rate  Affect: Appropriate  Mood: normal  Thought process: normal  Thought content:   WNL  Sensory/Perceptual disturbances:   WNL  Orientation: oriented to person, place, and situation  Attention: Good  Concentration: Good  Memory: WNL  Fund of knowledge:  Good  Insight:   Good  Judgment:  Good  Impulse Control: Good   Risk Assessment: Danger to Self:  No Patient denied current suicidal ideation  Self-injurious Behavior: No Danger to Others: No Patient denied current homicidal ideation Duty to Warn:no Physical Aggression / Violence:No  Access to Firearms a concern: No  Gang Involvement:No   Subjective: Patient stated, "about the same as far as my son but that's to be expected" in response to events since last session. Patient reported he woke his son up this morning and stated, "I didn't get angry, didn't yell, didn't curse".  Patient stated, "whenever I'm in a rush is when I'm more irritable", "I'm not a morning person either", "having to deal with traffic" in reference to triggers for anger/irritability. Patient reported he typically does not go to bed before midnight and reported difficulty falling asleep due to not feeling sleepy. Patient reported at times he does not go to bed until 1 am - 4 am and wakes up at 6 am each morning. Patient stated, "I worry a lot" and reported playing video games at night to distract himself from the worry. Patient reported he is worried about his upcoming surgery in May, the recovery from the surgery, and time out of work. Patient reported worry  about the tumor and if the tumor has decreased in size.  Patient reported he plays video games prior to going to sleep.  Patient reported his bedroom is warmer than other rooms in the house. Patient reported drinking 1-2 cups of coffee in the mornings and reported drinking a caseinate beverage at lunch. Patient stated, "overall it's been pretty good today" in response to patient's mood.   Interventions: Cognitive Behavioral Therapy. Clinician conducted session in person at clinician's office at [facility]. Reviewed events since last session and assessed for changes. Discussed difference in patient's response to son's behaviors. Explored and identified additional triggers for anger/irritability. Discussed patient's report of difficulty sleeping.  Assessed patient's sleep hygiene. Discussed patient initiating a conversation with patient's PCP to discuss fatigue. Provided psycho education related to sleep and healthy sleep hygiene. Discussed use of a worry journal. Clinician requested for homework patient continue thought record, practice identifying evidence for/against thoughts, and identify 3 positives each day, practice mindfulness exercises.    Collaboration of Care: not required at this time   Diagnosis:  Generalized anxiety disorder with panic attacks R/O ADHD     Plan: Patient is to utilize [employer] Therapy, thought re-framing, relaxation techniques, healthy communication strategies, and coping strategies to decrease symptoms associated with their diagnosis. Frequency: bi-weekly  Modality: individual      Long-term goal:   Reduce overall level, frequency, and intensity of the feelings of anxiety and anger as evidenced by decrease in panic attacks, irritability, easily agitated, fluctuation in appetite, difficulty falling asleep and staying asleep,  difficulty concentrating, feeling on edge, restlessness,  worry, difficulty controlling the worry, easily distracted, "always  fidgety", difficulty completing tasks, difficulty with organization, and difficulty staying on tasks from 7 days/week to 0 to 1 days/week per patient report for at least 3 consecutive months. Target Date: 05/05/24  Progress: progressing    Short-term goal:  Identify triggers for anger/irritability Target Date: 11/03/23  Progress: progressing    develop and implement coping strategies to utilize in response to feelings of anger/irritability to reduce anger outbursts per patient's report  Target Date: 11/03/23  Progress: progressing    Develop and implement healthy communication strategies for patient to utilize when expressing his thoughts and feelings to others in a controlled and assertive way  Target Date: 11/03/23  Progress: progressing    Identify, challenge, and replace negative core beliefs, thought patterns, and negative self talk that contribute to feelings of anger, anxiety, and low self confidence with positive thoughts, beliefs, and positive self talk per patient's report Target Date: 11/03/23  Progress: progressing    Develop, establish, and maintain healthy boundaries with others  Target Date: 11/03/23  Progress: progressing       Doree Barthel, LCSW

## 2023-11-11 ENCOUNTER — Ambulatory Visit: Payer: 59 | Admitting: Clinical

## 2023-11-11 DIAGNOSIS — F411 Generalized anxiety disorder: Secondary | ICD-10-CM | POA: Diagnosis not present

## 2023-11-11 NOTE — Progress Notes (Signed)
   Joel Barthel, LCSW

## 2023-11-11 NOTE — Progress Notes (Signed)
 [facility] Counselor/Therapist Progress Note  Patient ID: Joel Marshall, MRN: [id number],    Date: 11/11/2023  Time Spent: 2:36pm - 3:29pm : 53 minutes   Treatment Type: Individual Therapy  Reported Symptoms: fluctuation in mood, worry  Mental Status Exam: Appearance:  Neat and Well Groomed     Behavior: Appropriate  Motor: Normal  Speech/Language:  Clear and Coherent and Normal Rate  Affect: Appropriate  Mood: anxious  Thought process: normal  Thought content:   WNL  Sensory/Perceptual disturbances:   WNL  Orientation: oriented to person, place, time/date, and situation  Attention: Good  Concentration: Good  Memory: WNL  Fund of knowledge:  Good  Insight:   Good  Judgment:  Good  Impulse Control: Good   Risk Assessment: Danger to Self:  No Patient denied current suicidal ideation  Self-injurious Behavior: No Danger to Others: No Patient denied current homicidal ideation Duty to Warn:no Physical Aggression / Violence:No  Access to Firearms a concern: No  Gang Involvement:No   Subjective: Patient reported several positive situations have occurred since last session. Patient stated, "up and down, mostly up" in response to mood since last session. Patient reported he has been thinking about being out of work for 3 weeks after patient's upcoming surgery and reported he feels this is triggering a decline in mood. Patient reported worry related to the results of patient's upcoming mri. Patient reported if the MRI indicates the tumor has grown or not decrease in size patient will start radiation.  Patient reported he is concerned about the upcoming surgery and concerned about having a catheter for 3 weeks. Patient reported he wants to get the procedures over with and reported he worries if they put patient to sleep "am I going to come back". Patient reported a history of two surgeries and "several near death experiences".  Patient stated, "I think I have most of them  identified, I think I've figured out the majority of them" in response to patient's first short term goal. Patient stated, "I definitely think that when it comes to on the road I have quite a ways to go" and "I need to work on it a little bit more with my son but I've gotten a lot better" in response to patient's second short term goal. Patient stated, "we'll revisit that one another day, I still have a way sot go on that one" in response to patient's third short term goal. Patient stated, "I still gotta work on that one too" in response to patient's fourth short term goal. Patient stated, "were going to have to revisit that one", "that's one I'm going to really have to work on" in response to patient's fifth short term goal and patient reported he feels this goal is the most challenging goal.   Interventions: Cognitive Behavioral Therapy. Clinician conducted session in person at clinician's office at [facility]. Reviewed events since last session and assessed for changes. Explored and identified triggers for recent decline in mood. Assisted patient in exploring and identifying thoughts/feelings triggered by upcoming medical procedures. Challenged statements/cognitive distortions and assisted patient in identifying evidence for/against thoughts. Discussed patient calling medical provider to discuss his concerns related to sleep apnea and surgery. Reviewed patient's thought record. Provided psycho education related to cognitive distortions and anxiety. Reviewed patient's goals for therapy and patient's progress. Clinician requested for homework patient continue thought record, practice identifying evidence for/against thoughts related to upcoming medical procedures.    Collaboration of Care: not required at this time  Diagnosis:  Generalized anxiety disorder with panic attacks R/O ADHD     Plan: Patient is to utilize [employer] Therapy, thought re-framing, relaxation techniques, healthy  communication strategies, and coping strategies to decrease symptoms associated with their diagnosis. Frequency: bi-weekly  Modality: individual      Long-term goal:   Reduce overall level, frequency, and intensity of the feelings of anxiety and anger as evidenced by decrease in panic attacks, irritability, easily agitated, fluctuation in appetite, difficulty falling asleep and staying asleep, difficulty concentrating, feeling on edge, restlessness,  worry, difficulty controlling the worry, easily distracted, "always fidgety", difficulty completing tasks, difficulty with organization, and difficulty staying on tasks from 7 days/week to 0 to 1 days/week per patient report for at least 3 consecutive months. Target Date: 05/05/24  Progress: progressing    Short-term goal:  Identify triggers for anger/irritability   Target Date: 11/03/23  Progress: patient reported he feels this goal has been met    develop and implement coping strategies to utilize in response to feelings of anger/irritability to reduce anger outbursts per patient's report   Target Date: 05/05/24  Progress: progressing    Develop and implement healthy communication strategies for patient to utilize when expressing his thoughts and feelings to others in a controlled and assertive way   Target Date: 05/05/24  Progress: progressing    Identify, challenge, and replace negative core beliefs, thought patterns, and negative self talk that contribute to feelings of anger, anxiety, and low self confidence with positive thoughts, beliefs, and positive self talk per patient's report  Target Date: 05/05/24  Progress: progressing    Develop, establish, and maintain healthy boundaries with others   Target Date: 05/05/24  Progress: progressing     Doree Barthel, LCSW

## 2023-11-12 ENCOUNTER — Other Ambulatory Visit (HOSPITAL_COMMUNITY): Payer: Self-pay

## 2023-11-12 MED ORDER — TESTOSTERONE 1.62 % TD GEL
TRANSDERMAL | 2 refills | Status: DC
Start: 2023-11-12 — End: 2024-05-30
  Filled 2023-11-12: qty 75, 20d supply, fill #0
  Filled 2024-04-18 – 2024-04-27 (×2): qty 75, 20d supply, fill #1

## 2023-11-18 ENCOUNTER — Other Ambulatory Visit (HOSPITAL_COMMUNITY): Payer: Self-pay

## 2023-11-19 ENCOUNTER — Other Ambulatory Visit (HOSPITAL_COMMUNITY): Payer: Self-pay

## 2023-11-25 ENCOUNTER — Ambulatory Visit: Admitting: Clinical

## 2023-11-25 DIAGNOSIS — F411 Generalized anxiety disorder: Secondary | ICD-10-CM

## 2023-11-25 NOTE — Progress Notes (Signed)
   Doree Barthel, LCSW

## 2023-11-25 NOTE — Progress Notes (Signed)
 [facility] Counselor/Therapist Progress Note  Patient ID: Sara Selvidge, MRN: [id number],    Date: 11/25/2023  Time Spent: 2:36pm - 3:29pm : 53 minutes   Treatment Type: Individual Therapy  Reported Symptoms: worry  Mental Status Exam: Appearance:  Neat and Well Groomed     Behavior: Appropriate  Motor: Normal  Speech/Language:  Clear and Coherent and Normal Rate  Affect: Appropriate  Mood: Patient reported "ambivalent" in response to mood  Thought process: normal  Thought content:   WNL  Sensory/Perceptual disturbances:   WNL  Orientation: oriented to person, place, and situation  Attention: Good  Concentration: Good  Memory: WNL  Fund of knowledge:  Good  Insight:   Good  Judgment:  Good  Impulse Control: Good   Risk Assessment: Danger to Self:  No Patient denied current suicidal ideation  Self-injurious Behavior: No Danger to Others: No Patient denied current homicidal ideation Duty to Warn:no Physical Aggression / Violence:No  Access to Firearms a concern: No  Gang Involvement:No   Subjective: Patient stated, "I'm going to be out of work for 3 weeks", and patient reported concern that he will 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 Patient reported he will have a catheter for three weeks and reported concern that he will not be able to play basketball with his children or participate in other activities due to the catheter. Patient stated, "the recovery period is what is concerning me, the surgery its self is concerning me". Patient reported he is concerned about the removal of the catheter. Patient reported concern related to receiving pain medication after surgery and reported he has experienced hallucinations when taking hydrocodone in the past. Patient stated, "one of the main things I'm worried about is the anesthesia is that I don't come back from it". Patient stated, "its like a bunch of things coming down on me at one time". Patient  reported on the 30th of April patient will find out if he has to have radiation for treatment of patient's pituitary tumor.  Patient stated, "ambivalent" in response to patient's current mood.   Interventions: Cognitive Behavioral Therapy. Clinician conducted session in person at clinician's office at [facility]. Reviewed events since last session and assessed for changes. Processed patient's concerns related to upcoming surgery and recovery. Explored and identified recent thoughts/feelings triggered by upcoming surgery and recovery. Discussed patient contacting patient's physician to ask questions and discuss patient's concerns. Reviewed challenging negative thoughts/cognitive distortions. Challenged statements/cognitive distortions (assumptions) and assisted patient in identifying evidence against negative thoughts associated with surgery/recovery. Provided psycho education related to deep breathing exercises, imagery/visualization, mindfulness exercise of the 5 senses. Guided patient in practicing mindfulness exercise of the 5 senses. Clinician requested for homework patient continue thought record, practice identifying evidence for/against thoughts related to upcoming medical procedures.     Collaboration of Care: not required at this time   Diagnosis:  Generalized anxiety disorder with panic attacks R/O ADHD     Plan: Patient is to utilize [employer], thought re-framing, relaxation techniques, healthy communication strategies, and coping strategies to decrease symptoms associated with their diagnosis. Frequency: bi-weekly  Modality: individual      Long-term goal:   Reduce overall level, frequency, and intensity of the feelings of anxiety and anger as evidenced by decrease in panic attacks, irritability, easily agitated, fluctuation in appetite, difficulty falling asleep and staying asleep, difficulty concentrating, feeling on edge, restlessness,  worry, difficulty  controlling the worry, easily distracted, "always fidgety", difficulty completing  tasks, difficulty with organization, and difficulty staying on tasks from 7 days/week to 0 to 1 days/week per patient report for at least 3 consecutive months. Target Date: 05/05/24  Progress: progressing    Short-term goal:  Identify triggers for anger/irritability   Target Date: 11/03/23  Progress: patient reported he feels this goal has been met    develop and implement coping strategies to utilize in response to feelings of anger/irritability to reduce anger outbursts per patient's report   Target Date: 05/05/24  Progress: progressing    Develop and implement healthy communication strategies for patient to utilize when expressing his thoughts and feelings to others in a controlled and assertive way   Target Date: 05/05/24  Progress: progressing    Identify, challenge, and replace negative core beliefs, thought patterns, and negative self talk that contribute to feelings of anger, anxiety, and low self confidence with positive thoughts, beliefs, and positive self talk per patient's report  Target Date: 05/05/24  Progress: progressing    Develop, establish, and maintain healthy boundaries with others   Target Date: 05/05/24  Progress: progressing     Doree Barthel, LCSW

## 2023-11-30 ENCOUNTER — Other Ambulatory Visit (HOSPITAL_COMMUNITY): Payer: Self-pay

## 2023-12-02 ENCOUNTER — Other Ambulatory Visit (HOSPITAL_COMMUNITY): Payer: Self-pay

## 2023-12-09 ENCOUNTER — Ambulatory Visit: Admitting: Clinical

## 2023-12-09 DIAGNOSIS — F411 Generalized anxiety disorder: Secondary | ICD-10-CM

## 2023-12-09 NOTE — Progress Notes (Signed)
 [facility] Counselor/Therapist Progress Note  Patient ID: Joel Marshall, MRN: [id number],    Date: 12/09/2023  Time Spent: 2:37pm - 3:20pm : 43 minutes   Treatment Type: Individual Therapy  Reported Symptoms: anxiety  Mental Status Exam: Appearance:  Neat and Well Groomed     Behavior: Appropriate  Motor: Normal  Speech/Language:  Clear and Coherent and Normal Rate  Affect: Appropriate  Mood: Patient stated, "I don't think I can put into words how I'm feeling right now"  Thought process: normal  Thought content:   WNL  Sensory/Perceptual disturbances:   WNL  Orientation: oriented to person, place, and situation  Attention: Good  Concentration: Good  Memory: WNL  Fund of knowledge:  Good  Insight:   Good  Judgment:  Good  Impulse Control: Good   Risk Assessment: Danger to Self:  No Patient denied current suicidal ideation  Self-injurious Behavior: No Danger to Others: No Patient denied current homicidal ideation Duty to Warn:no Physical Aggression / Violence:No  Access to Firearms a concern: No  Gang Involvement:No   Subjective: Patient reported patient's wife's aunt passed away yesterday and aunt's power of attorney advised the hospital to not give family any information. Patient reported he may not be able to attend next week's session as he may be in [place] next week for aunt's services. Patient reported he recently received a message that his son's field day is the day of patient's surgery. Patient reported concerns regarding repayment of student loans, missing on call pay due to surgery, length of stay after surgery, and transportation after surgery. Patient stated, "a whole bunch of stuff hitting all at one time".  Patient reported "I don't think I can put into words how I'm feeling right now" in reference to patient's mood. Patient reported patient's son is experiencing tantrums at homes and reported several children are bullying patient's son at the after  school program. Patient stated, "its just so much". Patient reported concern that aunt's power of attorney may be fraudulent and reported the individual has been power of attorney for other individuals. Patient reported aunt's power of attorney advised the hospital aunt did not have family. Patient reported concerns regarding the cost of flying aunt's urn to [place]. Patient stated, "its going" in response to 5 senses exercise. Patient reported thoughts of "can it just be over with" in reference to upcoming MRI and surgery. Patient reported patient plans to ask questions regarding the surgery during patient's pre-op meeting today.   Interventions: Cognitive Behavioral Therapy and supportive therapy . Clinician conducted session in person at clinician's office at [facility]. Reviewed events since last session and assessed for changes. Provided supportive therapy, active listening, and validation as patient discussed the recent loss of wife's aunt and concerns regarding aunt's power of attorney. Discussed current stressors. Discussed strategies to address patient's concerns related to patient's son, such as, pursuing therapy for son/parents, consulting with son's physician, contacting school social worker. Reviewed mindfulness exercise and the outcome. Reviewed challenging thoughts regarding upcoming medical procedures. Clinician requested for homework patient continue to practice identifying evidence for/against thoughts related to upcoming medical procedures.     Collaboration of Care: not required at this time   Diagnosis:  Generalized anxiety disorder with panic attacks R/O ADHD     Plan: Patient is to utilize [employer] Therapy, thought re-framing, relaxation techniques, healthy communication strategies, and coping strategies to decrease symptoms associated with their diagnosis. Frequency: bi-weekly  Modality: individual      Long-term goal:  Reduce overall level, frequency,  and intensity of the feelings of anxiety and anger as evidenced by decrease in panic attacks, irritability, easily agitated, fluctuation in appetite, difficulty falling asleep and staying asleep, difficulty concentrating, feeling on edge, restlessness,  worry, difficulty controlling the worry, easily distracted, "always fidgety", difficulty completing tasks, difficulty with organization, and difficulty staying on tasks from 7 days/week to 0 to 1 days/week per patient report for at least 3 consecutive months. Target Date: 05/05/24  Progress: progressing    Short-term goal:  Identify triggers for anger/irritability   Target Date: 11/03/23  Progress: patient reported he feels this goal has been met    develop and implement coping strategies to utilize in response to feelings of anger/irritability to reduce anger outbursts per patient's report   Target Date: 05/05/24  Progress: progressing    Develop and implement healthy communication strategies for patient to utilize when expressing his thoughts and feelings to others in a controlled and assertive way   Target Date: 05/05/24  Progress: progressing    Identify, challenge, and replace negative core beliefs, thought patterns, and negative self talk that contribute to feelings of anger, anxiety, and low self confidence with positive thoughts, beliefs, and positive self talk per patient's report  Target Date: 05/05/24  Progress: progressing    Develop, establish, and maintain healthy boundaries with others   Target Date: 05/05/24  Progress: progressing     Burlene Carpen, LCSW

## 2023-12-09 NOTE — Progress Notes (Signed)
   Joel Barthel, LCSW

## 2023-12-15 DIAGNOSIS — E291 Testicular hypofunction: Secondary | ICD-10-CM | POA: Diagnosis not present

## 2023-12-15 DIAGNOSIS — D352 Benign neoplasm of pituitary gland: Secondary | ICD-10-CM | POA: Diagnosis not present

## 2023-12-16 ENCOUNTER — Ambulatory Visit (INDEPENDENT_AMBULATORY_CARE_PROVIDER_SITE_OTHER): Admitting: Clinical

## 2023-12-16 DIAGNOSIS — F411 Generalized anxiety disorder: Secondary | ICD-10-CM | POA: Diagnosis not present
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
## 2023-12-16 NOTE — Progress Notes (Signed)
   Joel Barthel, LCSW

## 2023-12-17 ENCOUNTER — Other Ambulatory Visit (HOSPITAL_COMMUNITY): Payer: Self-pay

## 2023-12-20 ENCOUNTER — Other Ambulatory Visit (HOSPITAL_COMMUNITY): Payer: Self-pay

## 2023-12-20 DIAGNOSIS — Z87448 Personal history of other diseases of urinary system: Secondary | ICD-10-CM | POA: Diagnosis not present

## 2023-12-20 MED ORDER — SULFAMETHOXAZOLE-TRIMETHOPRIM 800-160 MG PO TABS
1.0000 | ORAL_TABLET | Freq: Two times a day (BID) | ORAL | 0 refills | Status: DC
  Filled 2023-12-20: qty 6, 3d supply, fill #0

## 2023-12-22 ENCOUNTER — Other Ambulatory Visit (HOSPITAL_COMMUNITY): Payer: Self-pay

## 2023-12-23 DIAGNOSIS — Z91041 Radiographic dye allergy status: Secondary | ICD-10-CM | POA: Diagnosis not present

## 2023-12-23 DIAGNOSIS — R310 Gross hematuria: Secondary | ICD-10-CM | POA: Diagnosis not present

## 2023-12-23 DIAGNOSIS — I10 Essential (primary) hypertension: Secondary | ICD-10-CM | POA: Diagnosis not present

## 2023-12-23 DIAGNOSIS — Z79899 Other long term (current) drug therapy: Secondary | ICD-10-CM | POA: Diagnosis not present

## 2023-12-23 DIAGNOSIS — N35919 Unspecified urethral stricture, male, unspecified site: Secondary | ICD-10-CM | POA: Diagnosis not present

## 2023-12-23 DIAGNOSIS — G4730 Sleep apnea, unspecified: Secondary | ICD-10-CM | POA: Diagnosis not present

## 2023-12-23 DIAGNOSIS — N35914 Unspecified anterior urethral stricture, male: Secondary | ICD-10-CM | POA: Diagnosis not present

## 2023-12-27 ENCOUNTER — Other Ambulatory Visit (HOSPITAL_COMMUNITY): Payer: Self-pay

## 2023-12-27 MED ORDER — TESTOSTERONE 1.62 % TD GEL
3.0000 | Freq: Every day | TRANSDERMAL | 2 refills | Status: DC
Start: 2023-12-27 — End: 2024-07-11
  Filled 2023-12-27: qty 75, 20d supply, fill #0
  Filled 2024-01-26: qty 75, 20d supply, fill #1
  Filled 2024-04-05 – 2024-04-11 (×3): qty 75, 20d supply, fill #2

## 2024-01-04 ENCOUNTER — Other Ambulatory Visit (HOSPITAL_COMMUNITY): Payer: Self-pay

## 2024-01-04 MED ORDER — OXYBUTYNIN CHLORIDE 5 MG PO TABS
5.0000 mg | ORAL_TABLET | Freq: Three times a day (TID) | ORAL | 0 refills | Status: AC | PRN
Start: 2023-12-23 — End: ?
  Filled 2024-01-04: qty 30, 10d supply, fill #0

## 2024-01-11 DIAGNOSIS — Z87448 Personal history of other diseases of urinary system: Secondary | ICD-10-CM | POA: Diagnosis not present

## 2024-01-11 DIAGNOSIS — Z09 Encounter for follow-up examination after completed treatment for conditions other than malignant neoplasm: Secondary | ICD-10-CM | POA: Diagnosis not present

## 2024-01-11 DIAGNOSIS — Z48816 Encounter for surgical aftercare following surgery on the genitourinary system: Secondary | ICD-10-CM | POA: Diagnosis not present

## 2024-01-20 ENCOUNTER — Other Ambulatory Visit (HOSPITAL_COMMUNITY): Payer: Self-pay

## 2024-01-20 ENCOUNTER — Other Ambulatory Visit: Payer: Self-pay

## 2024-01-20 ENCOUNTER — Ambulatory Visit (INDEPENDENT_AMBULATORY_CARE_PROVIDER_SITE_OTHER): Admitting: Clinical

## 2024-01-20 DIAGNOSIS — F411 Generalized anxiety disorder: Secondary | ICD-10-CM | POA: Diagnosis not present
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
## 2024-01-20 NOTE — Progress Notes (Signed)
   Doree Barthel, LCSW

## 2024-01-24 ENCOUNTER — Ambulatory Visit (INDEPENDENT_AMBULATORY_CARE_PROVIDER_SITE_OTHER): Payer: 59 | Admitting: Physician Assistant

## 2024-01-24 ENCOUNTER — Encounter: Payer: Self-pay | Admitting: Physician Assistant

## 2024-01-24 VITALS — BP 130/90 | HR 63 | Temp 97.7°F | Ht 74.0 in | Wt 249.0 lb

## 2024-01-24 DIAGNOSIS — G4733 Obstructive sleep apnea (adult) (pediatric): Secondary | ICD-10-CM | POA: Diagnosis not present

## 2024-01-24 DIAGNOSIS — F419 Anxiety disorder, unspecified: Secondary | ICD-10-CM

## 2024-01-24 DIAGNOSIS — Z0000 Encounter for general adult medical examination without abnormal findings: Secondary | ICD-10-CM

## 2024-01-24 DIAGNOSIS — E782 Mixed hyperlipidemia: Secondary | ICD-10-CM

## 2024-01-24 DIAGNOSIS — I10 Essential (primary) hypertension: Secondary | ICD-10-CM | POA: Diagnosis not present

## 2024-01-24 DIAGNOSIS — D352 Benign neoplasm of pituitary gland: Secondary | ICD-10-CM

## 2024-01-24 DIAGNOSIS — R7303 Prediabetes: Secondary | ICD-10-CM | POA: Diagnosis not present

## 2024-01-24 DIAGNOSIS — E669 Obesity, unspecified: Secondary | ICD-10-CM | POA: Diagnosis not present

## 2024-01-24 DIAGNOSIS — F32A Depression, unspecified: Secondary | ICD-10-CM

## 2024-01-24 LAB — CBC WITH DIFFERENTIAL/PLATELET
Basophils Absolute: 0.1 10*3/uL (ref 0.0–0.1)
Basophils Relative: 2 % (ref 0.0–3.0)
Eosinophils Absolute: 0.2 10*3/uL (ref 0.0–0.7)
Eosinophils Relative: 3.5 % (ref 0.0–5.0)
HCT: 39 % (ref 39.0–52.0)
Hemoglobin: 13.6 g/dL (ref 13.0–17.0)
Lymphocytes Relative: 33 % (ref 12.0–46.0)
Lymphs Abs: 1.6 10*3/uL (ref 0.7–4.0)
MCHC: 34.8 g/dL (ref 30.0–36.0)
MCV: 85.4 fl (ref 78.0–100.0)
Monocytes Absolute: 0.4 10*3/uL (ref 0.1–1.0)
Monocytes Relative: 8.5 % (ref 3.0–12.0)
Neutro Abs: 2.5 10*3/uL (ref 1.4–7.7)
Neutrophils Relative %: 53 % (ref 43.0–77.0)
Platelets: 273 10*3/uL (ref 150.0–400.0)
RBC: 4.57 Mil/uL (ref 4.22–5.81)
RDW: 13.2 % (ref 11.5–15.5)
WBC: 4.8 10*3/uL (ref 4.0–10.5)

## 2024-01-24 LAB — COMPREHENSIVE METABOLIC PANEL WITH GFR
ALT: 22 U/L (ref 0–53)
AST: 21 U/L (ref 0–37)
Albumin: 4.4 g/dL (ref 3.5–5.2)
Alkaline Phosphatase: 47 U/L (ref 39–117)
BUN: 16 mg/dL (ref 6–23)
CO2: 33 meq/L — ABNORMAL HIGH (ref 19–32)
Calcium: 9.6 mg/dL (ref 8.4–10.5)
Chloride: 101 meq/L (ref 96–112)
Creatinine, Ser: 1.53 mg/dL — ABNORMAL HIGH (ref 0.40–1.50)
GFR: 55.4 mL/min — ABNORMAL LOW (ref >60.00)
Glucose, Bld: 88 mg/dL (ref 70–99)
Potassium: 3.8 meq/L (ref 3.5–5.1)
Sodium: 141 meq/L (ref 135–145)
Total Bilirubin: 0.7 mg/dL (ref 0.2–1.2)
Total Protein: 7.6 g/dL (ref 6.0–8.3)

## 2024-01-24 LAB — LIPID PANEL
Cholesterol: 180 mg/dL (ref 0–200)
HDL: 48.3 mg/dL (ref >39.00)
LDL Cholesterol: 110 mg/dL — ABNORMAL HIGH (ref 0–99)
NonHDL: 132.19
Total CHOL/HDL Ratio: 4
Triglycerides: 112 mg/dL (ref 0.0–149.0)
VLDL: 22.4 mg/dL (ref 0.0–40.0)

## 2024-01-24 LAB — HEMOGLOBIN A1C: Hgb A1c MFr Bld: 5.7 % (ref 4.6–6.5)

## 2024-01-24 NOTE — Progress Notes (Signed)
 Subjective:    Joel Marshall is a 43 y.o. male and is here for a comprehensive physical exam.  HPI  There are no preventive care reminders to display for this patient.  Acute Concerns: Uncontrolled Hypertension His cardiologist has recommended that he see a nephrologist due to uncontrolled blood pressure and "lesions in his kidneys"  Currently taking losartan -hydrochloroTHIAZIDE  50-12.5 mg, nebivolol  10 mg daily. At home blood pressure readings are: variable, per patient. Patient denies chest pain, SOB, blurred vision, dizziness, unusual headaches, lower leg swelling. Patient is compliant with medication. Denies excessive caffeine  intake, stimulant usage, excessive alcohol intake, or increase in salt consumption.  BP Readings from Last 3 Encounters:  01/24/24 (!) 136/90  04/06/23 (!) 135/92  01/21/23 (!) 142/90   Chronic Issues: Urethral stricture Had a "buccal onlay substitution urethroplasty with buccal mucosal harvest" Symptom(s) improved   OSA Has significant issues with CPAP use -- does not feel comfortable using it  Pituitary Macroadenoma Has had ongoing follow up with his endocrinologist Take cabergoline  0.5 mg three times a week Reports that the mass has shrunk enough so he will not need radiation  Health Maintenance: Immunizations -- UpToDate  Colonoscopy -- n/a PSA -- No results found for: "PSA1", "PSA" Diet -- healthy diet as able Sleep habits -- difficult sleep at times but will not use cpap Exercise -- as able  Weight -- Weight: 249 lb (112.9 kg)  Recent weight history Wt Readings from Last 10 Encounters:  01/24/24 249 lb (112.9 kg)  01/21/23 258 lb 8 oz (117.3 kg)  11/26/22 263 lb (119.3 kg)  01/19/22 269 lb (122 kg)  11/10/21 267 lb (121.1 kg)  11/05/21 271 lb (122.9 kg)  11/03/21 271 lb 3.2 oz (123 kg)  01/17/21 266 lb 9.6 oz (120.9 kg)  10/09/20 259 lb 12.8 oz (117.8 kg)  01/17/20 264 lb (119.7 kg)   Body mass index is 31.97 kg/m.  Mood -- no  major concerns; seeing a counselor Alcohol use --  reports no history of alcohol use.  Tobacco use --  Tobacco Use: Low Risk  (01/11/2024)   Received from [facility]   Patient History    Smoking Tobacco Use: Never    Smokeless Tobacco Use: Never    Passive Exposure: Past    Eligible for Low Dose CT? no  UTD with eye doctor? yes UTD with dentist? yes     01/24/2024    8:33 AM  Depression screen PHQ 2/9  Decreased Interest 0  Down, Depressed, Hopeless 0  PHQ - 2 Score 0  Altered sleeping 1  Tired, decreased energy 2  Change in appetite 0  Feeling bad or failure about yourself  0  Trouble concentrating 0  Moving slowly or fidgety/restless 0  Suicidal thoughts 0  PHQ-9 Score 3  Difficult doing work/chores Somewhat difficult    Other providers/specialists: Patient Care Team: Alexander Iba, [place] as PCP - General (Physician Assistant)    PMHx, SurgHx, SocialHx, Medications, and Allergies were reviewed in the Visit Navigator and updated as appropriate.   Past Medical History:  Diagnosis Date   Anxiety and depression 11/26/2022   Arthritis    Borderline diabetes    Breast mass 10/15/2016   behind nipple; R   History of intestine removal    Hypertension    Panic attacks    Sleep apnea    had used cpap but has not used it in several months     Past Surgical History:  Procedure Laterality  Date   CYST EXCISION Bilateral 09/07/2019   Procedure: Excision of bilateral lower eye lid cysts;  Surgeon: Thornell Flirt, DO;  Location: [facility];  Service: Plastics;  Laterality: Bilateral;   SMALL INTESTINE SURGERY  1983   "infected" intestine     Family History  Problem Relation Age of Onset   Rheum arthritis Mother    Hypertension Father    HIV Brother    Colon cancer Maternal Uncle 29   Breast cancer Other        cousin   Colon cancer Other 23       cousin    Social History   Tobacco Use   Smoking status: Never    Smokeless tobacco: Never  Vaping Use   Vaping status: Never Used  Substance Use Topics   Alcohol use: No   Drug use: No    Review of Systems:   Review of Systems  Constitutional:  Negative for chills, fever, malaise/fatigue and weight loss.  HENT:  Negative for hearing loss, sinus pain and sore throat.   Respiratory:  Negative for cough and hemoptysis.   Cardiovascular:  Negative for chest pain, palpitations, leg swelling and PND.  Gastrointestinal:  Negative for abdominal pain, constipation, diarrhea, heartburn, nausea and vomiting.  Genitourinary:  Negative for dysuria, frequency and urgency.  Musculoskeletal:  Negative for back pain, myalgias and neck pain.  Skin:  Negative for itching and rash.  Neurological:  Negative for dizziness, tingling, seizures and headaches.  Endo/Heme/Allergies:  Negative for polydipsia.  Psychiatric/Behavioral:  Negative for depression. The patient is not nervous/anxious.     Objective:   General  Alert, cooperative, no distress, appears stated age  Head:  Normocephalic, without obvious abnormality, atraumatic  Eyes:  PERRL, conjunctiva/corneas clear, EOM's intact, fundi benign, both eyes       Ears:  Normal TM's and external ear canals, both ears  Nose: Nares normal, septum midline, mucosa normal, no drainage or sinus tenderness  Throat: Lips, mucosa, and tongue normal; teeth and gums normal  Neck: Supple, symmetrical, trachea midline, no adenopathy;     thyroid :  No enlargement/tenderness/nodules; no carotid bruit or JVD  Back:   Symmetric, no curvature, ROM normal, no CVA tenderness  Lungs:   Clear to auscultation bilaterally, respirations unlabored  Chest wall:  No tenderness or deformity  Heart:  Regular rate and rhythm, S1 and S2 normal, no murmur, rub or gallop  Abdomen:   Soft, non-tender, bowel sounds active all four quadrants, no masses, no organomegaly  Extremities: Extremities normal, atraumatic, no cyanosis or edema  Prostate :  Deferred   Skin: Skin color, texture, turgor normal, no rashes or lesions  Lymph nodes: Cervical, supraclavicular, and axillary nodes normal  Neurologic: CNII-XII grossly intact. Normal strength, sensation and reflexes throughout   AssessmentPlan:   Routine physical examination Today patient counseled on age appropriate routine health concerns for screening and prevention, each reviewed and up to date or declined. Immunizations reviewed and up to date or declined. Labs ordered and reviewed. Risk factors for depression reviewed and negative. Hearing function and visual acuity are intact. ADLs screened and addressed as needed. Functional ability and level of safety reviewed and appropriate. Education, counseling and referrals performed based on assessed risks today. Patient provided with a copy of personalized plan for preventive services.  Pre-diabetes Update Hemoglobin A1c and provide recommendations   Essential hypertension Above goal today No evidence of end-organ damage on my exam Recommend patient monitor home blood pressure at  least a few times weekly Continue losartan -hydrochloroTHIAZIDE  50-12.5 mg, nebivolol  10 mg daily mg daily Referral to nephrology per his request If home monitoring shows consistent elevation, or any symptom(s) develop, recommend reach out to us  for further advice on next steps  Mixed hyperlipidemia Update lipid panel Continue Zetia  10 mg daily  Anxiety and depression Overall stable Seeing therapist regularly  Pituitary macroadenoma (HCC) Stable Compliant with endocrinology and ophthalmology   Obesity, unspecified class, unspecified obesity type, unspecified whether serious comorbidity present Continue efforts at regular exercise and healthy lifestyle   Alexander Iba, PA-C [facility] [place] [facility]

## 2024-01-24 NOTE — Patient Instructions (Addendum)
 It was great to see you!  I will place referral for [facility]  Please go to the lab for blood work.   Our office will call you with your results unless you have chosen to receive results via MyChart.  If your blood work is normal we will follow-up each year for physicals and as scheduled for chronic medical problems.  If anything is abnormal we will treat accordingly and get you in for a follow-up.  Take care,  Sher Hellinger

## 2024-01-26 ENCOUNTER — Other Ambulatory Visit: Payer: Self-pay

## 2024-01-26 ENCOUNTER — Ambulatory Visit: Payer: Self-pay | Admitting: Physician Assistant

## 2024-01-26 DIAGNOSIS — R944 Abnormal results of kidney function studies: Secondary | ICD-10-CM

## 2024-02-01 ENCOUNTER — Other Ambulatory Visit (HOSPITAL_COMMUNITY): Payer: Self-pay

## 2024-02-01 DIAGNOSIS — D352 Benign neoplasm of pituitary gland: Secondary | ICD-10-CM | POA: Diagnosis not present

## 2024-02-01 DIAGNOSIS — E291 Testicular hypofunction: Secondary | ICD-10-CM | POA: Diagnosis not present

## 2024-02-01 DIAGNOSIS — I10 Essential (primary) hypertension: Secondary | ICD-10-CM | POA: Diagnosis not present

## 2024-02-01 DIAGNOSIS — R7303 Prediabetes: Secondary | ICD-10-CM | POA: Diagnosis not present

## 2024-02-01 MED ORDER — CABERGOLINE 0.5 MG PO TABS
0.5000 mg | ORAL_TABLET | ORAL | 1 refills | Status: AC
  Filled 2024-02-01 – 2024-04-18 (×3): qty 24, 84d supply, fill #0

## 2024-02-03 ENCOUNTER — Ambulatory Visit (INDEPENDENT_AMBULATORY_CARE_PROVIDER_SITE_OTHER): Admitting: Clinical

## 2024-02-03 DIAGNOSIS — F411 Generalized anxiety disorder: Secondary | ICD-10-CM | POA: Diagnosis not present
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
## 2024-02-03 NOTE — Progress Notes (Signed)
   Doree Barthel, LCSW

## 2024-02-17 ENCOUNTER — Ambulatory Visit: Admitting: Clinical

## 2024-02-17 ENCOUNTER — Other Ambulatory Visit: Payer: Self-pay

## 2024-02-17 DIAGNOSIS — F411 Generalized anxiety disorder: Secondary | ICD-10-CM | POA: Diagnosis not present

## 2024-02-17 NOTE — Progress Notes (Signed)
 [facility] Counselor/Therapist Progress Note  Patient ID: Lexx Monte, MRN: [id number],    Date: 02/17/2024  Time Spent: 2:33pm - 3:28pm : 55 minutes   Treatment Type: Individual Therapy  Reported Symptoms: Patient reported no recent feelings of anger  Mental Status Exam: Appearance:  Neat and Well Groomed     Behavior: Appropriate  Motor: Normal  Speech/Language:  Clear and Coherent and Normal Rate  Affect: Appropriate  Mood: normal  Thought process: normal  Thought content:   WNL  Sensory/Perceptual disturbances:   WNL  Orientation: oriented to person, place, time/date, and situation  Attention: Good  Concentration: Good  Memory: WNL  Fund of knowledge:  Good  Insight:   Good  Judgment:  Good  Impulse Control: Good   Risk Assessment: Danger to Self:  No Patient denied current suicidal ideation  Self-injurious Behavior: No Danger to Others: No Patient denied current homicidal ideation Duty to Warn:no Physical Aggression / Violence:No  Access to Firearms a concern: No  Gang Involvement:No   Subjective: Patient stated, it's been going great in response to events since last session. Patient reported patient has returned to work and is getting acclimated to being back at work. Patient stated, it is definitely peaceful without the kids running and screaming. patient reported patient's son was crying in response to grandmother changing television channel and patient spoke with son on the phone regarding son's behaviors. Patient stated, I had to temper myself because I try not to curse in front of my mother in law in reference to recent conversation with mother in law and son. Patient reported patient's son cries when he is told no and patient reported patient would like son to stop crying. Patient reported a fear of sharing the same parenting style as patient's father. Patient reported patient's father did not reward patient for positive behaviors and items were  taken from patient as a form of punishment. Patient stated, there is no pleasing him (father) and reported patient shares similar punishment styles with his father. Patient reported patient rewards his children when children exhibit positive behaviors Patient reported patient's father did not yell. Patient stated, dad never had time to hear what I had to say. Patient reported patient listens to his children, is overprotective of children, shows children affection, provides positive feedback to his children, attends children's events, and helps with children's homework. Patient reported feeling others do not care about patient's feelings, avoiding being vulnerable, and fear of thoughts/feelings being used against patient at a later time are barriers to patient expressing his thoughts/feelings.   Interventions: Cognitive Behavioral Therapy. Clinician conducted session in person at clinician's office at [facility]. Reviewed events since last session and assessed for changes. Explored contributing factors to patient's statement, its been going great. Reflected back to patient observations in patient's response to mother in law and patient's son. Explored patterns in patient's emotional responses and son's emotional responses. Provided psycho education related to communication. Challenged thoughts/statements to assist patient in reframing situations. Explored differences in patient/father's parenting styles. Provided psycho education related to cognitive distortions/unhelpful thinking styles, such as, disqualifying the positives. Explored barriers to expressing emotions. Provided psycho education related to anger iceberg. Clinician requested for homework patient continue thought record and practice identifying additional emotions connected to anger and practice opposite action.    Collaboration of Care: not required at this time   Diagnosis:  Generalized anxiety disorder with panic attacks R/O  ADHD     Plan: Patient is to utilize Cognitive  Behavioral Therapy, thought re-framing, relaxation techniques, healthy communication strategies, and coping strategies to decrease symptoms associated with their diagnosis. Frequency: bi-weekly  Modality: individual      Long-term goal:   Reduce overall level, frequency, and intensity of the feelings of anxiety and anger as evidenced by decrease in panic attacks, irritability, easily agitated, fluctuation in appetite, difficulty falling asleep and staying asleep, difficulty concentrating, feeling on edge, restlessness,  worry, difficulty controlling the worry, easily distracted, always fidgety, difficulty completing tasks, difficulty with organization, and difficulty staying on tasks from 7 days/week to 0 to 1 days/week per patient report for at least 3 consecutive months. Target Date: 05/05/24  Progress: progressing    Short-term goal:  Identify triggers for anger/irritability   Target Date: 11/03/23  Progress: patient reported he feels this goal has been met    develop and implement coping strategies to utilize in response to feelings of anger/irritability to reduce anger outbursts per patient's report   Target Date: 05/05/24  Progress: progressing    Develop and implement healthy communication strategies for patient to utilize when expressing his thoughts and feelings to others in a controlled and assertive way   Target Date: 05/05/24  Progress: progressing    Identify, challenge, and replace negative core beliefs, thought patterns, and negative self talk that contribute to feelings of anger, anxiety, and low self confidence with positive thoughts, beliefs, and positive self talk per patient's report  Target Date: 05/05/24  Progress: progressing    Develop, establish, and maintain healthy boundaries with others   Target Date: 05/05/24  Progress: progressing     Darice Seats, LCSW

## 2024-02-17 NOTE — Progress Notes (Signed)
   Joel Barthel, LCSW

## 2024-03-09 ENCOUNTER — Ambulatory Visit: Admitting: Clinical

## 2024-03-09 DIAGNOSIS — F411 Generalized anxiety disorder: Secondary | ICD-10-CM | POA: Diagnosis not present

## 2024-03-09 NOTE — Progress Notes (Signed)
   Joel Barthel, LCSW

## 2024-03-09 NOTE — Progress Notes (Signed)
 [facility] Counselor/Therapist Progress Note  Patient ID: Joel Marshall, MRN: [id number],    Date: 03/09/2024  Time Spent: 2:34pm -  3:21pm : 47 minutes   Treatment Type: Individual Therapy  Reported Symptoms: none reported   Mental Status Exam: Appearance:  Neat and Well Groomed     Behavior: Appropriate  Motor: Normal  Speech/Language:  Clear and Coherent and Normal Rate  Affect: Appropriate  Mood: normal  Thought process: normal  Thought content:   WNL  Sensory/Perceptual disturbances:   WNL  Orientation: oriented to person, place, time/date, and situation  Attention: Good  Concentration: Good  Memory: WNL  Fund of knowledge:  Good  Insight:   Good  Judgment:  Good  Impulse Control: Good   Risk Assessment: Danger to Self:  No Patient denied current suicidal ideation  Self-injurious Behavior: No Danger to Others: No Patient denied current homicidal ideation Duty to Warn:no Physical Aggression / Violence:No  Access to Firearms a concern: No  Gang Involvement:No   Subjective: Patient stated, everything else has been going for the most part good and stated, everything else has been going great in response to events since last session. Patient reported patient's daughter lied to grandmother and was watching inappropriate content on a media platform. Patient reported feelings of disappointment in response to daughter's behaviors. Patient reported patient did not yell or scream in response to daughter's behaviors. Patient reported patient's mood has been tranquil, serene. Patient reported no entries in patient's thought record and no situations have occurred to practice use of anger iceberg or opposite action. Patient stated, not having to deal with hollering, screaming and crying all the time has contributed to stability in mood. Patient reported patient is concerned about his son resuming a routine prior to starting school. Patient reported patient's  daughter will not eat healthy food options and has been scraping her food in the trash. Patient reported patient's son is mimicking daughter's behaviors as it relates to food. Patient reported patient/wife have decided to refrain from purchasing unhealthy foods/snacks to encourage family members to make healthier choices. Patient acknowledged feelings of disappointment and sadness can be beneath the anger iceberg. Patient reported feelings of sadness in response to several celebrity deaths. Patient stated, the one that hurt me the most was Gwendlyn Burnard Donovan because I conversed with the man. Patient reported meeting celebrity and reported their conversation motivated patient to start writing.   Interventions: Cognitive Behavioral Therapy and supportive therapy. Clinician conducted session in person at clinician's office at [facility]. Reviewed events since last session and assessed for changes. Discussed emotions triggered by daughter's behaviors and patient's response to daughter. Explored and identified contributing factors to stability in mood. Discussed patient's concerns related to children behaviors. Provided psycho education related to use of a reward system. Reviewed anger iceberg and opposite action. Provided supportive therapy, active listening, and validation as patient discussed recent celebrity deaths and the impact on patient. Clinician requested for homework patient continue thought record and practice identifying additional emotions connected to anger and practice opposite action.    Collaboration of Care: not required at this time   Diagnosis:  Generalized anxiety disorder with panic attacks R/O ADHD     Plan: Patient is to utilize [employer] Therapy, thought re-framing, relaxation techniques, healthy communication strategies, and coping strategies to decrease symptoms associated with their diagnosis. Frequency: bi-weekly  Modality: individual      Long-term  goal:   Reduce overall level, frequency, and intensity of the feelings  of anxiety and anger as evidenced by decrease in panic attacks, irritability, easily agitated, fluctuation in appetite, difficulty falling asleep and staying asleep, difficulty concentrating, feeling on edge, restlessness,  worry, difficulty controlling the worry, easily distracted, always fidgety, difficulty completing tasks, difficulty with organization, and difficulty staying on tasks from 7 days/week to 0 to 1 days/week per patient report for at least 3 consecutive months. Target Date: 05/05/24  Progress: progressing    Short-term goal:  Identify triggers for anger/irritability   Target Date: 11/03/23  Progress: patient reported he feels this goal has been met    develop and implement coping strategies to utilize in response to feelings of anger/irritability to reduce anger outbursts per patient's report   Target Date: 05/05/24  Progress: progressing    Develop and implement healthy communication strategies for patient to utilize when expressing his thoughts and feelings to others in a controlled and assertive way   Target Date: 05/05/24  Progress: progressing    Identify, challenge, and replace negative core beliefs, thought patterns, and negative self talk that contribute to feelings of anger, anxiety, and low self confidence with positive thoughts, beliefs, and positive self talk per patient's report  Target Date: 05/05/24  Progress: progressing    Develop, establish, and maintain healthy boundaries with others   Target Date: 05/05/24  Progress: progressing     Darice Seats, LCSW

## 2024-03-14 ENCOUNTER — Other Ambulatory Visit: Payer: Self-pay

## 2024-03-14 ENCOUNTER — Other Ambulatory Visit: Payer: Self-pay | Admitting: Physician Assistant

## 2024-03-14 ENCOUNTER — Other Ambulatory Visit (HOSPITAL_COMMUNITY): Payer: Self-pay

## 2024-03-14 MED ORDER — NEBIVOLOL HCL 10 MG PO TABS
10.0000 mg | ORAL_TABLET | Freq: Every day | ORAL | 1 refills | Status: AC
  Filled 2024-03-14: qty 90, 90d supply, fill #0

## 2024-03-14 MED ORDER — LOSARTAN POTASSIUM-HCTZ 50-12.5 MG PO TABS
1.0000 | ORAL_TABLET | Freq: Every day | ORAL | 1 refills | Status: AC
  Filled 2024-03-14: qty 90, 90d supply, fill #0
  Filled 2024-07-09: qty 90, 90d supply, fill #1

## 2024-03-23 ENCOUNTER — Ambulatory Visit (INDEPENDENT_AMBULATORY_CARE_PROVIDER_SITE_OTHER): Admitting: Clinical

## 2024-03-23 DIAGNOSIS — F411 Generalized anxiety disorder: Secondary | ICD-10-CM | POA: Diagnosis not present

## 2024-03-23 NOTE — Progress Notes (Signed)
   Doree Barthel, LCSW

## 2024-03-23 NOTE — Progress Notes (Signed)
 [facility] Counselor/Therapist Progress Note  Patient ID: Joel Marshall, MRN: [id number],    Date: 03/23/2024  Time Spent: 2:35pm - 3:24pm : 49 minutes   Treatment Type: Individual Therapy  Reported Symptoms: none reported  Mental Status Exam: Appearance:  Neat and Well Groomed     Behavior: Appropriate  Motor: Normal  Speech/Language:  Clear and Coherent and Normal Rate  Affect: Appropriate  Mood: normal  Thought process: normal  Thought content:   WNL  Sensory/Perceptual disturbances:   WNL  Orientation: oriented to person, place, time/date, and situation  Attention: Good  Concentration: Good  Memory: WNL  Fund of knowledge:  Good  Insight:   Good  Judgment:  Good  Impulse Control: Good   Risk Assessment: Danger to Self:  No Patient denied current suicidal ideation  Self-injurious Behavior: No Danger to Others: No Patient denied current homicidal ideation Duty to Warn:no Physical Aggression / Violence:No  Access to Firearms a concern: No  Gang Involvement:No   Subjective:  Patient stated, he (son) still whines and cries when you tell him no and to go to bed, but he's gotten a little bit better over the past couple of weeks. Patient stated, I haven't yelled and screamed so that's a plus and patient reported no cursing in response to stressors. Patient stated, letting mom deal with it for the most part in reference to coping strategies and son's behaviors. Patient stated, my biggest issue is he will not stop talking if someone is on the phone. Patient reported patient interrupts others and blurts out in conversations. Patient stated, if I wait my turn ill never get a chance to talk and reported patient's response is due to patient's experience as a child. Patient reported patient chooses not to speak at times due to fear of others' reactions.  Patient reported patient vocalized patient's thoughts/feelings regarding a project at work and patient reported  patient's feedback was shut down. Patient reported patient refrained from talking as a result. Patient stated, I didn't know what I was talking about, that's how it made me feel. Patient reported patient was concerned if patient responded the response would impact patient's merit increase. Patient stated, on one hand I don't want to come across as pushy and reported on the other hand, only so much I can take, I'm trying to balance the two so I'm neutral. Patient stated, I have zero stress tolerance now. Patient reported patient did not complete homework assignments due to lack of distressing events/distressing emotions. Patient stated, for the most part pretty stable in response to patent's mood.   Interventions: Cognitive Behavioral Therapy. Clinician conducted session in person at clinician's office at [facility]. Reviewed events since last session and assessed for changes. Reviewed patient's behavioral response to recent triggers. Explored coping strategies patient has implemented in response to feelings of anger. Provided psycho education related to symptoms of ADHD. Explored and identified triggers for interrupting/blurting out and thoughts associated with interrupting/blurting out. Processed conflict at work and patient's response. Explored barriers to patient verbalizing thoughts/feelings at work. Challenged statements/thoughts to assist patient in reframing work related situation. Provided psycho education related to DBT skills and distress tolerance. Reviewed patient's homework. Clinician requested for homework patient continue thought record and practice identifying additional emotions connected to anger and practice opposite action.    Collaboration of Care: not required at this time   Diagnosis:  Generalized anxiety disorder with panic attacks R/O ADHD     Plan: Patient is to utilize Cognitive  Behavioral Therapy, thought re-framing, relaxation techniques, healthy  communication strategies, and coping strategies to decrease symptoms associated with their diagnosis. Frequency: bi-weekly  Modality: individual      Long-term goal:   Reduce overall level, frequency, and intensity of the feelings of anxiety and anger as evidenced by decrease in panic attacks, irritability, easily agitated, fluctuation in appetite, difficulty falling asleep and staying asleep, difficulty concentrating, feeling on edge, restlessness,  worry, difficulty controlling the worry, easily distracted, always fidgety, difficulty completing tasks, difficulty with organization, and difficulty staying on tasks from 7 days/week to 0 to 1 days/week per patient report for at least 3 consecutive months. Target Date: 05/05/24  Progress: progressing    Short-term goal:  Identify triggers for anger/irritability   Target Date: 11/03/23  Progress: patient reported he feels this goal has been met    develop and implement coping strategies to utilize in response to feelings of anger/irritability to reduce anger outbursts per patient's report   Target Date: 05/05/24  Progress: progressing    Develop and implement healthy communication strategies for patient to utilize when expressing his thoughts and feelings to others in a controlled and assertive way   Target Date: 05/05/24  Progress: progressing    Identify, challenge, and replace negative core beliefs, thought patterns, and negative self talk that contribute to feelings of anger, anxiety, and low self confidence with positive thoughts, beliefs, and positive self talk per patient's report  Target Date: 05/05/24  Progress: progressing    Develop, establish, and maintain healthy boundaries with others   Target Date: 05/05/24  Progress: progressing       Darice Seats, LCSW

## 2024-03-31 ENCOUNTER — Other Ambulatory Visit: Payer: Self-pay

## 2024-04-04 ENCOUNTER — Other Ambulatory Visit (HOSPITAL_COMMUNITY): Payer: Self-pay

## 2024-04-04 DIAGNOSIS — I10 Essential (primary) hypertension: Secondary | ICD-10-CM | POA: Diagnosis not present

## 2024-04-04 DIAGNOSIS — E291 Testicular hypofunction: Secondary | ICD-10-CM | POA: Diagnosis not present

## 2024-04-04 DIAGNOSIS — R7303 Prediabetes: Secondary | ICD-10-CM | POA: Diagnosis not present

## 2024-04-04 DIAGNOSIS — D352 Benign neoplasm of pituitary gland: Secondary | ICD-10-CM | POA: Diagnosis not present

## 2024-04-04 DIAGNOSIS — E782 Mixed hyperlipidemia: Secondary | ICD-10-CM | POA: Diagnosis not present

## 2024-04-04 MED ORDER — NEBIVOLOL HCL 20 MG PO TABS
20.0000 mg | ORAL_TABLET | Freq: Every day | ORAL | 3 refills | Status: AC
  Filled 2024-04-04: qty 90, 90d supply, fill #0
  Filled 2024-07-09: qty 90, 90d supply, fill #1

## 2024-04-05 ENCOUNTER — Other Ambulatory Visit (HOSPITAL_COMMUNITY): Payer: Self-pay

## 2024-04-06 ENCOUNTER — Ambulatory Visit (INDEPENDENT_AMBULATORY_CARE_PROVIDER_SITE_OTHER): Admitting: Clinical

## 2024-04-06 DIAGNOSIS — F411 Generalized anxiety disorder: Secondary | ICD-10-CM | POA: Diagnosis not present

## 2024-04-06 NOTE — Progress Notes (Signed)
 [facility] Counselor/Therapist Progress Note  Patient ID: Joel Marshall, MRN: [id number],    Date: 04/06/2024  Time Spent: 2:37pm - 3:30pm : 53 minutes   Treatment Type: Individual Therapy  Reported Symptoms: patient reported anger/irritability twice since last session  Mental Status Exam: Appearance:  Neat and Well Groomed     Behavior: Appropriate  Motor: Normal  Speech/Language:  Clear and Coherent and Normal Rate  Affect: Appropriate  Mood: normal  Thought process: normal  Thought content:   WNL  Sensory/Perceptual disturbances:   WNL  Orientation: oriented to person, place, time/date, and situation  Attention: Good  Concentration: Good  Memory: WNL  Fund of knowledge:  Good  Insight:   Good  Judgment:  Good  Impulse Control: Good   Risk Assessment: Danger to Self:  No Patient denied current suicidal ideation  Self-injurious Behavior: No Danger to Others: No Patient denied current homicidal ideation Duty to Warn:no Physical Aggression / Violence:No  Access to Firearms a concern: No  Gang Involvement:No   Subjective: Patient stated, things are pretty much back to normal with both kids in the house. Patient reported patient has yelled twice since last session and patient reported being on a phone call for work when incident occurred. Patient reported difficulty hearing work related call due to son speaking in the background. Patient reported son cries when being asked to bathe/shower. Patient reported recently patient asked his son about son's feelings related to bathing/showering and utilized yes/no questions to inquire about son's feelings. Patient reported patient is concerned about how patient is going to pick up wife and children tomorrow. Patient reported overall good in response to patient's mood. Patient reported patient practiced opposite action in response to son's behaviors recently. Patient reported son has an aversion to showering and reported  son doesn't like the water hitting son's head. Patient reported no symptoms of anxiety since last session. Patient stated, it worked a whole better than expected in response to use of opposite action.  Interventions: Cognitive Behavioral Therapy and problem solved. Clinician conducted session in person at clinician's office at [facility]. Reviewed events since last session and assessed for changes. Discussed recent triggers for anger/irritability and processed patient's behavioral responses. Discussed patient's concern as it relates to son's response to bathing. Explored strategies to make bathing a positive experience for son, such as, allowing son to play with bath toys, incorporating music, etc. Reviewed patient's use of opposite action and the outcome. Assisted patient in problem solving as it relates to providing transportation tomorrow. Clinician requested for homework patient continue thought record and practice identifying additional emotions connected to anger and practice opposite action.    Collaboration of Care: not required at this time   Diagnosis:  Generalized anxiety disorder with panic attacks R/O ADHD     Plan: Patient is to utilize [employer] Therapy, thought re-framing, relaxation techniques, healthy communication strategies, and coping strategies to decrease symptoms associated with their diagnosis. Frequency: bi-weekly  Modality: individual      Long-term goal:   Reduce overall level, frequency, and intensity of the feelings of anxiety and anger as evidenced by decrease in panic attacks, irritability, easily agitated, fluctuation in appetite, difficulty falling asleep and staying asleep, difficulty concentrating, feeling on edge, restlessness,  worry, difficulty controlling the worry, easily distracted, always fidgety, difficulty completing tasks, difficulty with organization, and difficulty staying on tasks from 7 days/week to 0 to 1 days/week per  patient report for at least 3 consecutive months. Target Date: 05/05/24  Progress: progressing    Short-term goal:  Identify triggers for anger/irritability   Target Date: 11/03/23  Progress: patient reported he feels this goal has been met    develop and implement coping strategies to utilize in response to feelings of anger/irritability to reduce anger outbursts per patient's report   Target Date: 05/05/24  Progress: progressing    Develop and implement healthy communication strategies for patient to utilize when expressing his thoughts and feelings to others in a controlled and assertive way   Target Date: 05/05/24  Progress: progressing    Identify, challenge, and replace negative core beliefs, thought patterns, and negative self talk that contribute to feelings of anger, anxiety, and low self confidence with positive thoughts, beliefs, and positive self talk per patient's report  Target Date: 05/05/24  Progress: progressing    Develop, establish, and maintain healthy boundaries with others   Target Date: 05/05/24  Progress: progressing       Darice Seats, LCSW

## 2024-04-06 NOTE — Progress Notes (Signed)
   Joel Seats, LCSW

## 2024-04-10 ENCOUNTER — Other Ambulatory Visit (HOSPITAL_COMMUNITY): Payer: Self-pay

## 2024-04-10 ENCOUNTER — Other Ambulatory Visit: Payer: Self-pay

## 2024-04-18 ENCOUNTER — Other Ambulatory Visit (HOSPITAL_COMMUNITY): Payer: Self-pay

## 2024-04-19 ENCOUNTER — Other Ambulatory Visit: Payer: Self-pay

## 2024-04-20 ENCOUNTER — Ambulatory Visit: Admitting: Clinical

## 2024-04-20 DIAGNOSIS — F411 Generalized anxiety disorder: Secondary | ICD-10-CM | POA: Diagnosis not present
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
## 2024-04-20 NOTE — Progress Notes (Signed)
   Darice Seats, LCSW

## 2024-04-27 ENCOUNTER — Other Ambulatory Visit: Payer: Self-pay

## 2024-05-04 ENCOUNTER — Ambulatory Visit (INDEPENDENT_AMBULATORY_CARE_PROVIDER_SITE_OTHER): Admitting: Clinical

## 2024-05-04 DIAGNOSIS — F411 Generalized anxiety disorder: Secondary | ICD-10-CM
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
## 2024-05-04 NOTE — Progress Notes (Signed)
   Darice Seats, LCSW

## 2024-05-09 DIAGNOSIS — Z87448 Personal history of other diseases of urinary system: Secondary | ICD-10-CM | POA: Diagnosis not present

## 2024-05-09 DIAGNOSIS — R918 Other nonspecific abnormal finding of lung field: Secondary | ICD-10-CM | POA: Diagnosis not present

## 2024-05-18 ENCOUNTER — Ambulatory Visit (INDEPENDENT_AMBULATORY_CARE_PROVIDER_SITE_OTHER): Admitting: Clinical

## 2024-05-18 DIAGNOSIS — F411 Generalized anxiety disorder: Secondary | ICD-10-CM | POA: Diagnosis not present

## 2024-05-18 NOTE — Progress Notes (Addendum)
 [facility] Counselor/Therapist Progress Note  Patient ID: Joel Marshall, MRN: [id number],    Date: 05/18/2024  Time Spent: 2:36pm - 3:35pm : 59 minutes   Treatment Type: Individual Therapy  Reported Symptoms: irritability  Mental Status Exam: Appearance:  Neat and Well Groomed     Behavior: Appropriate  Motor: Normal  Speech/Language:  Clear and Coherent and Normal Rate  Affect: Appropriate  Mood: normal  Thought process: normal  Thought content:   WNL  Sensory/Perceptual disturbances:   WNL  Orientation: oriented to person, place, time/date, and situation  Attention: Good  Concentration: Good  Memory: WNL  Fund of knowledge:  Good  Insight:   Good  Judgment:  Good  Impulse Control: Good   Risk Assessment: Danger to Self:  No Patient denied current suicidal ideation  Self-injurious Behavior: No Danger to Others: No Patient denied current homicidal ideation Duty to Warn:no Physical Aggression / Violence:No  Access to Firearms a concern: No  Gang Involvement:No   Subjective:  Patient stated, I have been a tad more irritable, I just have zero tolerance for crying and whining or stupidity on the roads. Patient stated, September is always a difficult month for me because that's when my grandmother died. Patient reported a friend informed patient about friend's husband's history of infidelity in her marriage and patient stated, that one kind of pissed me off in reference to friend's information. Patient reported thoughts of people that have passed, such as, patient's grandmother and friend are triggers for irritability. Patient stated, his death was a hard one in reference to friend's death in June 17, 2010. Patient reported traffic continues to be a trigger for irritability and stated, that one is not going to go anywhere. Patient reported finances are a trigger for irritability. In reviewing patient's homework patient noted patient's parents frequently stated  patient would be sent to a mental institution if patient did not behave. In addition, patient noted patient's father responded to patient initiating conversations with father by replying I don't want to hear it. Patient reported shutting down and stated, Its still hard for me to open up in response to father's reply. In reviewing patient's homework patient noted multiple situations related to patient's parents, relationships, and feeling judgement from others that trigger feelings of resentment and anger. Patient reported feeling patient's parents were neglectful by not being there for me for my milestones. Patient reported feeling unheard, voiceless in response to situations noted in patient's homework. Patient reported triggers noted in patient's homework had a significant impact on patient's core beliefs. Patient identified anxiety attacks and stated,  it could have been cause of my tumor as costs associated with feelings of anger.   Interventions: Cognitive Behavioral Therapy. Clinician conducted session in person at clinician's office at [facility]. Reviewed events since last session and assessed for changes. Assisted patient in exploring and identifying triggers for irritability and patterns in mood. Provided psycho education related to grief and emotions associated with grief. Reviewed patient's homework to identify childhood experiences that trigger anger. Explored and identified emotions triggered by childhood experiences. Discussed the impact of father's response on patient's communication. Explored connection between childhood experiences and patient's core beliefs. Provided psycho education related to core beliefs. Explored costs of anger. Provided psycho education related to forgiveness. Clinician requested for homework patient write a letter to parents.    Collaboration of Care: not required at this time   Diagnosis:  Generalized anxiety disorder with panic attacks R/O  ADHD     Plan:  Patient is to utilize [employer] Therapy, thought re-framing, relaxation techniques, healthy communication strategies, and coping strategies to decrease symptoms associated with their diagnosis. Frequency: bi-weekly  Modality: individual      Long-term goal:   Reduce overall level, frequency, and intensity of the feelings of anxiety and anger as evidenced by decrease in panic attacks, irritability, easily agitated, fluctuation in appetite, difficulty falling asleep and staying asleep, difficulty concentrating, feeling on edge, restlessness,  worry, difficulty controlling the worry, easily distracted, always fidgety, difficulty completing tasks, difficulty with organization, and difficulty staying on tasks from 7 days/week to 0 to 1 days/week per patient report for at least 3 consecutive months. Target Date: 05/05/24 - target date extended to follow up appointment on 06/08/24 due to patient's clinical needs at time of session and limited time to review progress towards goals  Progress: progressing    Short-term goal:  Identify triggers for anger/irritability   Target Date: 11/03/23  Progress: patient reported he feels this goal has been met    develop and implement coping strategies to utilize in response to feelings of anger/irritability to reduce anger outbursts per patient's report   Target Date: 05/05/24 - target date extended to follow up appointment on 06/08/24 due to patient's clinical needs at time of session and limited time to review progress towards goals  Progress: progressing    Develop and implement healthy communication strategies for patient to utilize when expressing his thoughts and feelings to others in a controlled and assertive way   Target Date: 05/05/24 - target date extended to follow up appointment on 06/08/24 due to patient's clinical needs at time of session and limited time to review progress towards goals  Progress: progressing    Identify,  challenge, and replace negative core beliefs, thought patterns, and negative self talk that contribute to feelings of anger, anxiety, and low self confidence with positive thoughts, beliefs, and positive self talk per patient's report  Target Date: 05/05/24 - target date extended to follow up appointment on 06/08/24 due to patient's clinical needs at time of session and limited time to review progress towards goals  Progress: progressing    Develop, establish, and maintain healthy boundaries with others   Target Date: 05/05/24 - target date extended to follow up appointment on 06/08/24 due to patient's clinical needs at time of session and limited time to review progress towards goals  Progress: progressing    Darice Seats, LCSW

## 2024-05-18 NOTE — Progress Notes (Signed)
   Darice Seats, LCSW

## 2024-05-28 ENCOUNTER — Other Ambulatory Visit (HOSPITAL_COMMUNITY): Payer: Self-pay

## 2024-05-29 ENCOUNTER — Other Ambulatory Visit (HOSPITAL_COMMUNITY): Payer: Self-pay

## 2024-05-30 ENCOUNTER — Other Ambulatory Visit (HOSPITAL_COMMUNITY): Payer: Self-pay

## 2024-05-30 MED ORDER — TESTOSTERONE 1.62 % TD GEL
3.0000 | Freq: Every day | TRANSDERMAL | 2 refills | Status: AC
  Filled 2024-05-30: qty 75, 20d supply, fill #0
  Filled 2024-06-18: qty 75, 20d supply, fill #1
  Filled 2024-08-03: qty 75, 20d supply, fill #2

## 2024-06-08 ENCOUNTER — Ambulatory Visit: Admitting: Clinical

## 2024-06-19 ENCOUNTER — Other Ambulatory Visit: Payer: Self-pay

## 2024-06-22 ENCOUNTER — Encounter: Payer: Self-pay | Admitting: Clinical

## 2024-06-22 ENCOUNTER — Ambulatory Visit (INDEPENDENT_AMBULATORY_CARE_PROVIDER_SITE_OTHER): Admitting: Clinical

## 2024-06-22 DIAGNOSIS — F410 Panic disorder [episodic paroxysmal anxiety] without agoraphobia: Secondary | ICD-10-CM | POA: Diagnosis not present

## 2024-06-22 DIAGNOSIS — F411 Generalized anxiety disorder: Secondary | ICD-10-CM

## 2024-06-22 NOTE — Progress Notes (Signed)
   Darice Seats, LCSW
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
## 2024-06-27 DIAGNOSIS — R7303 Prediabetes: Secondary | ICD-10-CM | POA: Diagnosis not present

## 2024-06-27 DIAGNOSIS — E6609 Other obesity due to excess calories: Secondary | ICD-10-CM | POA: Diagnosis not present

## 2024-06-27 DIAGNOSIS — Z6834 Body mass index (BMI) 34.0-34.9, adult: Secondary | ICD-10-CM | POA: Diagnosis not present

## 2024-06-27 DIAGNOSIS — E66811 Obesity, class 1: Secondary | ICD-10-CM | POA: Diagnosis not present

## 2024-07-05 DIAGNOSIS — E782 Mixed hyperlipidemia: Secondary | ICD-10-CM | POA: Diagnosis not present

## 2024-07-05 DIAGNOSIS — R7303 Prediabetes: Secondary | ICD-10-CM | POA: Diagnosis not present

## 2024-07-05 DIAGNOSIS — N182 Chronic kidney disease, stage 2 (mild): Secondary | ICD-10-CM | POA: Diagnosis not present

## 2024-07-05 DIAGNOSIS — N1831 Chronic kidney disease, stage 3a: Secondary | ICD-10-CM | POA: Diagnosis not present

## 2024-07-05 DIAGNOSIS — E291 Testicular hypofunction: Secondary | ICD-10-CM | POA: Diagnosis not present

## 2024-07-05 DIAGNOSIS — I10 Essential (primary) hypertension: Secondary | ICD-10-CM | POA: Diagnosis not present

## 2024-07-05 DIAGNOSIS — D352 Benign neoplasm of pituitary gland: Secondary | ICD-10-CM | POA: Diagnosis not present

## 2024-07-09 ENCOUNTER — Other Ambulatory Visit (HOSPITAL_COMMUNITY): Payer: Self-pay

## 2024-07-10 ENCOUNTER — Other Ambulatory Visit: Payer: Self-pay

## 2024-07-11 ENCOUNTER — Other Ambulatory Visit: Payer: Self-pay

## 2024-07-11 ENCOUNTER — Other Ambulatory Visit (HOSPITAL_COMMUNITY): Payer: Self-pay

## 2024-07-11 MED ORDER — TESTOSTERONE 1.62 % TD GEL
3.0000 | Freq: Every day | TRANSDERMAL | 3 refills | Status: AC
  Filled 2024-07-11: qty 75, 20d supply, fill #0
  Filled 2024-08-27: qty 75, 20d supply, fill #1
  Filled 2024-09-20: qty 75, 20d supply, fill #2

## 2024-07-11 MED ORDER — EZETIMIBE 10 MG PO TABS
10.0000 mg | ORAL_TABLET | Freq: Every day | ORAL | 3 refills | Status: AC
  Filled 2024-07-11: qty 90, 90d supply, fill #0

## 2024-07-20 ENCOUNTER — Ambulatory Visit: Admitting: Clinical

## 2024-07-20 DIAGNOSIS — F411 Generalized anxiety disorder: Secondary | ICD-10-CM | POA: Diagnosis not present

## 2024-07-20 NOTE — Progress Notes (Unsigned)
   Darice Seats, LCSW

## 2024-07-20 NOTE — Progress Notes (Signed)
 [facility] Counselor/Therapist Progress Note  Patient ID: Joel Marshall, MRN: [id number],    Date: 07/20/2024  Time Spent: 2:35pm - 3:38pm : 63 minutes   Treatment Type: Individual Therapy  Reported Symptoms: fluctuations in mood  Mental Status Exam: Appearance:  Neat and Well Groomed     Behavior: Appropriate  Motor: Normal  Speech/Language:  Clear and Coherent and Normal Rate  Affect: Appropriate  Mood: normal  Thought process: normal  Thought content:   WNL  Sensory/Perceptual disturbances:   WNL  Orientation: oriented to person, place, time/date, and situation  Attention: Good  Concentration: Good  Memory: WNL  Fund of knowledge:  Good  Insight:   Good  Judgment:  Good  Impulse Control: Good   Risk Assessment: Danger to Self:  No Patient denied current suicidal ideation  Self-injurious Behavior: No Danger to Others: No Patient denied current homicidal ideation Duty to Warn:no Physical Aggression / Violence:No  Access to Firearms a concern: No  Gang Involvement:No   Subjective:  Patient reported about the same in response to events since last session. Patient stated, all over the place in response to mood since last session.  Patient reported patient called his mother on mother's birthday and sent father a text message on his birthday. Patient reported father did not respond to patient's text message and acknowledged receipt of patient's text message during patient's phone conversation with father. Patient stated, It was amicable in reference to call with mother. Patient reported patient's conversation with father was brief. Patient stated, the general malaise of the holidays, loss of job around holidays, vehicle repairs last year prior to the holidays are triggers for fluctuations in mood. Patient reported feeling patient experiences symptoms associated with autism spectrum disorder, OCD, paranoia, schizophrenia.  Patient reported feeling a lack of  support and lack of guidance in letter to mother and father. Patient stated, I'm tired of feeling behind in reference to comparison to others. Patient stated, my whole life Ive done everything to live with everybody's standard. Patient stated, I didn't want to be institutionalized and reported fear of being institutionalized contributed to patient's decisions/behaviors. Patient stated, I've been alone for so long and it scares me. Patient reported feeling alone at times. Patient stated, did I really make the right decision by the people who are in my life, are they really going to be there for me if I have to go through stuff or are they going to abandon me in response to trigger for feeling alone.   Interventions: Cognitive Behavioral Therapy. Clinician conducted session in person at clinician's office at [facility]. Reviewed events since last session and assessed for changes. Discussed recent interactions with parents and patient's thoughts and feelings related to father's response. Challenged thought to assist patient in reframing thought (assumption) associated with father's response. Explored and identified triggers for fluctuations in mood and feeling alone. Reviewed patient's homework/letter to father and patient's thoughts/feelings in response to letter.  Explored fear of be institutionalized and feeling alone. Provided psycho education related to schizophrenia, OCD, paranoia.   Collaboration of Care: not required at this time   Diagnosis:  Generalized anxiety disorder with panic attacks R/O ADHD     Plan: Patient is to utilize [employer] Therapy, thought re-framing, relaxation techniques, healthy communication strategies, and coping strategies to decrease symptoms associated with their diagnosis. Frequency: bi-weekly  Modality: individual      Long-term goal:   Reduce overall level, frequency, and intensity of the feelings of anxiety and  anger as evidenced  by decrease in panic attacks, irritability, easily agitated, fluctuation in appetite, difficulty falling asleep and staying asleep, difficulty concentrating, feeling on edge, restlessness,  worry, difficulty controlling the worry, easily distracted, always fidgety, difficulty completing tasks, difficulty with organization, and difficulty staying on tasks from 7 days/week to 0 to 1 days/week per patient report for at least 3 consecutive months. Target Date: 05/05/25  Progress: progressing    Short-term goal:  Identify triggers for anger/irritability   Target Date: 11/03/23  Progress: patient reported he feels this goal has been met    develop and implement coping strategies to utilize in response to feelings of anger/irritability to reduce anger outbursts per patient's report   Target Date: 05/05/25  Progress: progressing    Develop and implement healthy communication strategies for patient to utilize when expressing his thoughts and feelings to others in a controlled and assertive way   Target Date: 05/05/25  Progress: progressing    Identify, challenge, and replace negative core beliefs, thought patterns, and negative self talk that contribute to feelings of anger, anxiety, and low self confidence with positive thoughts, beliefs, and positive self talk per patient's report  Target Date: 05/05/25  Progress: progressing    Develop, establish, and maintain healthy boundaries with others   Target Date: 05/05/25  Progress: progressing    Darice Seats, LCSW

## 2024-08-01 DIAGNOSIS — Z6834 Body mass index (BMI) 34.0-34.9, adult: Secondary | ICD-10-CM | POA: Diagnosis not present

## 2024-08-01 DIAGNOSIS — E66811 Obesity, class 1: Secondary | ICD-10-CM | POA: Diagnosis not present

## 2024-08-03 ENCOUNTER — Ambulatory Visit: Admitting: Clinical

## 2024-08-03 ENCOUNTER — Other Ambulatory Visit (HOSPITAL_COMMUNITY): Payer: Self-pay

## 2024-08-03 DIAGNOSIS — F410 Panic disorder [episodic paroxysmal anxiety] without agoraphobia: Secondary | ICD-10-CM | POA: Diagnosis not present

## 2024-08-03 DIAGNOSIS — F411 Generalized anxiety disorder: Secondary | ICD-10-CM

## 2024-08-03 NOTE — Progress Notes (Signed)
   Darice Seats, LCSW

## 2024-08-03 NOTE — Progress Notes (Signed)
 [facility] Counselor/Therapist Progress Note  Patient ID: Joel Hedeen, MRN: [id number],    Date: 08/03/2024  Time Spent: 2:33pm - 3:20pm :  47 minutes  Treatment Type: Individual Therapy  Reported Symptoms: sadness, anger  Mental Status Exam: Appearance:  Neat and Well Groomed     Behavior: Appropriate  Motor: Normal  Speech/Language:  Clear and Coherent and Normal Rate  Affect: Appropriate  Mood: sad  Thought process: normal  Thought content:   WNL  Sensory/Perceptual disturbances:   WNL  Orientation: oriented to person, place, time/date, and situation  Attention: Good  Concentration: Good  Memory: WNL  Fund of knowledge:  Good  Insight:   Good  Judgment:  Good  Impulse Control: Good   Risk Assessment: Danger to Self:  No Patient denied current suicidal ideation  Self-injurious Behavior: No Danger to Others: No Patient denied current homicidal ideation Duty to Warn:no Physical Aggression / Violence:No  Access to Firearms a concern: No  Gang Involvement:No   Subjective: Patient reported patient's wife agreed to dog sit for a co-worker and patient reported feeling wife did not provide patient with advanced notice. Patient stated, that shows lack of respect in reference to patient's feelings. Patient reported feeling you (wife) didn't think this through before you agreed to do this in reference to dog sitting. Patient reported feeling dog siting is disrupting family's routine. Patient stated, I should have a say, I had none in reference to dog sitting. Patient reported feeling angry regarding wife agreeing to dog sit. Patient stated, I'm not being respected in my own home. Patient stated, I feel she's already checked out and reported wife has stated she was going to leave or ask patient to leave. Patient stated, did I really get married because I wanted to or was it another thing on the to do list.  Patient stated, I just want peace and I can not get  that at home. Patient reported, It don't matter what I do its never enough. Patient reported feelings of sadness today and this week due to loss of two co-workers. Patient stated, heavy two weeks really, Its all taken a toll in reference to dog sitting and losses.   Interventions: Cognitive Behavioral Therapy and Interpersonal. Clinician conducted session in person at clinician's office at [facility]. Reviewed events since last session and assessed for changes. Discussed conflict with wife. Explored thoughts and feelings triggered by conflict with wife. Discussed patient's concerns regarding patient's marriage. Discussed recommendation for marriage counseling. Provided psycho education related to fair fighting rules, such as, taking a time out and identifying time to return. Provided supportive therapy and active listening as patient discussed recent losses of co-workers.    Collaboration of Care: not required at this time   Diagnosis:  Generalized anxiety disorder with panic attacks R/O ADHD     Plan: Patient is to utilize [employer] Therapy, thought re-framing, relaxation techniques, healthy communication strategies, and coping strategies to decrease symptoms associated with their diagnosis. Frequency: bi-weekly  Modality: individual      Long-term goal:   Reduce overall level, frequency, and intensity of the feelings of anxiety and anger as evidenced by decrease in panic attacks, irritability, easily agitated, fluctuation in appetite, difficulty falling asleep and staying asleep, difficulty concentrating, feeling on edge, restlessness,  worry, difficulty controlling the worry, easily distracted, always fidgety, difficulty completing tasks, difficulty with organization, and difficulty staying on tasks from 7 days/week to 0 to 1 days/week per patient report for at least 3  consecutive months. Target Date: 05/05/25  Progress: progressing    Short-term goal:  Identify  triggers for anger/irritability   Target Date: 11/03/23  Progress: patient reported he feels this goal has been met    develop and implement coping strategies to utilize in response to feelings of anger/irritability to reduce anger outbursts per patient's report   Target Date: 05/05/25  Progress: progressing    Develop and implement healthy communication strategies for patient to utilize when expressing his thoughts and feelings to others in a controlled and assertive way   Target Date: 05/05/25  Progress: progressing    Identify, challenge, and replace negative core beliefs, thought patterns, and negative self talk that contribute to feelings of anger, anxiety, and low self confidence with positive thoughts, beliefs, and positive self talk per patient's report  Target Date: 05/05/25  Progress: progressing    Develop, establish, and maintain healthy boundaries with others   Target Date: 05/05/25  Progress: progressing       Darice Seats, LCSW

## 2024-08-08 DIAGNOSIS — N182 Chronic kidney disease, stage 2 (mild): Secondary | ICD-10-CM | POA: Diagnosis not present

## 2024-08-28 ENCOUNTER — Other Ambulatory Visit: Payer: Self-pay

## 2024-09-07 ENCOUNTER — Ambulatory Visit: Admitting: Clinical

## 2024-09-07 DIAGNOSIS — F411 Generalized anxiety disorder: Secondary | ICD-10-CM

## 2024-09-07 NOTE — Progress Notes (Unsigned)
 "  [facility] Counselor/Therapist Progress Note  Patient ID: Joel Marshall, MRN: [id number],    Date: 09/07/2024  Time Spent: 2:35pm - 3:34pm : 59 minutes   Treatment Type: Individual Therapy  Reported Symptoms: Patient reported recent fluctuations in mood, fatigue  Mental Status Exam: Appearance:  Neat and Well Groomed     Behavior: Appropriate  Motor: Normal  Speech/Language:  Clear and Coherent and Normal Rate  Affect: Appropriate  Mood: Patient stated, about like the weather blah in response to current mood.  Thought process: normal  Thought content:   WNL  Sensory/Perceptual disturbances:   WNL  Orientation: oriented to person, place, time/date, and situation  Attention: Good  Concentration: Good  Memory: WNL  Fund of knowledge:  Good  Insight:   Good  Judgment:  Good  Impulse Control: Good   Risk Assessment: Danger to Self:  No Patient denied current suicidal ideation  Self-injurious Behavior: No Danger to Others: No Patient denied current homicidal ideation Duty to Warn:no Physical Aggression / Violence:No  Access to Firearms a concern: No  Gang Involvement:No   Subjective:  Patient stated, they've been going in response to events since last session. Patient stated, taking everything a day at a time.  Patient stated, all over the place in response to mood since last session. Patient stated, I had the kids on Monday, this weather, the current geopolitical climate, and reported recent conflict with wife in response to triggers for recent fluctuations in mood. Patient reported patient/wife were able to resolve recent conflict. Patient reported fatigue and reported nephrologist recently diagnosed patient with stage 2 chronic kidney disease. Patient reported another thing to stress me out in response to medical diagnosis.  Patient stated, about like the weather blah in response to current mood. Patient stated, I always practice fair fighting  rules and provided examples of patient's practice of fair fighting rules. Patient reported difficulty practicing taking a time out during conflict with wife. During today's session, patient vocalized patient's thoughts/feelings regarding patient's parents during empty chair exercise. Patient stated, its obvious that my dad didn't want me and stated, it hurts to not be wanted.   Interventions: Cognitive Behavioral Therapy and Gestalt/Psychodrama. Clinician conducted session in person at clinician's office at [facility]. Reviewed events since last session and assessed for changes. Explored and identified triggers for recent fluctuations in mood. Discussed recent medical diagnosis and explored patient's thoughts/feelings regarding diagnosis. Reviewed fair fighting rules and discussed patient's practice of fair fighting rules. Explored and identified barriers to practicing fair fighting rules. Utilized empty chair technique and provided therapeutic space for patient to vocalize patient's thoughts/feelings to patient's parents. Provided psycho education related to generating alternatives and assisted patient in generating alternatives related to father's responses to patient.    Collaboration of Care: not required at this time   Diagnosis:  Generalized anxiety disorder with panic attacks R/O ADHD     Plan: Patient is to utilize [employer] Therapy, thought re-framing, relaxation techniques, healthy communication strategies, and coping strategies to decrease symptoms associated with their diagnosis. Frequency: bi-weekly  Modality: individual      Long-term goal:   Reduce overall level, frequency, and intensity of the feelings of anxiety and anger as evidenced by decrease in panic attacks, irritability, easily agitated, fluctuation in appetite, difficulty falling asleep and staying asleep, difficulty concentrating, feeling on edge, restlessness,  worry, difficulty controlling the  worry, easily distracted, always fidgety, difficulty completing tasks, difficulty with organization, and difficulty staying on tasks from  7 days/week to 0 to 1 days/week per patient report for at least 3 consecutive months. Target Date: 05/05/25  Progress: progressing    Short-term goal:  Identify triggers for anger/irritability   Target Date: 11/03/23  Progress: patient reported he feels this goal has been met    develop and implement coping strategies to utilize in response to feelings of anger/irritability to reduce anger outbursts per patient's report   Target Date: 05/05/25  Progress: progressing    Develop and implement healthy communication strategies for patient to utilize when expressing his thoughts and feelings to others in a controlled and assertive way   Target Date: 05/05/25  Progress: progressing    Identify, challenge, and replace negative core beliefs, thought patterns, and negative self talk that contribute to feelings of anger, anxiety, and low self confidence with positive thoughts, beliefs, and positive self talk per patient's report  Target Date: 05/05/25  Progress: progressing    Develop, establish, and maintain healthy boundaries with others   Target Date: 05/05/25  Progress: progressing         Darice Seats, LCSW    "

## 2024-09-07 NOTE — Progress Notes (Unsigned)
   Darice Seats, LCSW

## 2024-09-14 ENCOUNTER — Ambulatory Visit: Admitting: Clinical

## 2024-09-14 DIAGNOSIS — F411 Generalized anxiety disorder: Secondary | ICD-10-CM | POA: Diagnosis not present

## 2024-09-14 NOTE — Progress Notes (Signed)
   Darice Seats, LCSW

## 2024-09-14 NOTE — Progress Notes (Signed)
 "  [facility] Counselor/Therapist Progress Note  Patient ID: Joel Marshall, MRN: [id number],    Date: 09/14/2024  Time Spent: 2:33pm - 3:19pm : 46 minutes   Treatment Type: Individual Therapy  Reported Symptoms: none reported  Mental Status Exam: Appearance:  Neat and Well Groomed     Behavior: Appropriate  Motor: Normal  Speech/Language:  Clear and Coherent and Normal Rate  Affect: Appropriate  Mood: normal  Thought process: normal  Thought content:   WNL  Sensory/Perceptual disturbances:   WNL  Orientation: oriented to person, place, time/date, and situation  Attention: Good  Concentration: Good  Memory: WNL  Fund of knowledge:  Good  Insight:   Good  Judgment:  Good  Impulse Control: Good   Risk Assessment: Danger to Self:  No Patient denied current suicidal ideation  Self-injurious Behavior: No Danger to Others: No Patient denied current homicidal ideation Duty to Warn:no Physical Aggression / Violence:No  Access to Firearms a concern: No  Gang Involvement:No   Subjective: Patient stated, I'm feeling pretty good today. Patient reported patient has received multiple birthday acknowledgments today. Patient stated, I'm actually shocked because most people don't remember my birthday. Patient reported patient has received birthday acknowledgements from individuals patient did not expect. Patient reported I wasn't expecting it to in reference to patient's birthday going well. Patient stated, it's been for the most part pretty good in reference to birthday. Patient stated, people I didn't expect to remember remembered. Patient reported patient's director wished patient happy birthday and patient was not expecting director's response. Patient stated, its mostly been positives today. Patient reported no traffic issues today, patient had breakfast before leaving patient's house, and patient saved money eating at home as additional positives today.      Interventions: Cognitive Behavioral Therapy. Clinician conducted session in person at clinician's office at [facility]. Reviewed events since last session and assessed for changes. Discussed patient's request to schedule session on patient's birthday and previous birthday experiences. Explored patient's plans to celebrate birthday. Discussed identifying positives in response to work related stressors. Assisted patient in exploring and identifying positive aspects related to patient's birthday today.    Collaboration of Care: not required at this time   Diagnosis:  Generalized anxiety disorder with panic attacks R/O ADHD     Plan: Patient is to utilize [employer] Therapy, thought re-framing, relaxation techniques, healthy communication strategies, and coping strategies to decrease symptoms associated with their diagnosis. Frequency: bi-weekly  Modality: individual      Long-term goal:   Reduce overall level, frequency, and intensity of the feelings of anxiety and anger as evidenced by decrease in panic attacks, irritability, easily agitated, fluctuation in appetite, difficulty falling asleep and staying asleep, difficulty concentrating, feeling on edge, restlessness,  worry, difficulty controlling the worry, easily distracted, always fidgety, difficulty completing tasks, difficulty with organization, and difficulty staying on tasks from 7 days/week to 0 to 1 days/week per patient report for at least 3 consecutive months. Target Date: 05/05/25  Progress: progressing    Short-term goal:  Identify triggers for anger/irritability   Target Date: 11/03/23  Progress: patient reported he feels this goal has been met    develop and implement coping strategies to utilize in response to feelings of anger/irritability to reduce anger outbursts per patient's report   Target Date: 05/05/25  Progress: progressing    Develop and implement healthy communication strategies for patient  to utilize when expressing his thoughts and feelings to others in a controlled and  assertive way   Target Date: 05/05/25  Progress: progressing    Identify, challenge, and replace negative core beliefs, thought patterns, and negative self talk that contribute to feelings of anger, anxiety, and low self confidence with positive thoughts, beliefs, and positive self talk per patient's report  Target Date: 05/05/25  Progress: progressing    Develop, establish, and maintain healthy boundaries with others   Target Date: 05/05/25  Progress: progressing       Darice Seats, LCSW    "

## 2024-09-21 ENCOUNTER — Other Ambulatory Visit: Payer: Self-pay

## 2024-09-21 ENCOUNTER — Other Ambulatory Visit (HOSPITAL_COMMUNITY): Payer: Self-pay

## 2024-09-28 ENCOUNTER — Ambulatory Visit: Admitting: Clinical

## 2024-10-19 ENCOUNTER — Ambulatory Visit: Admitting: Clinical

## 2024-11-02 ENCOUNTER — Ambulatory Visit: Admitting: Clinical

## 2025-01-24 ENCOUNTER — Encounter: Admitting: Physician Assistant
